# Patient Record
Sex: Female | Born: 1935 | Race: White | Hispanic: No | Smoking: Never smoker
Health system: Southern US, Community
[De-identification: ages and names within clinical notes are randomized; demographics above are authoritative.]

## PROBLEM LIST (undated history)

## (undated) DIAGNOSIS — J3800 Paralysis of vocal cords and larynx, unspecified: Secondary | ICD-10-CM

## (undated) DIAGNOSIS — K219 Gastro-esophageal reflux disease without esophagitis: Secondary | ICD-10-CM

## (undated) DIAGNOSIS — E039 Hypothyroidism, unspecified: Secondary | ICD-10-CM

## (undated) DIAGNOSIS — E063 Autoimmune thyroiditis: Secondary | ICD-10-CM

## (undated) DIAGNOSIS — J449 Chronic obstructive pulmonary disease, unspecified: Secondary | ICD-10-CM

## (undated) DIAGNOSIS — I10 Essential (primary) hypertension: Secondary | ICD-10-CM

## (undated) DIAGNOSIS — F418 Other specified anxiety disorders: Secondary | ICD-10-CM

## (undated) HISTORY — PX: OTHER SURGICAL HISTORY: SHX169

## (undated) HISTORY — PX: APPENDECTOMY: SHX54

## (undated) HISTORY — DX: Autoimmune thyroiditis: E06.3

## (undated) HISTORY — PX: TUBAL LIGATION: SHX77

## (undated) HISTORY — PX: THYROIDECTOMY: SHX17

## (undated) HISTORY — PX: HEMORRHOID SURGERY: SHX153

## (undated) HISTORY — DX: Paralysis of vocal cords and larynx, unspecified: J38.00

---

## 1998-02-26 ENCOUNTER — Other Ambulatory Visit: Admission: RE | Admit: 1998-02-26 | Discharge: 1998-02-26 | Payer: Self-pay | Admitting: Obstetrics and Gynecology

## 1999-03-02 ENCOUNTER — Other Ambulatory Visit: Admission: RE | Admit: 1999-03-02 | Discharge: 1999-03-02 | Payer: Self-pay | Admitting: Obstetrics and Gynecology

## 1999-06-29 ENCOUNTER — Ambulatory Visit (HOSPITAL_COMMUNITY): Admission: RE | Admit: 1999-06-29 | Discharge: 1999-06-29 | Payer: Self-pay | Admitting: Internal Medicine

## 1999-06-29 ENCOUNTER — Encounter: Payer: Self-pay | Admitting: Internal Medicine

## 2000-02-04 ENCOUNTER — Encounter: Payer: Self-pay | Admitting: Internal Medicine

## 2000-02-04 ENCOUNTER — Ambulatory Visit (HOSPITAL_COMMUNITY): Admission: RE | Admit: 2000-02-04 | Discharge: 2000-02-04 | Payer: Self-pay | Admitting: Internal Medicine

## 2000-03-09 ENCOUNTER — Other Ambulatory Visit: Admission: RE | Admit: 2000-03-09 | Discharge: 2000-03-09 | Payer: Self-pay | Admitting: Obstetrics and Gynecology

## 2001-03-08 ENCOUNTER — Other Ambulatory Visit: Admission: RE | Admit: 2001-03-08 | Discharge: 2001-03-08 | Payer: Self-pay | Admitting: Obstetrics and Gynecology

## 2001-03-27 ENCOUNTER — Ambulatory Visit (HOSPITAL_COMMUNITY): Admission: RE | Admit: 2001-03-27 | Discharge: 2001-03-27 | Payer: Self-pay | Admitting: *Deleted

## 2001-03-27 ENCOUNTER — Encounter (INDEPENDENT_AMBULATORY_CARE_PROVIDER_SITE_OTHER): Payer: Self-pay | Admitting: Specialist

## 2001-04-16 ENCOUNTER — Encounter (INDEPENDENT_AMBULATORY_CARE_PROVIDER_SITE_OTHER): Payer: Self-pay | Admitting: Specialist

## 2001-04-16 ENCOUNTER — Ambulatory Visit (HOSPITAL_COMMUNITY): Admission: RE | Admit: 2001-04-16 | Discharge: 2001-04-16 | Payer: Self-pay | Admitting: Obstetrics and Gynecology

## 2002-02-26 ENCOUNTER — Other Ambulatory Visit: Admission: RE | Admit: 2002-02-26 | Discharge: 2002-02-26 | Payer: Self-pay | Admitting: Gynecology

## 2004-03-04 ENCOUNTER — Other Ambulatory Visit: Admission: RE | Admit: 2004-03-04 | Discharge: 2004-03-04 | Payer: Self-pay | Admitting: Gynecology

## 2004-03-08 ENCOUNTER — Encounter: Admission: RE | Admit: 2004-03-08 | Discharge: 2004-03-08 | Payer: Self-pay | Admitting: Gynecology

## 2004-03-30 ENCOUNTER — Encounter: Admission: RE | Admit: 2004-03-30 | Discharge: 2004-03-30 | Payer: Self-pay | Admitting: Gynecology

## 2004-04-28 ENCOUNTER — Ambulatory Visit (HOSPITAL_COMMUNITY): Admission: RE | Admit: 2004-04-28 | Discharge: 2004-04-28 | Payer: Self-pay | Admitting: *Deleted

## 2004-08-31 ENCOUNTER — Ambulatory Visit: Payer: Self-pay | Admitting: Internal Medicine

## 2005-02-14 ENCOUNTER — Ambulatory Visit: Payer: Self-pay | Admitting: Internal Medicine

## 2005-03-16 ENCOUNTER — Ambulatory Visit: Payer: Self-pay | Admitting: Internal Medicine

## 2005-04-05 ENCOUNTER — Encounter: Admission: RE | Admit: 2005-04-05 | Discharge: 2005-04-05 | Payer: Self-pay | Admitting: Gynecology

## 2005-07-14 ENCOUNTER — Ambulatory Visit: Payer: Self-pay | Admitting: Internal Medicine

## 2005-09-08 ENCOUNTER — Ambulatory Visit: Payer: Self-pay | Admitting: Internal Medicine

## 2005-09-27 ENCOUNTER — Ambulatory Visit: Payer: Self-pay | Admitting: Internal Medicine

## 2005-10-09 ENCOUNTER — Ambulatory Visit: Payer: Self-pay | Admitting: Internal Medicine

## 2006-03-19 ENCOUNTER — Ambulatory Visit: Payer: Self-pay | Admitting: Internal Medicine

## 2006-04-03 ENCOUNTER — Other Ambulatory Visit: Admission: RE | Admit: 2006-04-03 | Discharge: 2006-04-03 | Payer: Self-pay | Admitting: Gynecology

## 2006-04-26 ENCOUNTER — Encounter: Admission: RE | Admit: 2006-04-26 | Discharge: 2006-04-26 | Payer: Self-pay | Admitting: Gynecology

## 2006-05-04 ENCOUNTER — Encounter: Admission: RE | Admit: 2006-05-04 | Discharge: 2006-05-04 | Payer: Self-pay | Admitting: Gynecology

## 2006-06-12 ENCOUNTER — Ambulatory Visit: Payer: Self-pay | Admitting: Internal Medicine

## 2006-10-11 ENCOUNTER — Ambulatory Visit: Payer: Self-pay | Admitting: Internal Medicine

## 2006-11-30 ENCOUNTER — Encounter: Admission: RE | Admit: 2006-11-30 | Discharge: 2006-11-30 | Payer: Self-pay | Admitting: Family Medicine

## 2007-01-01 ENCOUNTER — Ambulatory Visit: Payer: Self-pay | Admitting: Internal Medicine

## 2007-01-01 ENCOUNTER — Ambulatory Visit: Payer: Self-pay | Admitting: Cardiology

## 2007-01-01 LAB — CONVERTED CEMR LAB
GFR calc Af Amer: 105 mL/min
GFR calc non Af Amer: 87 mL/min
Potassium: 4 meq/L (ref 3.5–5.1)
Sodium: 139 meq/L (ref 135–145)

## 2007-05-08 ENCOUNTER — Encounter: Admission: RE | Admit: 2007-05-08 | Discharge: 2007-05-08 | Payer: Self-pay | Admitting: Gynecology

## 2007-08-28 ENCOUNTER — Telehealth: Payer: Self-pay | Admitting: Internal Medicine

## 2007-09-10 DIAGNOSIS — J3800 Paralysis of vocal cords and larynx, unspecified: Secondary | ICD-10-CM | POA: Insufficient documentation

## 2007-09-10 DIAGNOSIS — E063 Autoimmune thyroiditis: Secondary | ICD-10-CM | POA: Insufficient documentation

## 2007-09-10 DIAGNOSIS — J158 Pneumonia due to other specified bacteria: Secondary | ICD-10-CM | POA: Insufficient documentation

## 2007-09-10 DIAGNOSIS — J479 Bronchiectasis, uncomplicated: Secondary | ICD-10-CM

## 2007-10-08 ENCOUNTER — Encounter: Payer: Self-pay | Admitting: Internal Medicine

## 2008-05-08 ENCOUNTER — Encounter: Admission: RE | Admit: 2008-05-08 | Discharge: 2008-05-08 | Payer: Self-pay | Admitting: Gynecology

## 2008-07-08 ENCOUNTER — Telehealth (INDEPENDENT_AMBULATORY_CARE_PROVIDER_SITE_OTHER): Payer: Self-pay | Admitting: *Deleted

## 2008-07-11 ENCOUNTER — Encounter: Payer: Self-pay | Admitting: Internal Medicine

## 2008-07-11 ENCOUNTER — Emergency Department (HOSPITAL_COMMUNITY): Admission: EM | Admit: 2008-07-11 | Discharge: 2008-07-11 | Payer: Self-pay | Admitting: Emergency Medicine

## 2008-07-13 ENCOUNTER — Telehealth (INDEPENDENT_AMBULATORY_CARE_PROVIDER_SITE_OTHER): Payer: Self-pay | Admitting: *Deleted

## 2008-08-26 ENCOUNTER — Encounter: Payer: Self-pay | Admitting: Internal Medicine

## 2008-10-01 ENCOUNTER — Ambulatory Visit: Payer: Self-pay | Admitting: Internal Medicine

## 2008-10-23 ENCOUNTER — Telehealth (INDEPENDENT_AMBULATORY_CARE_PROVIDER_SITE_OTHER): Payer: Self-pay | Admitting: *Deleted

## 2008-11-12 ENCOUNTER — Encounter: Admission: RE | Admit: 2008-11-12 | Discharge: 2008-11-12 | Payer: Self-pay | Admitting: Family Medicine

## 2009-02-08 ENCOUNTER — Ambulatory Visit: Payer: Self-pay | Admitting: Internal Medicine

## 2009-02-08 ENCOUNTER — Telehealth (INDEPENDENT_AMBULATORY_CARE_PROVIDER_SITE_OTHER): Payer: Self-pay | Admitting: *Deleted

## 2009-02-15 ENCOUNTER — Telehealth (INDEPENDENT_AMBULATORY_CARE_PROVIDER_SITE_OTHER): Payer: Self-pay | Admitting: *Deleted

## 2009-05-28 ENCOUNTER — Encounter: Admission: RE | Admit: 2009-05-28 | Discharge: 2009-05-28 | Payer: Self-pay | Admitting: Gynecology

## 2009-07-15 ENCOUNTER — Telehealth: Payer: Self-pay | Admitting: Internal Medicine

## 2009-10-04 ENCOUNTER — Ambulatory Visit: Payer: Self-pay | Admitting: Internal Medicine

## 2009-10-11 ENCOUNTER — Telehealth (INDEPENDENT_AMBULATORY_CARE_PROVIDER_SITE_OTHER): Payer: Self-pay | Admitting: *Deleted

## 2010-04-04 ENCOUNTER — Ambulatory Visit: Payer: Self-pay | Admitting: Internal Medicine

## 2010-06-01 ENCOUNTER — Encounter: Payer: Self-pay | Admitting: Internal Medicine

## 2010-06-07 ENCOUNTER — Encounter: Admission: RE | Admit: 2010-06-07 | Discharge: 2010-06-07 | Payer: Self-pay | Admitting: Gynecology

## 2010-07-30 ENCOUNTER — Encounter: Payer: Self-pay | Admitting: Gynecology

## 2010-08-11 NOTE — Assessment & Plan Note (Signed)
Summary: 6 months/apc   Primary Provider/Referring Provider:  [facility]or[facility]  CC:  6 month follow up visit-Increased SOB and Increased weakness-trying to walk more.[name]  History of Present Illness: 02/08/09- Chronic bronchitis, Brochiectasis-MAIC, chronic right vocal cord paralysis 4 days- sore throat, laryngitis, no measured fever, cough productive dark phlegm. Caught from sick daughter. Taking tylenol and Tussionex.  October 04, 2009- Chronic bronchitis, bronchiectasis-MAIC, chronic right vocal cord paralysis Had laryngitis and a couple of colds. She doesn't know when she hd a pneumonia vaccine. She contnues to cough a lot, productive of white to greenish sputum with occasional streak of heme. this is unchanged and not progressive. No large amount of blood in last 6 months. Keeps pains across midthoracic back- not new. Has known osteoporosis. She denies easy choking while eating. denies any awareness of reflux or heartburn. She notes spring pollen rhinitis- postnasal drip with little sneeze. She is walking regularly. Denies dyspnea except on hills and stairs, sometimes still a hotflash. We discussed combination of Atrovent( rarely used rescue) and Spiriva (used daily).  April 04, 2010- Chronic bronchitis, bronchiectasis/ hx MAIC, chronic right vocal cord paralysis CXR- March, 2011- Chronic scarring changes. Started walking again, but heat and humidity have bothered her breathing. She went to an UC over a weekend in August with sore throat and rash, dx'd Strp throat.  The rash may have been from flea bites, while visiting her daughter.Took amoxacillin and prednisone. She asks about vitamins for her immune resistance. She feels her strength hasn't come back and with resumption of walking she notices the dyspnea with exertion. Her primary MD put her on Xanax, used only occasionally for nerves. Only a couple of small hemoptysis episodes since last here.      Preventive Screening-Counseling &  Management  Alcohol-Tobacco     Smoking Status: never     Passive Smoke Exposure: yes     Passive Smoke Counseling: to avoid passive smoke exposure  Current Medications (verified): 1)  Qvar 40 Mcg/act  Aers (Beclomethasone Dipropionate) .... 2 Puffs Two Times A Day 2)  Atrovent Hfa 17 Mcg/act  Aers (Ipratropium Bromide Hfa) .... Inhale 2 Puffs Four Times A Day As Needed 3)  Levothroid 88 Mcg  Tabs (Levothyroxine Sodium) .... Take 1 Tablet By Mouth Once A Day 4)  Spiriva Handihaler 18 Mcg  Caps (Tiotropium Bromide Monohydrate) .... Inhale Contents of 1 Capsule Once A Day 5)  Xanax 0.5 Mg Tabs (Alprazolam) .... Take 1/2 - 1 By Mouth Three Times A Day As Needed 6)  Vitamin D 2000 Unit Tabs (Cholecalciferol) .... Once Daily 7)  Glucosamine-Chondroitin 500-400 Mg  Tabs (Glucosamine-Chondroitin) .... Take 1 Tablet By Mouth Once A Day 8)  Citrical 1200mg  .... Take 1 Tablet By Mouth Once A Day 9)  Centrum Silver   Tabs (Multiple Vitamins-Minerals) .... Take By Mouth 4 Times Weekly 10)  Metamucil .[name].. 1 Tsp Once Daily 11)  Premarin 0.625 Mg/gm  Crea (Estrogens, Conjugated) .... Use Twice A Week 12)  Gabapentin 100 Mg  Caps (Gabapentin) .... Take 2 Tabs By Mouth Three Times A Day 13)  Tylenol .... Take 1 Tab By Mouth Every 8 Hours As Needed 14)  Diovan Hct 320-25 Mg Tabs (Valsartan-Hydrochlorothiazide) .... Take 1 By Mouth Once Daily 15)  Amlodipine Besylate 5 Mg Tabs (Amlodipine Besylate) .... Take 1 By Mouth Once Daily 16)  Cyclobenzaprine Hcl 5 Mg Tabs (Cyclobenzaprine Hcl) .... Take 1-3 Tabs Daily 17)  Boniva 150 Mg Tabs (Ibandronate Sodium) .... Take 1 By Mouth  Weekly 18)  Hydrocodone-Acetaminophen 5-325 Mg Tabs (Hydrocodone-Acetaminophen) .... As Needed 19)  Tylenol Arthritis Pain 650 Mg Cr-Tabs (Acetaminophen) .... Per Bottle  Allergies (verified): 1)  ! 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 hemorrhoidectomy  Family History: Last updated: 2008-10-10 Mother - died rheumatic fever complc Father- died HTN, COPD  Social History: Last updated: 10/10/2008 Patient never smoked. Positive history of passive tobacco smoke exposure. husband and father widowed  Risk Factors: Smoking Status: never (04/04/2010) Passive Smoke Exposure: yes (04/04/2010)  Review of Systems      See HPI       The patient complains of productive cough.  The patient denies shortness of breath with activity, shortness of breath at rest, non-productive cough, coughing up blood, chest pain, irregular heartbeats, acid heartburn, indigestion, loss of appetite, weight change, abdominal pain, difficulty swallowing, sore throat, tooth/dental problems, headaches, nasal congestion/difficulty breathing through nose, sneezing, itching, change in color of mucus, and fever.    Vital Signs:  Patient profile:   77 year old female Height:      63 inches Weight:      115.38 pounds BMI:     20.51 O2 Sat:      94 % on Room air Pulse rate:   79 / minute BP sitting:   110 / 78  (left arm) Cuff size:   regular  Vitals Entered By: Reynaldo Minium CMA (April 04, 2010 10:49 AM)  O2 Flow:  Room air CC: 6 month follow up visit-Increased SOB and Increased weakness-trying to walk more.   Physical Exam  Additional Exam:  General: A/Ox3; pleasant and cooperative, NAD, slim SKIN: no rash, lesions NODES: no lymphadenopathy HEENT: [place]/AT, EOM- WNL, Conjuctivae- clear, PERRLA, TM scarring without obstruction. Right side looks cloudy and retractrd, erythema or fluid., Nose- clear, Throat- clear and wnl, Mallampati II, chronically hoarse/ strained vocal quality without  stridor NECK: Supple w/ fair ROM, JVD- none, normal carotid impulses w/o bruits Thyroid CHEST: few faint scattered crackles, barely audible with no cough while here HEART: RRR, no m/g/r heard ABDOMEN- soft EAV:WUJW, nl pulses, no edema  NEURO: Grossly intact to observation      CXR  Procedure date:  10/04/2009  Findings:      DG CHEST 2 VIEW - [id number]   Clinical Data: Cough, shortness of breath.   CHEST - 2 VIEW   Comparison: 11/12/2008   Findings: Nodular densities are seen throughout the lungs, particularly left lung, likely calcified granulomas.  Scarring noted in the apices.  Heart is upper limits normal in size.  No effusions or acute bony abnormality.   IMPRESSION: Stable chronic changes.   No acute cardiopulmonary disease.   Read By:  Charlett Nose,  M.D.     Released By:  Charlett Nose,  M.D.  _____________________________________________________________________  External Attachment:    Type:     Image     Comment:  DG CHEST 2 VIEW - [id number]   Impression & Recommendations:  Problem # 1:  BRONCHIECTASIS (ICD-494.0) Encouraged to keep walking for endurance. We discussed winter weather protections. She had pneumovax last visit and wants Flu vax today. Has not had obvious recurrence of purulent bronchitis. Qvar helps and she needs refill.  Problem # 2:  VOCAL CORD PARALYSIS (ICD-478.30) She is managing this as well  as possible and avoiding frank aspriation. 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  Medications Added to Medication List This Visit: 1)  Xanax 0.5 Mg Tabs (Alprazolam) .... Take 1/2 - 1 by mouth three times a day as needed 2)  Boniva 150 Mg Tabs (Ibandronate sodium) .... Take 1 by mouth weekly 3)  Tussionex Pennkinetic Er 10-8 Mg/72ml Lqcr (Hydrocod polst-chlorphen  polst) .[name].. 1 teaspoon twice daily as needed cough  Other Orders: Future Orders: Flu Vaccine 52yrs + MEDICARE PATIENTS (M5784) ... 04/05/2010 Administration Flu vaccine - MCR (G0008) ... 04/05/2010  Patient Instructions: 1)  Please schedule a follow-up appointment in 6 months. 2)  Refill script for Qvar 40 3)  Flu vax 4)  Keep walking for endurance and strrength Prescriptions: TUSSIONEX PENNKINETIC ER 10-8 MG/5ML LQCR (HYDROCOD POLST-CHLORPHEN POLST) 1 teaspoon twice daily as needed cough  #200 ml x 0   Entered and Authorized by:   Waymon Budge MD   Signed by:   Waymon Budge MD on 04/04/2010   Method used:   Print then Give to Patient   RxID:   [id number] QVAR 40 MCG/ACT  AERS (BECLOMETHASONE DIPROPIONATE) 2 puffs two times a day  #3 x 3   Entered and Authorized by:   Waymon Budge MD   Signed by:   Waymon Budge MD on 04/04/2010   Method used:   Print then Give to Patient   RxID:   [id number]      CXR  Procedure date:  10/04/2009  Findings:      DG CHEST 2 VIEW - [id number]   Clinical Data: Cough, shortness of breath.   CHEST - 2 VIEW   Comparison: 11/12/2008   Findings: Nodular densities are seen throughout the lungs, particularly left lung, likely calcified granulomas.  Scarring noted in the apices.  Heart is upper limits normal in size.  No effusions or acute bony abnormality.   IMPRESSION: Stable chronic changes.   No acute cardiopulmonary disease.   Read By:  Charlett Nose,  M.D.     Released By:  Charlett Nose,  M.D.  _____________________________________________________________________  External Attachment:    Type:     Image     Comment:  DG CHEST 2 VIEW - [id number]   Flu Vaccine Consent Questions     Do you have a history of severe allergic reactions to this vaccine? no    Any prior history of allergic reactions to egg and/or gelatin? no    Do you have a sensitivity to the preservative Thimersol? no    Do you have a past  history of Guillan-Barre Syndrome? no    Do you currently have an acute febrile illness? no    Have you ever had a severe reaction to latex? no    Vaccine information given and explained to patient? yes    Are you currently pregnant? no    Lot Number:[id number]   Exp Date:01/07/2011   Site Given  Left Deltoid IMmedflu Vivianne Spence

## 2010-08-11 NOTE — Progress Notes (Signed)
Summary: rx  Phone Note Call from Patient Call back at Home Phone [phone]   Caller: Patient Call For: Hadiya Spoerl Reason for Call: Talk to Nurse Summary of Call: cough,congestion, cold, no fever, runny nose.  Wants to know if you will call in a zpac & Tussionex cough syrup(she requested refill on this from pharm). [facility] Initial call taken by: Eugene Gavia,  July 15, 2009 8:14 AM  Follow-up for Phone Call        please advise. Allergies: doxycycline. Carron Curie CMA  July 15, 2009 8:38 AM   Additional Follow-up for Phone Call Additional follow up Details #1::        Per CY- zpak, Tussionex  1 tsp two times a day as needed no ref. Zackery Barefoot CMA  July 15, 2009 9:11 AM     New/Updated Medications: Sandria Senter ER 8-10 MG/5ML  LQCR (CHLORPHENIRAMINE-HYDROCODONE) 1 teaspoon twice a day as needed Prescriptions: ZITHROMAX Z-PAK 250 MG TABS (AZITHROMYCIN) 2 today then one daily  #1 pak x 0   Entered by:   Carron Curie CMA   Authorized by:   Waymon Budge MD   Signed by:   Carron Curie CMA on 07/15/2009   Method used:   Telephoned to ...       [id number]       [address]       [place], [place]  [address]       Ph: [phone]       Fax: [phone]   RxID:   [id number] Sandria Senter ER 8-10 MG/5ML  LQCR (CHLORPHENIRAMINE-HYDROCODONE) 1 teaspoon twice a day as needed  #279ml x 0   Entered by:   Carron Curie CMA   Authorized by:   Waymon Budge MD   Signed by:   Carron Curie CMA on 07/15/2009   Method used:   Telephoned to ...       [id number]       [address]       [place], [place]  [address]       Ph: [phone]       Fax: [phone]   RxID:   [id number]

## 2010-08-11 NOTE — Progress Notes (Signed)
Summary: results of cxr  Phone Note Call from Patient Call back at Home Phone [phone]   Caller: Patient Call For: young Reason for Call: Talk to Nurse Summary of Call: pt returning a call to [facility] for results of cxr. Initial call taken by: Eugene Gavia,  October 11, 2009 1:25 PM  Follow-up for Phone Call        called and spoke with pt.  pt aware of cxr results.  Aundra Millet Reynolds LPN  October 11, 5636 1:32 PM

## 2010-08-11 NOTE — Assessment & Plan Note (Signed)
Summary: follow up/apc   Primary Provider/Referring Provider:  Marny Sweeney  CC:  Follow up visit.  History of Present Illness:  01/01/07- HISTORY:  She has been coughing more for the last couple of weeks and Dr. Dorothe Pea had given her Z-Pak at the beginning.  It did not help a lot.  She has felt a little more shortness of breath.  Starting yesterday around 9 p.m. during routine activity she began coughing harder and bringing up frank blood.  She had some left anterior chest pain and mid chest gurgling but no fever.  Bleeding has slowed but not stopped.   10/01/08- Chronic bronchitis, Bronchiectasis-MAIC, chronicRight vocal cord paralysis Did well over past year. Caught chest cold Jan/10, went to ER, told CXR ok XRay Disc reviewed- CXR 07/11/08- Hyperinflation with chronic interstitial changes,NAD c/w 2008. Had both flu shots. Asks ears checked- hearing poor since winter. Hx burst eardrum as baby. Occasionally coughs a little bood- has been associated with her known bronchiectasis. Has chronic.Mild seasonal allergy with rhinorrhea and sneeze.Marland Kitchen Denies chest pain, fever, sweat, adenopathy.  02/08/09- Chronic bronchitis, Brochiectasis-MAIC, chronic right vocal cord paralysis 4 days- sore throat, laryngitis, no measured fever, cough productive dark phlegm. Caught from sick daughter. Taking tylenol and Tussionex.  October 04, 2009- Chronic bronchitis, bronchiectasis-MAIC, chronic right vocal cord paralysis Had laryngitis and a couple of colds. She doesn't know when she hd a pneumonia vaccine. She contnues to cough a lot, productive of white to greenish sputum with occasional streak of heme. this is unchanged and not progressive. No large amount of blood in last 6 months. Keeps pains across midthoracic back- not new. Has known osteoporosis. She denies easy choking while eating. denies any awareness of reflux or heartburn. She notes spring pollen rhinitis- postnasal drip with little sneeze. She is  walking regularly. Denies dyspnea except on hills and stairs, sometimes still a hotflash. We discussed combination of Atrovent( rarely used rescue) and Spiriva (used daily).      Current Medications (verified): 1)  Qvar 40 Mcg/act  Aers (Beclomethasone Dipropionate) .... 2 Puffs Two Times A Day 2)  Atrovent Hfa 17 Mcg/act  Aers (Ipratropium Bromide Hfa) .... Inhale 2 Puffs Four Times A Day As Needed 3)  Levothroid 88 Mcg  Tabs (Levothyroxine Sodium) .... Take 1 Tablet By Mouth Once A Day 4)  Spiriva Handihaler 18 Mcg  Caps (Tiotropium Bromide Monohydrate) .... Inhale Contents of 1 Capsule Once A Day 5)  Diazepam 2 Mg  Tabs (Diazepam) .... Take 1 Tablet By Mouth Three Times A Day As Needed 6)  Vitamin D 2000 Unit Tabs (Cholecalciferol) .... Once Daily 7)  Glucosamine-Chondroitin 500-400 Mg  Tabs (Glucosamine-Chondroitin) .... Take 1 Tablet By Mouth Once A Day 8)  Citrical 1200mg  .... Take 1 Tablet By Mouth Once A Day 9)  Centrum Silver   Tabs (Multiple Vitamins-Minerals) .... Take By Mouth 4 Times Weekly 10)  Metamucil .Marland Kitchen.. 1 Tsp Once Daily 11)  Premarin 0.625 Mg/gm  Crea (Estrogens, Conjugated) .... Use Twice A Week 12)  Gabapentin 100 Mg  Caps (Gabapentin) .... Take 2 Tabs By Mouth Three Times A Day 13)  Tylenol .... Take 1 Tab By Mouth Every 8 Hours As Needed 14)  Diovan Hct 320-25 Mg Tabs (Valsartan-Hydrochlorothiazide) .... Take 1 By Mouth Once Daily 15)  Amlodipine Besylate 5 Mg Tabs (Amlodipine Besylate) .... Take 1 By Mouth Once Daily 16)  Cyclobenzaprine Hcl 5 Mg Tabs (Cyclobenzaprine Hcl) .... Take 1-3 Tabs Daily 17)  Actonel 150 Mg Tabs (Risedronate  Sodium) .... Take 1 Once Month 18)  Hydrocodone-Acetaminophen 5-325 Mg Tabs (Hydrocodone-Acetaminophen) .... As Needed 19)  Tylenol Arthritis Pain 650 Mg Cr-Tabs (Acetaminophen) .... Per Bottle  Allergies (verified): 1)  ! Doxycycline  Past History:  Past Surgical History: Last updated: 10/29/08 Thyroidectomy Tubal  ligation Appendectomy Hysteroscopy twice Recocoel anc cystocoel repair. hemorrhoidectomy  Family History: Last updated: 10/29/2008 Mother - died rheumatic fever complc Father- died HTN, COPD  Social History: Last updated: October 29, 2008 Patient never smoked. Positive history of passive tobacco smoke exposure. 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  The patient denies anorexia, fever, weight loss, weight gain, vision loss, decreased hearing, hoarseness, chest pain, syncope, peripheral edema, headaches, abdominal pain, melena, and severe indigestion/heartburn.    Vital Signs:  Patient profile:   77 year old female Height:      63 inches Weight:      118 pounds BMI:     20.98 O2 Sat:      97 % on Room air Pulse rate:   79 / minute BP sitting:   122 / 70  (left arm) Cuff size:   regular  Vitals Entered By: Reynaldo Minium CMA (October 04, 2009 9:15 AM)  O2 Flow:  Room air  Physical Exam  Additional Exam:  General: A/Ox3; pleasant and cooperative, NAD, slim SKIN: no rash, lesions NODES: no lymphadenopathy HEENT: [place]/AT, EOM- WNL, Conjuctivae- clear, PERRLA, TM scarring without obstruction, erythema or fluid., Nose- clear, Throat- clear and wnl, Mallampati II, chronically hoarse/ strained vocal quality without stridor NECK: Supple w/ fair ROM, JVD- none, normal carotid impulses w/o bruits Thyroid CHEST: few faint scattered crackles, barely audible with no cough while here HEART: RRR, no m/g/r heard ABDOMEN- soft ZOX:WRUE, nl pulses, no edema  NEURO: Grossly intact to observation      Impression &  Recommendations:  Problem # 1:  BRONCHIECTASIS (ICD-494.0) Chronic bronchits. We will update CXR for status of her old MAIC, but clinically she hasn't changed much. We expect a little low grade hemorrhagic bronchits and blood streaking.  Problem # 2:  VOCAL CORD PARALYSIS (ICD-478.30)  She doesn't seem to be aspirating on a scale where she recognizes it.   Other Orders: Est. Patient Level III (45409) T-2 View CXR (71020TC) Pneumococcal Vaccine (81191) Admin 1st Vaccine (47829)  Patient Instructions: 1)  Please schedule a follow-up appointment in 6 months. 2)  Pneumonia vaccine booster 3)  A chest x-ray has been recommended.  Your imaging study may require preauthorization.  4)  Script refills printed. Prescriptions: SPIRIVA HANDIHALER 18 MCG  CAPS (TIOTROPIUM BROMIDE MONOHYDRATE) Inhale contents of 1 capsule once a day  #90 x 3   Entered and Authorized by:   Waymon Budge MD   Signed by:   Waymon Budge MD on 10/04/2009   Method used:   Print then Give to Patient   RxID:   [id number] ATROVENT HFA 17 MCG/ACT  AERS (IPRATROPIUM BROMIDE HFA) inhale 2 puffs four times a day as needed  #3 x 3   Entered and Authorized by:   Waymon Budge MD   Signed by:   Waymon Budge MD on 10/04/2009   Method used:  Print then Give to Patient   RxID:   [id number] QVAR 40 MCG/ACT  AERS (BECLOMETHASONE DIPROPIONATE) 2 puffs two times a day  #3 x 3   Entered and Authorized by:   Waymon Budge MD   Signed by:   Waymon Budge MD on 10/04/2009   Method used:   Print then Give to Patient   RxID:   [id number]    Immunizations Administered:  Pneumonia Vaccine:    Vaccine Type: Pneumovax    Site: left deltoid    Mfr: Merck    Dose: 0.5 ml    Route: IM    Given by: Arloa Koh    Exp. Date: 02/21/2011    Lot #: [id number]    VIS given: 02/05/96 version given October 04, 2009.

## 2010-10-03 ENCOUNTER — Ambulatory Visit (INDEPENDENT_AMBULATORY_CARE_PROVIDER_SITE_OTHER): Payer: Medicare Other | Admitting: Internal Medicine

## 2010-10-03 ENCOUNTER — Encounter: Payer: Self-pay | Admitting: Internal Medicine

## 2010-10-03 ENCOUNTER — Ambulatory Visit (INDEPENDENT_AMBULATORY_CARE_PROVIDER_SITE_OTHER)
Admission: RE | Admit: 2010-10-03 | Discharge: 2010-10-03 | Disposition: A | Discharge status: Home / Self Care | Payer: Self-pay | Source: Ambulatory Visit | Attending: Internal Medicine | Admitting: Internal Medicine

## 2010-10-03 VITALS — BP 122/80 | HR 75 | Ht 63.0 in | Wt 113.0 lb

## 2010-10-03 DIAGNOSIS — J479 Bronchiectasis, uncomplicated: Secondary | ICD-10-CM

## 2010-10-03 DIAGNOSIS — J3800 Paralysis of vocal cords and larynx, unspecified: Secondary | ICD-10-CM

## 2010-10-03 DIAGNOSIS — J158 Pneumonia due to other specified bacteria: Secondary | ICD-10-CM

## 2010-10-03 MED ORDER — BECLOMETHASONE DIPROPIONATE 40 MCG/ACT IN AERS: 2.0000 | INHALATION_SPRAY | Freq: Two times a day (BID) | RESPIRATORY_TRACT | Status: DC

## 2010-10-03 MED ORDER — IPRATROPIUM BROMIDE HFA 17 MCG/ACT IN AERS: 2.0000 | INHALATION_SPRAY | Freq: Four times a day (QID) | RESPIRATORY_TRACT | Status: DC | PRN

## 2010-10-03 MED ORDER — TIOTROPIUM BROMIDE MONOHYDRATE 18 MCG IN CAPS: 18.0000 ug | ORAL_CAPSULE | Freq: Every day | RESPIRATORY_TRACT | Status: DC

## 2010-10-03 NOTE — Patient Instructions (Signed)
Refill scripts for Qvar, Spiriva and Atrovent HFA rescue inhaler  Order- lab- Sputum for AFB smear and culture  Order- CXR PA& LAT for dx bronchiectasis  Order- Refer to [facility] ENT for vocal cord paresis

## 2010-10-03 NOTE — Progress Notes (Signed)
  Subjective:    Patient ID: [name], female    DOB: 06-30-1931, 77 y.o.   MRN: [id number]  HPI 72 yoF never smoker followed here for bronchiectasis, complicated by chronic vocal cord paresis, with hx MAIC- treated. Last here April 04, 2010. Had 2 episodes of laryngitis. Has had at least 2 x pneumovax. She now reports feeling well and doing well. Voice is always weak and hoarse. Denies choke or strangle with meals. Mild pill dysphagia. Denies waking from sleep choking. Does c/o "drainage in throat" when she lies down. Seasonal sneezing. Denies shortness of breath, chest pain or palpitation. Daily productive cough- usually dark, occasionally with streak heme. Has tusionex, used sparingly. Denies fever or night sweats.    Review of Systems     Objective:   Physical Exam General- Alert, Oriented, Affect-appropriate, Distress- none acute  Skin- rash-none, lesions- none, excoriation- none  Lymphadenopathy- none  Head- atraumatic  Eyes- Gross vision intact, PERRLA, conjunctivae clear, secretions  Ears- Normal- Hearing, canals, Tm L ,   R ,  Nose- Clear, Septal dev, mucus, polyps, erosion, perforation   Throat- Mallampati II , mucosa clear , drainage- none, tonsils- atrophic.................always hoarse, no stridor  Neck- flexible , trachea midline, no stridor , thyroid nl, carotid no bruit  Chest - symmetrical excursion , unlabored     Heart/CV- RRR , no murmur , no gallop  , no rub, nl s1 s2                     - JVD- none , edema- none, stasis changes- none, varices- none     Lung- Few bibasilar squeaks,  cough- none , dullness-none, rub- none     Chest wall- atraumatic, no scar  Abd- tender-no, distended-no, bowel sounds-present, HSM- no  Br/ Gen/ Rectal- Not done, not indicated  Extrem- cyanosis- none, clubbing, none, atrophy- none, strength- nl  Neuro- grossly intact to observation        Assessment & Plan:

## 2010-10-03 NOTE — Assessment & Plan Note (Addendum)
Known bronchiectasis with chronic mild intermittent hemoptysis and chronic productive cough.  We will update CXR.  Needs meds refilled- reviewed.

## 2010-10-03 NOTE — Assessment & Plan Note (Signed)
She has chronic bronchitis as expected, but not clearly recurrence of MAIC.

## 2010-10-03 NOTE — Assessment & Plan Note (Signed)
She is shy about the effort of getting to voice clinic at [facility], but hasn't seen an ENT in years and is receptive to suggestion that she establish with a good ENT locally .

## 2010-10-04 ENCOUNTER — Encounter: Payer: Self-pay | Admitting: Internal Medicine

## 2010-10-04 ENCOUNTER — Telehealth: Payer: Self-pay | Admitting: Internal Medicine

## 2010-10-04 NOTE — Telephone Encounter (Signed)
Spoke with patient and she is aware of results-no questions or concerns at this time.

## 2010-10-06 ENCOUNTER — Telehealth: Payer: Self-pay | Admitting: Internal Medicine

## 2010-10-06 ENCOUNTER — Other Ambulatory Visit: Payer: Medicare Other

## 2010-10-06 NOTE — Telephone Encounter (Signed)
Noted by CDY.

## 2010-10-06 NOTE — Telephone Encounter (Signed)
Will forward to Dr. Young as FYI.  

## 2010-10-06 NOTE — Telephone Encounter (Signed)
Pt walked in to leave msg: pt saw dr Jearld Fenton on tues and discussed surgery for voice "repair". Pt is thinking about this and when she makes a decision she will call dr byers. Pt also says that she has dropped off sputum sample in lab. No call back needed per pt unless nurse needs her.

## 2010-11-22 NOTE — Assessment & Plan Note (Signed)
[facility]                                 ON-CALL NOTE   NAME:ENGLISHKiyoko, Mcguirt                      MRN:          [id number]  DATE:12/31/2006                            DOB:          1930-08-29    Ms. Terwilliger calls tonight on December 31, 2006 complaining of hemoptysis.  She is coughing up streaky mucus that is bright red blood. The patient  was instructed to go to the emergency room if it worsens, but otherwise  she is to see Dr. Maple Hudson in the morning in the office.     Charlcie Cradle Delford Field, MD, [facility]  Electronically Signed    PEW/MedQ  DD: 12/31/2006  DT: 01/01/2007  Job #: [id number]   cc:   Joni Fears D. Maple Hudson, MD, FCCP, FACP

## 2010-11-22 NOTE — Assessment & Plan Note (Signed)
[facility]                             PULMONARY OFFICE NOTE   NAME:[name], [name]                      MRN:          [id number]  DATE:01/01/2007                            DOB:          September 25, 1930    PROBLEMS:  1. Chronic vocal cord paresis, thin liquid aspiration.  2. Bronchiectasis.  3. Chronic Mycobacterium avium bronchitis.  4. Intermittent hemoptysis.  5. Hashimoto's thyroiditis.   HISTORY:  She has been coughing more for the last couple of weeks and  Dr. Dorothe Pea had given her Z-Pak at the beginning.  It did not help a  lot.  She has felt a little more shortness of breath.  Starting  yesterday around 9 p.m. during routine activity she began coughing  harder and bringing up frank blood.  She had some left anterior chest  pain and mid chest gurgling but no fever.  Bleeding has slowed but not  stopped.   MEDICATIONS:  1. Levothyroxine 0.88 mg  2. Avalide.  3. Atrovent inhaler.  4. Spiriva.  5. Qvar 40 as two puffs b.i.d.  6. Diazepam 2 mg.  7. Paroxetine 20 mg.  8. Vitamins.  9. Gabapentin 200 mg x2 t.i.d.  10.Tylenol #3 occasionally.   DRUG INTOLERANT TO DOXYCYCLINE BECAUSE OF GI UPSET.   OBJECTIVE:  Chronic hoarseness, faint rhonchi bilaterally but unlabored.  She could take a deep breath without coughing, but shows me some bloody  mucus she had coughed up in a cup before she came.  I found no  adenopathy.  HEART:  Sounds were regular without murmur.   IMPRESSION:  1. Bronchiectasis.  2. Hemorrhagic bronchitis.   PLAN:  1. Avelox 400 mg daily x10 days.  2. CBC with diff.  3. Chemistry panel which returned significant for a carbon dioxide of      33, BUN of 13, creatinine of 0.7.  She was sent for a CT of the      chest and preliminary report describes extensive bronchiectasis      with endobronchial debris in the left lower lung zone and some      adjacent air space disease suspicious for pneumonia or hemorrhage.  4. She  is to take the Avelox, go to bed rest, and call me in the      morning.  She has a scheduled appointment in October, but I suspect      that I will need to see her much sooner unless this clears      completely.     Clinton D. Maple Hudson, MD, [facility] Bollman, FACP  Electronically Signed    CDY/MedQ  DD: 01/01/2007  DT: 01/02/2007  Job #: [id number]   cc:   Jethro Bastos, M.D.

## 2010-11-25 NOTE — Op Note (Signed)
[facility]  Patient:    [name], [name] Visit Number: [id number] MRN: [id number]          Service Type: DSU Location: [facility] Attending Physician:  Amanda Cockayne Dictated by:   Esmeralda Arthur, M.D. Proc. Date: 04/16/01 Admit Date:  04/16/2001                             Operative Report  SURGEON:                      Esmeralda Arthur, M.D.  ANESTHESIA:                   Spinal.  PACKS:                        None.  CATHETERS:                    None.  MEDIUM:                       Sorbitol.  DEFICIT:                      Zero.  FINDINGS:                     The uterus sounded 7 cm.  She had marked cervical stenosis.  Seen during hysteroscopy seemed to have several polyps; one was at the top of the cervix.  She seemed to have a thickened lining.  She is on Premarin and Prometrium.  I think I perforated the uterus on her left lateral side, and no problems. We did not use hysteroscope after we perforated.  Hysteroscopy was done before excoriation and perforation.  DESCRIPTION OF OPERATION:     The patient was carried to the operating room. She was placed in the sitting position and the spinal was administered.  She was then placed in the lithotomy position and her feet were placed in the stirrups; she was prepped and draped in sterile field.  The bladder was emptied by catheterization.  Examination revealed the uterus to be anterior and no masses felt in the adnexa.  While speculum was placed in the posterior vagina, cervix was grasped with a tenaculum and there was no opening I really identified.  I then used the hemostat to open the cervical stenosis.  Once this was opened we used the small dilator to sound the uterus, and which seemed to perforate.  We then sounded the uterus 7 cm.  We then dilated the ureters to #23 and inserted the observation scope.  Sorbitol was the medium; inserted and took a wire up to focus,  and then we could see that it seemed to have polyp at the top of the cervix.  We then withdrew the scope, took the ______ grasping forceps and removed this and reinserted the scope.  She was seen to have a thickened lining and seen to have a polyp over on the right.  We took the sharp curet and curetted the uterus.  I think we perforated on the left lateral side.  We then continued with curetting, with going on a small amount of depth.  I think we removed the polyp in the right lower uterine segment.  We took a moderate amount of tissue and it was sent to  pathology.  We did not look in the uterus again.  The procedure was terminated.  The patient was carried to the recovery room in good condition.    ESTIMATED BLOOD LOSS:  INDICATIONS:  DESCRIPTION OF PROCEDURE: Dictated by:   Esmeralda Arthur, M.D. Attending Physician:  Amanda Cockayne DD:  04/16/01 TD:  04/17/01 Job: [id number]

## 2010-11-25 NOTE — Assessment & Plan Note (Signed)
[facility]                             PULMONARY OFFICE NOTE   NAME:ENGLISHKeiara, [name]                      MRN:          [id number]  DATE:10/11/2006                            DOB:          Mar 27, 1931    PROBLEMS:  1. Chronic vocal cord paresis, thin liquid aspiration.  2. Bronchiectasis.  3. Chronic mycobacterium avium bronchitis.  4. Intermittent hemoptysis.  5. Hashimoto's thyroiditis.   HISTORY:  She has gotten through the winter doing okay. A little nasal  congestion from the pollen now. She is using Tylenol 3 for cough and for  pain of back spasm and asked for refill. Still productive cough most  days, sputum is usually white to a little greenish, no blood, no fever  or chest pain.   MEDICATIONS:  We have reviewed her list which is charted with no  changes.   OBJECTIVE:  Weight 114 pounds, blood pressure 158/82, pulse regular 86,  room air saturation 94%. There is very little chest congestion. Her  chronic hoarseness is unchanged. There is no strider. No adenopathy. No  edema.   IMPRESSION:  Chronic bronchiectasis. She remains at risk of liquid  aspiration and reflux precautions were re-emphasized.   PLAN:  1. For her complaint of muscle spasm, I gave trial of Flexeril 5 mg 1      or 2 t.i.d. p.r.n.  2. We refilled Tussionex.  3. We are scheduling return in about 6 months, earlier p.r.n. We      discussed when to get either a repeat chest x-ray or CAT scan to      track the status of her nodular scarring and bronchiectasis.     Clinton D. Maple Hudson, MD, [facility] Bollman, FACP  Electronically Signed    CDY/MedQ  DD: 10/14/2006  DT: 10/15/2006  Job #: [id number]   cc:   Jethro Bastos, M.D.

## 2010-11-25 NOTE — Assessment & Plan Note (Signed)
[facility]                             PULMONARY OFFICE NOTE   NAME:ENGLISHCorie, Alexis                      MRN:          [id number]  DATE:06/12/2006                            DOB:          1931/01/23    PROBLEM LIST:  1. Chronic vocal cord paralysis, thin liquid aspiration.  2. Bronchiectasis.  3. Chronic Mycobacterium avium bronchitis.  4. Intermittent hemoptysis.  5. Hashimoto thyroiditis.   HISTORY:  She has had flu vaccine and is up to date on her pneumococcal  vaccine.  One week ago she began a stretch of several days with  sustained hemoptysis including some small clots.  This has happened off  and on.  It spontaneously stopped after about 5 days.  She has not had  anything purulent.  No chest pain or palpitations.  She always feels  weak with no energy and has been a bit depressed.  She now feels under  extra stress because she cannot speak very loudly and her husband's  hearing is failing so he cannot hear her.  She had taken two courses of  amoxicillin this summer.  She brings me a copy of a comprehensive  metabolic panel which was normal and says she has had mammograms and  bone density checked.   MEDICATIONS:  We reviewed her list which is charted and is essentially  unchanged except that she has stopped Zoloft and her hormones.   OBJECTIVE:  VITAL SIGNS:  Weight stable 107 pounds, BP 152/102, pulse  regular 83, room air saturation 96%.  GENERAL:  Chronic hoarseness.  No stridor.  No adenopathy.  RESPIRATORY:  There are diffuse faint crackles mainly in the mid zones.  Work of breathing is not increased.  She is not coughing.  HEART:  Sounds are regular without murmur.  There is no cyanosis or  edema.   IMPRESSION:  1. Chronic bronchiectasis with intermittent hemoptysis.  2. Previous Mycobacterium avium bronchitis.  She was unable to      tolerate medications with sustained gastritis after a course of      Biaxin.  Her stomach  finally settled down.   PLAN:  1. Chest x-ray.  2. CBC with diff.  3. Zithromax Z-Pak to hold.  4. Schedule return in 4 months, earlier p.r.n.     Clinton D. Maple Hudson, MD, Ton[facility] Bollman, FACP  Electronically Signed    CDY/MedQ  DD: 06/12/2006  DT: 06/13/2006  Job #: [id number]   cc:   Jethro Bastos, M.D.

## 2010-11-25 NOTE — H&P (Signed)
[facility]  Patient:    [name], [name] Visit Number: [id number] MRN: [id number]          Service Type: Attending:  Esmeralda Arthur, M.D. Dictated by:   Esmeralda Arthur, M.D.                           History and Physical  HISTORY:                      This is a 77 year old female who was seen for a regular check on March 08, 2001.  She said she had been spotting for a few months and she has been taking Premarin 0.625 q.d. and Prometrium q.d.  Her physical was within normal limits.  She had an ultrasound which showed that uterus was 5.8 x 3.8 x 4.4 with a 6 mm myometrium with an anterior fibroid of 1.9 x 2.  Her right ovary was 1.6 x 1.4 x 1.8.  Left ovary was 1.8 x 1 x 1.6. We attempted to do a sonohysterogram but we were unable to put a Pipelle in the cervix to inject the fluid.  After approximately five or six minutes of trying to achieve this, we stopped and we did a D&C.  The patient has been explained the complications of hysteroscopy of endometrial perforation and she will have spotting and maybe some cramps.  The patient presently is taking Synthroid, Maxzide 25 mg.  She uses ______ inhaler and Flovent for emphysema.  She has COPD.  She takes Metamucil every night.  She uses Premarin 0.625 q.d. and is now going to take Prometrium 200 1-12.  She uses Premarin vaginal cream.  REVIEW OF SYSTEMS:            She has spots before her eyes sometimes ______. She has ringing in the ears.  She has dyspnea on exertion which is due to chronic lung.  She has sometimes shortness of breath with chronic cough.  She has bloody stool because of constipation at times.  She has some arthritis which she takes medication.  She currently is on the thyroid.  FAMILY HISTORY:               No family history of colon, ovarian.  She has aunts and cousins that had colon cancer.  PAST SURGICAL HISTORY:        Appendectomy, right ovarian cyst, tubal ligation,  thyroidectomy, hemorrhoids, cystocele, rectocele, bronchoscopy, D&C, and hysteroscopy.  IMPRESSION:                   Irregular spotting postmenopausal, thickened endometrium, hormonal replacement therapy on 0.625 Premarin and Prometrium 1-12, chronic lung disease on thyroid.  DISPOSITION:                  Admit for hysteroscopy, D&C. Dictated by:   Esmeralda Arthur, M.D. Attending:  Esmeralda Arthur, M.D. DD:  04/15/01 TD:  04/15/01 Job: [id number]

## 2010-11-25 NOTE — Assessment & Plan Note (Signed)
[facility]                               PULMONARY OFFICE NOTE   NAME:ENGLISHOletta, [name]                      MRN:          [id number]  DATE:03/19/2006                            DOB:          05/07/1931    PULMONARY FOLLOW-UP:   PROBLEM LIST:  1. Chronic vocal cord paresis/thin liquid aspiration.  2. Bronchiectasis with history of Mycobacterium avium.  3. Intermittent hemoptysis.  4. Thyroiditis.   HISTORY:  She had to stay off of low-dose Biaxin awhile to get over her  gastroenteritis, and she had wanted to defer infectious disease evaluation.  She says the depression and anxiety related to stress of caring for her sick  husband were treated by Dr. Dorothe Pea with Zoloft, which also caused stomach  upset and some headache.  She asks if I would refill diazepam 2 mg, which we  had used for this purpose in the past and also refilled some Tylenol No. 3,  used for cough.  She has had increased shortness of breath, cough and  congestion this summer.  Two weeks ago she coughed up a bloody mucoid sample  that was self-limited.  She has had no fever, chest pain or sweat.  She  feels vague pain she recognized as arthritic at her shoulder blades,  aggravated by twisting and now by exertion.   MEDICATIONS:  We reviewed her list again today.  She continues Atrovent HFA,  Spiriva, Qvar 40 mcg, and other medications as listed.   OBJECTIVE:  VITAL SIGNS:  Weight 108 pounds, BP 132/78, pulse regular at 72,  room air saturation 96%.  HEENT:  Her hoarseness is the same.  There is no stridor, no adenopathy.  CHEST:  I hear a few fine crackles, no rhonchi and no rub.  CARDIAC:  Heart sounds are regular without murmur.  EXTREMITIES:  There is no clubbing or edema.   IMPRESSION:  Chronic bronchiectasis, which partly reflects recurrent low-  grade aspiration and partly reflects chronic Mycobacterium avium infection  for which she has been unable to tolerate therapy  because of  gastrointestinal distress.   PLAN:  1. Refill Tylenol No. 3 q.6h. p.r.n. cough.  2. Refill diazepam 2 mg one q.8h. p.r.n. anxiety, for occasional use.  3. Chest x-ray.  4. Schedule return 4 months, earlier p.r.n.  5. Consider infectious disease and GI referrals either by [place] or by Dr.      Dorothe Pea.                                   Clinton D. Maple Hudson, MD, [facility], FACP   CDY/MedQ  DD:  03/19/2006  DT:  03/20/2006  Job #:  [id number]   cc:   Jethro Bastos, M.D.

## 2010-12-10 ENCOUNTER — Other Ambulatory Visit: Payer: Self-pay | Admitting: Internal Medicine

## 2010-12-19 ENCOUNTER — Telehealth: Payer: Self-pay | Admitting: Internal Medicine

## 2010-12-19 NOTE — Telephone Encounter (Signed)
Called and spoke with pt.  Pt is requesting sputum culture results that were done 3/29.  Please advise.  Thanks.

## 2010-12-19 NOTE — Telephone Encounter (Signed)
I see where AFB smear and culture were ordered, but I don't find result.  Can we check with [place]?

## 2010-12-20 NOTE — Telephone Encounter (Signed)
Patient called back would like a call with the results I advised nurse was calling the lab to have results sent. Patient can be reached at [phone].[name]

## 2010-12-20 NOTE — Telephone Encounter (Signed)
Sputum culture final report is negative for AFB, meaning the atypical AFB/ MAIC germ we treated in the past is not seen now.

## 2010-12-21 NOTE — Telephone Encounter (Signed)
Spoke with patient aware of results.

## 2010-12-26 ENCOUNTER — Encounter: Payer: Self-pay | Admitting: Internal Medicine

## 2011-01-03 ENCOUNTER — Ambulatory Visit: Payer: Self-pay | Admitting: Internal Medicine

## 2011-02-01 ENCOUNTER — Ambulatory Visit (INDEPENDENT_AMBULATORY_CARE_PROVIDER_SITE_OTHER): Payer: Medicare Other | Admitting: Internal Medicine

## 2011-02-01 ENCOUNTER — Encounter: Payer: Self-pay | Admitting: Internal Medicine

## 2011-02-01 VITALS — BP 158/78 | HR 86 | Ht 63.0 in | Wt 111.8 lb

## 2011-02-01 DIAGNOSIS — J479 Bronchiectasis, uncomplicated: Secondary | ICD-10-CM

## 2011-02-01 DIAGNOSIS — J158 Pneumonia due to other specified bacteria: Secondary | ICD-10-CM

## 2011-02-01 DIAGNOSIS — J3800 Paralysis of vocal cords and larynx, unspecified: Secondary | ICD-10-CM

## 2011-02-01 NOTE — Progress Notes (Signed)
Subjective:    Patient ID: [name], female    DOB: 1930/08/27, 77 y.o.   MRN: [id number]  HPI    Review of Systems     Objective:   Physical Exam        Assessment & Plan:   Subjective:    Patient ID: [name], female    DOB: 02-17-1931, 77 y.o.   MRN: [id number]  HPI 10/03/10- 9 yoF never smoker followed here for bronchiectasis, complicated by chronic vocal cord paresis, with hx MAIC- treated. Last here April 04, 2010. Had 2 episodes of laryngitis. Has had at least 2 x pneumovax. She now reports feeling well and doing well. Voice is always weak and hoarse. Denies choke or strangle with meals. Mild pill dysphagia. Denies waking from sleep choking. Does c/o "drainage in throat" when she lies down. Seasonal sneezing. Denies shortness of breath, chest pain or palpitation. Daily productive cough- usually dark, occasionally with streak heme. Has tusionex, used sparingly. Denies fever or night sweats.   02/01/11- 62 yoF never smoker followed here for bronchiectasis, complicated by chronic vocal cord paresis, with hx MAIC- treated.  She indicates she is doing ok. Shows me her current meds - Qvar 40, Atrovent HFA, Spiriva.  Saw Dr Jearld Fenton ENT- he discussed putting stent in vocal cord, but she says she has had this voice 30 years (1978- had vocal cord nodules- surgery damaged nerve) and is wary of possibly losing voice if surgery went wrong. Discussed long term considerations of cough and airway protection, availability of second opinion. She is going back for laryngoscopy. Breathing ok except hot weather drives her indoors. Coughing less.  CXR 10/03/10- Stable bronchiectasis back to 2008, NAD.  Sputum cx for AFB negative.    Review of Systems Constitutional:   No-   weight loss, night sweats, fevers, chills, fatigue, lassitude. HEENT:   No-   headaches, difficulty swallowing, tooth/dental problems, sore throat,                  No-   sneezing, itching, ear ache, nasal  congestion, post nasal drip,   CV:  No-   chest pain, orthopnea, PND, swelling in lower extremities, anasarca, dizziness, palpitations  GI:  No-   heartburn, indigestion, abdominal pain, nausea, vomiting, diarrhea,                 change in bowel habits, loss of appetite  Resp: No- acute shortness of breath with exertion or at rest.  No-  excess mucus,               No-  coughing up of blood.              No-   change in color of mucus.  No- wheezing.    Skin: No-   rash or lesions.  GU: No-   dysuria, change in color of urine, no urgency or frequency.  No- flank pain.  MS:  No-   joint pain or swelling.  No- decreased range of motion.  No- back pain.  Psych:  No- change in mood or affect. No depression or anxiety.  No memory loss.      Objective:   Physical Exam General- Alert, Oriented, Affect-appropriate, Distress- none acute  slender Skin- rash-none, lesions- none, excoriation- none Lymphadenopathy- none Head- atraumatic            Eyes- Gross vision intact, PERRLA, conjunctivae clear secretions  Ears- Hearing, canals normal            Nose- Clear, No-Septal dev, mucus, polyps, erosion, perforation             Throat- Mallampati II , mucosa clear , drainage- none, tonsils- atrophic            +chronic hoarseness Neck- flexible , trachea midline, no stridor , thyroid nl, carotid no bruit Chest - symmetrical excursion , unlabored           Heart/CV- RRR , no murmur , no gallop  , no rub, nl s1 s2                           - JVD- none , edema- none, stasis changes- none, varices- none           Lung- Few basilar crackles, otherwise clear to P&A, wheeze- none, cough- none , dullness-none, rub- none           Chest wall-  Abd- tender-no, distended-no, bowel sounds-present, HSM- no Br/ Gen/ Rectal- Not done, not indicated Extrem- cyanosis- none, clubbing, none, atrophy- none, strength- nl Neuro- grossly intact to observation         Assessment & Plan:

## 2011-02-01 NOTE — Assessment & Plan Note (Signed)
I think she is medically strong enough  To have vocal cord stent surgery if she decides to do it. I did mention availability of second surgical opinion at [facility] if she wishes.

## 2011-02-01 NOTE — Patient Instructions (Signed)
Please call as needed 

## 2011-02-01 NOTE — Assessment & Plan Note (Signed)
Cultures remain negative for recurrence.

## 2011-02-01 NOTE — Assessment & Plan Note (Signed)
Stable bronchiectasis. Our long term concern is that she will aspirate and make this worse.  She had long exposure to second hand smoke from her husband before he died of COPD.

## 2011-02-04 ENCOUNTER — Encounter: Payer: Self-pay | Admitting: Internal Medicine

## 2011-04-24 ENCOUNTER — Other Ambulatory Visit: Payer: Self-pay | Admitting: Gynecology

## 2011-04-24 DIAGNOSIS — Z1231 Encounter for screening mammogram for malignant neoplasm of breast: Secondary | ICD-10-CM

## 2011-05-30 ENCOUNTER — Other Ambulatory Visit: Payer: Self-pay | Admitting: Family Medicine

## 2011-05-30 DIAGNOSIS — M545 Low back pain: Secondary | ICD-10-CM

## 2011-06-02 ENCOUNTER — Other Ambulatory Visit: Payer: Medicare Other

## 2011-06-06 ENCOUNTER — Ambulatory Visit
Admission: RE | Admit: 2011-06-06 | Discharge: 2011-06-06 | Disposition: A | Discharge status: Home / Self Care | Payer: Medicare Other | Source: Ambulatory Visit | Attending: Family Medicine | Admitting: Family Medicine

## 2011-06-06 DIAGNOSIS — M545 Low back pain: Secondary | ICD-10-CM

## 2011-06-13 ENCOUNTER — Ambulatory Visit: Payer: Medicare Other

## 2011-06-15 ENCOUNTER — Ambulatory Visit: Payer: Medicare Other

## 2011-07-07 ENCOUNTER — Ambulatory Visit
Admission: RE | Admit: 2011-07-07 | Discharge: 2011-07-07 | Disposition: A | Discharge status: Home / Self Care | Payer: Medicare Other | Source: Ambulatory Visit | Attending: Gynecology | Admitting: Gynecology

## 2011-07-07 DIAGNOSIS — Z1231 Encounter for screening mammogram for malignant neoplasm of breast: Secondary | ICD-10-CM

## 2011-07-13 DIAGNOSIS — E039 Hypothyroidism, unspecified: Secondary | ICD-10-CM | POA: Diagnosis not present

## 2011-07-13 DIAGNOSIS — I10 Essential (primary) hypertension: Secondary | ICD-10-CM | POA: Diagnosis not present

## 2011-07-13 DIAGNOSIS — F329 Major depressive disorder, single episode, unspecified: Secondary | ICD-10-CM | POA: Diagnosis not present

## 2011-07-13 DIAGNOSIS — F411 Generalized anxiety disorder: Secondary | ICD-10-CM | POA: Diagnosis not present

## 2011-07-20 DIAGNOSIS — IMO0002 Reserved for concepts with insufficient information to code with codable children: Secondary | ICD-10-CM | POA: Diagnosis not present

## 2011-07-20 DIAGNOSIS — M48061 Spinal stenosis, lumbar region without neurogenic claudication: Secondary | ICD-10-CM | POA: Diagnosis not present

## 2011-07-20 DIAGNOSIS — M47817 Spondylosis without myelopathy or radiculopathy, lumbosacral region: Secondary | ICD-10-CM | POA: Diagnosis not present

## 2011-07-25 DIAGNOSIS — E871 Hypo-osmolality and hyponatremia: Secondary | ICD-10-CM | POA: Diagnosis not present

## 2011-08-01 DIAGNOSIS — J069 Acute upper respiratory infection, unspecified: Secondary | ICD-10-CM | POA: Diagnosis not present

## 2011-08-01 DIAGNOSIS — J440 Chronic obstructive pulmonary disease with acute lower respiratory infection: Secondary | ICD-10-CM | POA: Diagnosis not present

## 2011-08-01 DIAGNOSIS — F329 Major depressive disorder, single episode, unspecified: Secondary | ICD-10-CM | POA: Diagnosis not present

## 2011-08-09 ENCOUNTER — Ambulatory Visit (INDEPENDENT_AMBULATORY_CARE_PROVIDER_SITE_OTHER): Payer: Medicare Other | Admitting: Internal Medicine

## 2011-08-09 ENCOUNTER — Encounter: Payer: Self-pay | Admitting: Internal Medicine

## 2011-08-09 ENCOUNTER — Ambulatory Visit (INDEPENDENT_AMBULATORY_CARE_PROVIDER_SITE_OTHER)
Admission: RE | Admit: 2011-08-09 | Discharge: 2011-08-09 | Disposition: A | Discharge status: Home / Self Care | Payer: Medicare Other | Source: Ambulatory Visit | Attending: Internal Medicine | Admitting: Internal Medicine

## 2011-08-09 DIAGNOSIS — J449 Chronic obstructive pulmonary disease, unspecified: Secondary | ICD-10-CM | POA: Diagnosis not present

## 2011-08-09 DIAGNOSIS — J3800 Paralysis of vocal cords and larynx, unspecified: Secondary | ICD-10-CM

## 2011-08-09 DIAGNOSIS — J479 Bronchiectasis, uncomplicated: Secondary | ICD-10-CM | POA: Diagnosis not present

## 2011-08-09 DIAGNOSIS — I10 Essential (primary) hypertension: Secondary | ICD-10-CM | POA: Diagnosis not present

## 2011-08-09 NOTE — Assessment & Plan Note (Signed)
It sounds as if she had a viral bronchitis syndrome earlier this month and is recovering. The underlying scarring and bronchiectasis that was associated with her Mycobacterium avium infection has been clinically stable. I'm concerned about her description of labile blood pressure although some of it may have been anxiety she was taken prednisone. She needs to follow up with her primary physician for this issue. Plan-chest x-ray. Consider update PFT.

## 2011-08-09 NOTE — Patient Instructions (Signed)
Order- CXR  Dx  Bronchiectasis

## 2011-08-09 NOTE — Progress Notes (Signed)
Patient ID: [name], female    DOB: February 19, 1931, 78 y.o.   MRN: [id number]  HPI 10/03/10- 78 yoF never smoker followed here for bronchiectasis, complicated by chronic vocal cord paresis, with hx MAIC- treated. Last here April 04, 2010. Had 2 episodes of laryngitis. Has had at least 2 x pneumovax. She now reports feeling well and doing well. Voice is always weak and hoarse. Denies choke or strangle with meals. Mild pill dysphagia. Denies waking from sleep choking. Does c/o "drainage in throat" when she lies down. Seasonal sneezing. Denies shortness of breath, chest pain or palpitation. Daily productive cough- usually dark, occasionally with streak heme. Has tusionex, used sparingly. Denies fever or night sweats.   02/01/11- 78 yoF never smoker followed here for bronchiectasis, complicated by chronic vocal cord paresis, with hx MAIC- treated.  She indicates she is doing ok. Shows me her current meds - Qvar 40, Atrovent HFA, Spiriva.  Saw Dr Jearld Fenton ENT- he discussed putting stent in vocal cord, but she says she has had this voice 30 years (1978- had vocal cord nodules- surgery damaged nerve) and is wary of possibly losing voice if surgery went wrong. Discussed long term considerations of cough and airway protection, availability of second opinion. She is going back for laryngoscopy. Breathing ok except hot weather drives her indoors. Coughing less.  CXR 10/03/10- Stable bronchiectasis back to 2008, NAD.  Sputum cx for AFB negative.   08/09/11- 78 yoF never smoker followed here for bronchiectasis, complicated by chronic vocal cord paresis, with hx MAIC- treated. Daughter here today [name] gives me a somewhat rambling account of problems she's had to this fall and winter. Back pain was treated with steroid injection and some benefit but while she was hurting it took her breath. Narcotic pain medicines made her nauseated in December and January and she lost weight then. She saw Dr. Isaias Cowman for  followup to discuss what he might offer for surgical treatment of her chronic cord paresis. She decided to defer any intervention. In mid January she had an acute respiratory illness with sore throat for 3 days but no fever. Her primary physician treated with prednisone which made her very agitated, and a Z-Pak. In the last day or 2 her blood pressure has been labile if recorded correctly, down to 50 systolic and up to 160. She doesn't feel irregular palpitation and has not had syncope but she dropped off her blood pressure medicine for the last 2 days.   ROS-see HPI Constitutional:   No-   weight loss, night sweats, fevers, chills, fatigue, lassitude. HEENT:   No-  headaches, difficulty swallowing, tooth/dental problems, sore throat,       No-  sneezing, itching, ear ache, nasal congestion, post nasal drip,  CV:  No-   chest pain, orthopnea, PND, swelling in lower extremities, anasarca,  dizziness, palpitations Resp: +  shortness of breath with exertion or at rest.              Little   productive cough,  No non-productive cough,  No- coughing up of blood.              No-   change in color of mucus.  No- wheezing.   Skin: No-   rash or lesions. GI:  No-   heartburn, indigestion, abdominal pain, nausea, vomiting, diarrhea,                 change in bowel habits, loss of appetite GU: MS:  No-   joint pain or swelling.  No- decreased range of motion.  No- back pain. Neuro-     nothing unusual Psych:  No- change in mood or affect. No depression or anxiety.  No memory loss.      Objective:   Physical Exam General- Alert, Oriented, Affect-appropriate, Distress- none acute  Slender, talkative, BP 102/62,  Skin- rash-none, lesions- none, excoriation- none Lymphadenopathy- none Head- atraumatic            Eyes- Gross vision intact, PERRLA, conjunctivae clear secretions            Ears- Hearing, canals normal            Nose- Clear, No-Septal dev, mucus, polyps, erosion, perforation              Throat- Mallampati II , mucosa clear , drainage- none, tonsils- atrophy +chronic hoarseness Neck- flexible , trachea midline, no stridor , thyroid nl, carotid no bruit Chest - symmetrical excursion , unlabored           Heart/CV- RRR now , no murmur , no gallop  , no rub, nl s1 s2                           - JVD- none , edema- none, stasis changes- none, varices- none           Lung- Few basilar crackles  R>L, otherwise clear to P&A, wheeze- none, cough- none , dullness-none, rub- none           Chest wall-  Abd-  Br/ Gen/ Rectal- Not done, not indicated Extrem- cyanosis- none, clubbing, none, atrophy- none, strength- nl Neuro- grossly intact to observation

## 2011-08-09 NOTE — Assessment & Plan Note (Signed)
She has seen Dr. Isaias Cowman but does not want surgical intervention. She does not seem to be aspirating and her voice quality has not changed.

## 2011-08-18 NOTE — Progress Notes (Signed)
Quick Note:  LMTCB ______ 

## 2011-08-22 ENCOUNTER — Telehealth: Payer: Self-pay | Admitting: Internal Medicine

## 2011-08-22 NOTE — Telephone Encounter (Signed)
Pt informed of cxr results per Dr Young 

## 2011-09-25 DIAGNOSIS — M25519 Pain in unspecified shoulder: Secondary | ICD-10-CM | POA: Diagnosis not present

## 2011-09-25 DIAGNOSIS — M545 Low back pain, unspecified: Secondary | ICD-10-CM | POA: Diagnosis not present

## 2011-09-25 DIAGNOSIS — M47817 Spondylosis without myelopathy or radiculopathy, lumbosacral region: Secondary | ICD-10-CM | POA: Diagnosis not present

## 2011-09-26 DIAGNOSIS — M47817 Spondylosis without myelopathy or radiculopathy, lumbosacral region: Secondary | ICD-10-CM | POA: Diagnosis not present

## 2011-09-26 DIAGNOSIS — M545 Low back pain: Secondary | ICD-10-CM | POA: Diagnosis not present

## 2011-09-28 DIAGNOSIS — H2510 Age-related nuclear cataract, unspecified eye: Secondary | ICD-10-CM | POA: Diagnosis not present

## 2011-09-28 DIAGNOSIS — H21269 Iris atrophy (essential) (progressive), unspecified eye: Secondary | ICD-10-CM | POA: Diagnosis not present

## 2011-10-05 ENCOUNTER — Other Ambulatory Visit: Payer: Self-pay | Admitting: Internal Medicine

## 2011-10-11 ENCOUNTER — Telehealth: Payer: Self-pay | Admitting: Internal Medicine

## 2011-10-11 MED ORDER — BECLOMETHASONE DIPROPIONATE 40 MCG/ACT IN AERS: 2.0000 | INHALATION_SPRAY | Freq: Two times a day (BID) | RESPIRATORY_TRACT | Status: DC

## 2011-10-11 NOTE — Telephone Encounter (Signed)
I spoke with pt and she stated she needed [facility]. I have sent rx and nothing further was needed

## 2011-11-07 ENCOUNTER — Ambulatory Visit: Payer: Medicare Other | Admitting: Internal Medicine

## 2011-11-08 DIAGNOSIS — Z23 Encounter for immunization: Secondary | ICD-10-CM | POA: Diagnosis not present

## 2011-11-08 DIAGNOSIS — R300 Dysuria: Secondary | ICD-10-CM | POA: Diagnosis not present

## 2011-11-08 DIAGNOSIS — I10 Essential (primary) hypertension: Secondary | ICD-10-CM | POA: Diagnosis not present

## 2011-11-08 DIAGNOSIS — M549 Dorsalgia, unspecified: Secondary | ICD-10-CM | POA: Diagnosis not present

## 2011-11-14 DIAGNOSIS — H18419 Arcus senilis, unspecified eye: Secondary | ICD-10-CM | POA: Diagnosis not present

## 2011-11-14 DIAGNOSIS — H02839 Dermatochalasis of unspecified eye, unspecified eyelid: Secondary | ICD-10-CM | POA: Diagnosis not present

## 2011-11-14 DIAGNOSIS — H2510 Age-related nuclear cataract, unspecified eye: Secondary | ICD-10-CM | POA: Diagnosis not present

## 2011-11-23 ENCOUNTER — Encounter: Payer: Self-pay | Admitting: Internal Medicine

## 2011-11-23 ENCOUNTER — Ambulatory Visit (INDEPENDENT_AMBULATORY_CARE_PROVIDER_SITE_OTHER): Payer: Medicare Other | Admitting: Internal Medicine

## 2011-11-23 VITALS — BP 116/62 | HR 77 | Ht 63.0 in | Wt 101.0 lb

## 2011-11-23 DIAGNOSIS — J158 Pneumonia due to other specified bacteria: Secondary | ICD-10-CM | POA: Diagnosis not present

## 2011-11-23 DIAGNOSIS — J479 Bronchiectasis, uncomplicated: Secondary | ICD-10-CM | POA: Diagnosis not present

## 2011-11-23 DIAGNOSIS — E46 Unspecified protein-calorie malnutrition: Secondary | ICD-10-CM | POA: Diagnosis not present

## 2011-11-23 DIAGNOSIS — J3800 Paralysis of vocal cords and larynx, unspecified: Secondary | ICD-10-CM

## 2011-11-23 MED ORDER — TIOTROPIUM BROMIDE MONOHYDRATE 18 MCG IN CAPS: 18.0000 ug | ORAL_CAPSULE | Freq: Every day | RESPIRATORY_TRACT | Status: DC

## 2011-11-23 MED ORDER — HYDROCOD POLST-CHLORPHEN POLST 10-8 MG/5ML PO LQCR: 5.0000 mL | Freq: Two times a day (BID) | ORAL | Status: AC | PRN

## 2011-11-23 NOTE — Patient Instructions (Signed)
Refill script for Spiriva to be called to [facility] to try a small glass of wine at dinner as an appetite stimulant  Cough syrup refilled  Dr Docia Chuck might consider megace as an appetite stimulant

## 2011-11-23 NOTE — Progress Notes (Signed)
Patient ID: TAVI GAUGHRAN, female    DOB: 02/01/1931, 78 y.o.   MRN: [id number]  HPI 10/03/10- 60 yoF never smoker followed here for bronchiectasis, complicated by chronic vocal cord paresis, with hx MAIC- treated. Last here April 04, 2010. Had 2 episodes of laryngitis. Has had at least 2 x pneumovax. She now reports feeling well and doing well. Voice is always weak and hoarse. Denies choke or strangle with meals. Mild pill dysphagia. Denies waking from sleep choking. Does c/o "drainage in throat" when she lies down. Seasonal sneezing. Denies shortness of breath, chest pain or palpitation. Daily productive cough- usually dark, occasionally with streak heme. Has tusionex, used sparingly. Denies fever or night sweats.   02/01/11- 59 yoF never smoker followed here for bronchiectasis, complicated by chronic vocal cord paresis, with hx MAIC- treated.  She indicates she is doing ok. Shows me her current meds - Qvar 40, Atrovent HFA, Spiriva.  Saw Dr Jearld Fenton ENT- he discussed putting stent in vocal cord, but she says she has had this voice 30 years (1978- had vocal cord nodules- surgery damaged nerve) and is wary of possibly losing voice if surgery went wrong. Discussed long term considerations of cough and airway protection, availability of second opinion. She is going back for laryngoscopy. Breathing ok except hot weather drives her indoors. Coughing less.  CXR 10/03/10- Stable bronchiectasis back to 2008, NAD.  Sputum cx for AFB negative.   08/09/11- 52 yoF never smoker followed here for bronchiectasis, complicated by chronic vocal cord paresis, with hx MAIC- treated. Daughter here today Mrs. Whelchel gives me a somewhat rambling account of problems she's had to this fall and winter. Back pain was treated with steroid injection and some benefit but while she was hurting it took her breath. Narcotic pain medicines made her nauseated in December and January and she lost weight then. She saw Dr. Isaias Cowman for  followup to discuss what he might offer for surgical treatment of her chronic cord paresis. She decided to defer any intervention. In mid January she had an acute respiratory illness with sore throat for 3 days but no fever. Her primary physician treated with prednisone which made her very agitated, and a Z-Pak. In the last day or 2 her blood pressure has been labile if recorded correctly, down to 50 systolic and up to 160. She doesn't feel irregular palpitation and has not had syncope but she dropped off her blood pressure medicine for the last 2 days.  11/23/11- 47 yoF never smoker followed here for bronchiectasis, complicated by chronic vocal cord paresis, with hx MAIC- treated. Can tell more SOB and wheezing than usual; hoarseness as well Dr Docia Chuck gave Z-Pak and steroids in March for exacerbation of bronchitis.  Occasional night sweats and blood-streaked sputum, not progressive. She asks suggestions for management of muscle spasm. Taking Xanax. CXR 08/22/11 IMPRESSION:  No acute finding. Stable compared to prior examinations.  Original Report Authenticated By: Bernadene Bell. D'ALESSIO, M.D.   ROS-see HPI Constitutional:   No-   weight loss, +night sweats, fevers, chills, fatigue, lassitude. HEENT:   No-  headaches, difficulty swallowing, tooth/dental problems, sore throat, + chronic hoarseness      No-  sneezing, itching, ear ache, nasal congestion, post nasal drip,  CV:  No-   chest pain, orthopnea, PND, swelling in lower extremities, anasarca,  dizziness, palpitations Resp: +  shortness of breath with exertion or at rest.  Little   productive cough,  No non-productive cough,  No- coughing up of blood.              No-   change in color of mucus.  No- wheezing.   Skin: No-   rash or lesions. GI:  No-   heartburn, indigestion, abdominal pain, nausea, vomiting,  GU: MS:  No-   joint pain or swelling. + Back pain   Neuro-     nothing unusual Psych:  No- change in mood or affect. No  depression or anxiety.  No memory loss.      Objective:   Physical Exam General- Alert, Oriented, Affect-appropriate, Distress- none acute  Slender, talkative, BP 102/62,  Skin- rash-none, lesions- none, excoriation- none Lymphadenopathy- none Head- atraumatic            Eyes- Gross vision intact, PERRLA, conjunctivae clear secretions            Ears- Hearing, canals normal            Nose- Clear, No-Septal dev, mucus, polyps, erosion, perforation             Throat- Mallampati II , mucosa clear , drainage- none, tonsils- atrophy +chronic hoarseness Neck- flexible , trachea midline, no stridor , thyroid nl, carotid no bruit Chest - symmetrical excursion , unlabored           Heart/CV- RRR now , no murmur , no gallop  , no rub, nl s1 s2                           - JVD- none , edema- none, stasis changes- none, varices- none           Lung- Squeaks/ crackles  L apex, R base, otherwise clear to P&A, wheeze- none, cough- none , dullness-none, rub- none           Chest wall-  Abd-  Br/ Gen/ Rectal- Not done, not indicated Extrem- cyanosis- none, clubbing, none, atrophy- none, strength- nl Neuro- grossly intact to observation

## 2011-11-28 ENCOUNTER — Encounter: Payer: Self-pay | Admitting: Internal Medicine

## 2011-11-28 DIAGNOSIS — E46 Unspecified protein-calorie malnutrition: Secondary | ICD-10-CM | POA: Insufficient documentation

## 2011-11-28 NOTE — Assessment & Plan Note (Signed)
Exacerbation in March was treated by her primary physician as noted. We discussed medications. Plan-refill cough syrup. Refill Spiriva.

## 2011-11-28 NOTE — Assessment & Plan Note (Signed)
If she produces sputum we will reculture her to see if this has reactivated. Plan occasional chest x-ray.

## 2011-11-28 NOTE — Assessment & Plan Note (Signed)
She is aware her weight is getting too low but she has poor appetite.  plan-try Megace, small glass of wine with supper. Hope to avoid prednisone. She will discuss this with her primary physician

## 2011-11-28 NOTE — Assessment & Plan Note (Signed)
Hoarseness gets a little worse when she is tired but otherwise seems unchanged. She elected not to have surgery done on her vocal cords.

## 2011-12-09 ENCOUNTER — Other Ambulatory Visit: Payer: Self-pay | Admitting: Internal Medicine

## 2011-12-11 ENCOUNTER — Telehealth: Payer: Self-pay | Admitting: Internal Medicine

## 2011-12-11 DIAGNOSIS — J479 Bronchiectasis, uncomplicated: Secondary | ICD-10-CM

## 2011-12-11 MED ORDER — IPRATROPIUM BROMIDE HFA 17 MCG/ACT IN AERS: 2.0000 | INHALATION_SPRAY | Freq: Four times a day (QID) | RESPIRATORY_TRACT | Status: DC | PRN

## 2011-12-11 NOTE — Telephone Encounter (Signed)
Spoke with pt to verify the msg. She states needs refill on atrovent inhaler sent to [facility]. Rx was sent and pt states nothing further needed.

## 2012-01-01 DIAGNOSIS — D235 Other benign neoplasm of skin of trunk: Secondary | ICD-10-CM | POA: Diagnosis not present

## 2012-01-30 ENCOUNTER — Other Ambulatory Visit: Payer: Self-pay | Admitting: *Deleted

## 2012-01-30 ENCOUNTER — Other Ambulatory Visit: Payer: Self-pay | Admitting: Pulmonary Disease

## 2012-01-30 DIAGNOSIS — J479 Bronchiectasis, uncomplicated: Secondary | ICD-10-CM

## 2012-01-30 MED ORDER — TIOTROPIUM BROMIDE MONOHYDRATE 18 MCG IN CAPS: 18.0000 ug | ORAL_CAPSULE | Freq: Every day | RESPIRATORY_TRACT | Status: DC

## 2012-01-30 NOTE — Telephone Encounter (Signed)
Faxed refill request received for Spiriva. Patient last seen 11/23/11. Refill sent to pharmacy.

## 2012-02-08 DIAGNOSIS — R1013 Epigastric pain: Secondary | ICD-10-CM | POA: Diagnosis not present

## 2012-02-08 DIAGNOSIS — I10 Essential (primary) hypertension: Secondary | ICD-10-CM | POA: Diagnosis not present

## 2012-02-08 DIAGNOSIS — F411 Generalized anxiety disorder: Secondary | ICD-10-CM | POA: Diagnosis not present

## 2012-02-08 DIAGNOSIS — E039 Hypothyroidism, unspecified: Secondary | ICD-10-CM | POA: Diagnosis not present

## 2012-02-08 DIAGNOSIS — K3189 Other diseases of stomach and duodenum: Secondary | ICD-10-CM | POA: Diagnosis not present

## 2012-02-08 DIAGNOSIS — R634 Abnormal weight loss: Secondary | ICD-10-CM | POA: Diagnosis not present

## 2012-02-23 DIAGNOSIS — J441 Chronic obstructive pulmonary disease with (acute) exacerbation: Secondary | ICD-10-CM | POA: Diagnosis not present

## 2012-02-29 ENCOUNTER — Encounter: Payer: Self-pay | Admitting: Internal Medicine

## 2012-02-29 ENCOUNTER — Ambulatory Visit (INDEPENDENT_AMBULATORY_CARE_PROVIDER_SITE_OTHER): Payer: Medicare Other | Admitting: Internal Medicine

## 2012-02-29 ENCOUNTER — Ambulatory Visit: Payer: Medicare Other | Admitting: Internal Medicine

## 2012-02-29 VITALS — BP 128/68 | HR 72 | Ht 63.0 in | Wt 98.2 lb

## 2012-02-29 DIAGNOSIS — J158 Pneumonia due to other specified bacteria: Secondary | ICD-10-CM

## 2012-02-29 DIAGNOSIS — J3800 Paralysis of vocal cords and larynx, unspecified: Secondary | ICD-10-CM

## 2012-02-29 DIAGNOSIS — J479 Bronchiectasis, uncomplicated: Secondary | ICD-10-CM | POA: Diagnosis not present

## 2012-02-29 NOTE — Progress Notes (Signed)
Patient ID: [name], female    DOB: [birth year], 78 y.o.   MRN: [id number]  HPI 10/03/10- 75 yoF never smoker followed here for bronchiectasis, complicated by chronic vocal cord paresis, with hx MAIC- treated. Last here April 04, 2010. Had 2 episodes of laryngitis. Has had at least 2 x pneumovax. She now reports feeling well and doing well. Voice is always weak and hoarse. Denies choke or strangle with meals. Mild pill dysphagia. Denies waking from sleep choking. Does c/o "drainage in throat" when she lies down. Seasonal sneezing. Denies shortness of breath, chest pain or palpitation. Daily productive cough- usually dark, occasionally with streak heme. Has tusionex, used sparingly. Denies fever or night sweats.   02/01/11- 13 yoF never smoker followed here for bronchiectasis, complicated by chronic vocal cord paresis, with hx MAIC- treated.  She indicates she is doing ok. Shows me her current meds - Qvar 40, Atrovent HFA, Spiriva.  Saw Dr Jearld Fenton ENT- he discussed putting stent in vocal cord, but she says she has had this voice 30 years (1978- had vocal cord nodules- surgery damaged nerve) and is wary of possibly losing voice if surgery went wrong. Discussed long term considerations of cough and airway protection, availability of second opinion. She is going back for laryngoscopy. Breathing ok except hot weather drives her indoors. Coughing less.  CXR 10/03/10- Stable bronchiectasis back to 2008, NAD.  Sputum cx for AFB negative.   08/09/11- 18 yoF never smoker followed here for bronchiectasis, complicated by chronic vocal cord paresis, with hx MAIC- treated. Daughter here today [name] gives me a somewhat rambling account of problems she's had to this fall and winter. Back pain was treated with steroid injection and some benefit but while she was hurting it took her breath. Narcotic pain medicines made her nauseated in December and January and she lost weight then. She saw Dr. Isaias Cowman for  followup to discuss what he might offer for surgical treatment of her chronic cord paresis. She decided to defer any intervention. In mid January she had an acute respiratory illness with sore throat for 3 days but no fever. Her primary physician treated with prednisone which made her very agitated, and a Z-Pak. In the last day or 2 her blood pressure has been labile if recorded correctly, down to 50 systolic and up to 160. She doesn't feel irregular palpitation and has not had syncope but she dropped off her blood pressure medicine for the last 2 days.  11/23/11- 44 yoF never smoker followed here for bronchiectasis, complicated by chronic vocal cord paresis, with hx MAIC- treated. Can tell more SOB and wheezing than usual; hoarseness as well Dr Docia Chuck gave Z-Pak and steroids in March for exacerbation of bronchitis.  Occasional night sweats and blood-streaked sputum, not progressive. She asks suggestions for management of muscle spasm. Taking Xanax. CXR 08/22/11 IMPRESSION:  No acute finding. Stable compared to prior examinations.  Original Report Authenticated By: Bernadene Bell. D'ALESSIO, M.D.    02/29/12- 54 yoF never smoker followed here for bronchiectasis, complicated by chronic vocal cord paresis, with hx MAIC- treated. SOB getting worse since last Thursday-started getting sick-contacted PCP office and gave Doxycycline and prednsione(completed) 1 week ago had sore throat with no fever. Primary physician started her on prednisone 40 mg daily x7 days, with doxycycline. She has a chronic history of persistent nausea and gas, starting before the doxycycline. She is about back to her respiratory baseline exertional dyspnea, little dry cough.  ROS-see HPI Constitutional:  No-   weight loss, +night sweats, fevers, chills, fatigue, lassitude. HEENT:   No-  headaches, difficulty swallowing, tooth/dental problems, sore throat, + chronic hoarseness      No-  sneezing, itching, ear ache, nasal congestion,  post nasal drip,  CV:  No-   chest pain, orthopnea, PND, swelling in lower extremities, anasarca,  dizziness, palpitations Resp: +  shortness of breath with exertion or at rest.              Little   productive cough,  No non-productive cough,  No- coughing up of blood.              No-   change in color of mucus.  No- wheezing.   Skin: No-   rash or lesions. GI:  No-   heartburn, indigestion, abdominal pain, +nausea, No-vomiting,  GU: MS:  No-   joint pain or swelling. + Back pain   Neuro-     nothing unusual Psych:  No- change in mood or affect. No depression or anxiety.  No memory loss.  Objective:   Physical Exam BP 128/68  Pulse 72  Ht 5\' 3"  (1.6 m)  Wt 98 lb 3.2 oz (44.543 kg)  BMI 17.40 kg/m2  SpO2 98%  General- Alert, Oriented, Affect-appropriate, Distress- none acute  Slender, talkative, Skin- rash-none, lesions- none, excoriation- none Lymphadenopathy- none Head- atraumatic            Eyes- Gross vision intact, PERRLA, conjunctivae clear secretions            Ears- Hearing, canals normal            Nose- Clear, No-Septal dev, mucus, polyps, erosion, perforation             Throat- Mallampati II , mucosa clear , drainage- none, tonsils- atrophy +chronic hoarseness Neck- flexible , trachea midline, no stridor , thyroid nl, carotid no bruit Chest - symmetrical excursion , unlabored           Heart/CV- RRR now , no murmur , no gallop  , no rub, nl s1 s2                           - JVD- none , edema- none, stasis changes- none, varices- none           Lung- + few Squeaks/ crackles- not changed, wheeze- none, cough- none , dullness-none, rub- none           Chest wall-  Abd-  Br/ Gen/ Rectal- Not done, not indicated Extrem- cyanosis- none, clubbing, none, atrophy- none, strength- nl Neuro- grossly intact to observation

## 2012-02-29 NOTE — Patient Instructions (Addendum)
Finish the prednisone and doxycycline that you started. If your chest/ breathing bother you, then please let me know. If your stomach remains uncomfortable, please let your primary doctors know, in case they want you to see a gastroenterologist.

## 2012-03-06 NOTE — Assessment & Plan Note (Signed)
Chronic wets weeks on chest exam reflect her chronic bronchitis/bronchiectasis but without clinical progression. As she resolves recent exacerbation, she will finish her prednisone and doxycycline. If GI discomfort persists then she should ask her primary physician about GI referral since this is a long-standing complaint.Marland Kitchen

## 2012-03-06 NOTE — Assessment & Plan Note (Signed)
Think the current episode has been a nonspecific acute bronchitis . We can continue to watch for relapse

## 2012-03-12 DIAGNOSIS — H2589 Other age-related cataract: Secondary | ICD-10-CM | POA: Diagnosis not present

## 2012-03-18 DIAGNOSIS — F411 Generalized anxiety disorder: Secondary | ICD-10-CM | POA: Diagnosis not present

## 2012-03-18 DIAGNOSIS — F329 Major depressive disorder, single episode, unspecified: Secondary | ICD-10-CM | POA: Diagnosis not present

## 2012-03-18 DIAGNOSIS — R1013 Epigastric pain: Secondary | ICD-10-CM | POA: Diagnosis not present

## 2012-03-18 DIAGNOSIS — R634 Abnormal weight loss: Secondary | ICD-10-CM | POA: Diagnosis not present

## 2012-03-21 ENCOUNTER — Other Ambulatory Visit: Payer: Self-pay | Admitting: Gastroenterology

## 2012-03-21 DIAGNOSIS — R109 Unspecified abdominal pain: Secondary | ICD-10-CM

## 2012-03-21 DIAGNOSIS — R630 Anorexia: Secondary | ICD-10-CM | POA: Diagnosis not present

## 2012-03-21 DIAGNOSIS — R110 Nausea: Secondary | ICD-10-CM | POA: Diagnosis not present

## 2012-03-21 DIAGNOSIS — R1013 Epigastric pain: Secondary | ICD-10-CM | POA: Diagnosis not present

## 2012-03-21 DIAGNOSIS — R634 Abnormal weight loss: Secondary | ICD-10-CM | POA: Diagnosis not present

## 2012-03-27 ENCOUNTER — Other Ambulatory Visit: Payer: Self-pay | Admitting: Gastroenterology

## 2012-03-27 ENCOUNTER — Ambulatory Visit
Admission: RE | Admit: 2012-03-27 | Discharge: 2012-03-27 | Disposition: A | Discharge status: Home / Self Care | Payer: Medicare Other | Source: Ambulatory Visit | Attending: Gastroenterology | Admitting: Gastroenterology

## 2012-03-27 ENCOUNTER — Inpatient Hospital Stay: Admission: RE | Admit: 2012-03-27 | Payer: Medicare Other | Source: Ambulatory Visit

## 2012-03-27 DIAGNOSIS — R109 Unspecified abdominal pain: Secondary | ICD-10-CM | POA: Diagnosis not present

## 2012-03-27 DIAGNOSIS — K5900 Constipation, unspecified: Secondary | ICD-10-CM | POA: Diagnosis not present

## 2012-03-27 DIAGNOSIS — N8111 Cystocele, midline: Secondary | ICD-10-CM | POA: Diagnosis not present

## 2012-03-27 MED ORDER — IOHEXOL 300 MG/ML  SOLN
100.0000 mL | Freq: Once | INTRAMUSCULAR | Status: AC | PRN
  Administered 2012-03-27: 100 mL via INTRAVENOUS

## 2012-03-28 DIAGNOSIS — E875 Hyperkalemia: Secondary | ICD-10-CM | POA: Diagnosis not present

## 2012-04-05 DIAGNOSIS — K449 Diaphragmatic hernia without obstruction or gangrene: Secondary | ICD-10-CM | POA: Diagnosis not present

## 2012-04-05 DIAGNOSIS — R1013 Epigastric pain: Secondary | ICD-10-CM | POA: Diagnosis not present

## 2012-04-05 DIAGNOSIS — R110 Nausea: Secondary | ICD-10-CM | POA: Diagnosis not present

## 2012-04-08 DIAGNOSIS — R1013 Epigastric pain: Secondary | ICD-10-CM | POA: Diagnosis not present

## 2012-04-12 ENCOUNTER — Telehealth: Payer: Self-pay | Admitting: Internal Medicine

## 2012-04-12 NOTE — Telephone Encounter (Signed)
Called, spoke with pt who states she recently had a CT Abd/pelvis ordered by Dr. Bosie Clos.  States on Sept 23 Dr. Bosie Clos was supposed to fax Dr. Maple Hudson about this and possible wanted to speak with him as pt was told there were nodules found in her lungs on this scan.  She would like to know if Dr. Maple Hudson has received fax from Dr. Bosie Clos and if he has spoken with him.  Also, would like to know if Dr. Maple Hudson needs to speak with her based on these results.  Dr. Maple Hudson, pls advise.  Thank you.

## 2012-04-12 NOTE — Telephone Encounter (Signed)
Called, spoke with pt.  Informed her of below per Dr. Maple Hudson.  She is aware of pending OV with Dr. Maple Hudson on Sep 05, 2012 and will call back for an earlier appt if she has an increase in chest symptoms.  She verbalized understanding of results and recs.  Nothing further needed at this time.

## 2012-04-12 NOTE — Telephone Encounter (Signed)
CT abdomen imaged lower lung zones. This shows the same bronchiectasis/ scarring and nodularity that we have been following. i can't tell there is any change from her last CXR. Keep appointment, but come back early if increased chest symptoms.

## 2012-04-23 DIAGNOSIS — Z23 Encounter for immunization: Secondary | ICD-10-CM | POA: Diagnosis not present

## 2012-04-30 DIAGNOSIS — R1013 Epigastric pain: Secondary | ICD-10-CM | POA: Diagnosis not present

## 2012-04-30 DIAGNOSIS — R634 Abnormal weight loss: Secondary | ICD-10-CM | POA: Diagnosis not present

## 2012-05-07 DIAGNOSIS — F411 Generalized anxiety disorder: Secondary | ICD-10-CM | POA: Diagnosis not present

## 2012-05-07 DIAGNOSIS — F329 Major depressive disorder, single episode, unspecified: Secondary | ICD-10-CM | POA: Diagnosis not present

## 2012-05-07 DIAGNOSIS — I10 Essential (primary) hypertension: Secondary | ICD-10-CM | POA: Diagnosis not present

## 2012-05-07 DIAGNOSIS — E039 Hypothyroidism, unspecified: Secondary | ICD-10-CM | POA: Diagnosis not present

## 2012-05-20 ENCOUNTER — Telehealth: Payer: Self-pay | Admitting: Internal Medicine

## 2012-05-20 DIAGNOSIS — J479 Bronchiectasis, uncomplicated: Secondary | ICD-10-CM

## 2012-05-20 MED ORDER — IPRATROPIUM BROMIDE HFA 17 MCG/ACT IN AERS: 2.0000 | INHALATION_SPRAY | Freq: Four times a day (QID) | RESPIRATORY_TRACT | Status: DC | PRN

## 2012-05-20 NOTE — Telephone Encounter (Signed)
Last rx was sent on 12-11-11 for year supply, but pt states when she called in for a refill she was told that this rx was cancelled. I do not see anything in pt chart to states why rx was cancelled and last note does not mention anything either. I advised the pt I will send in Rx. Carron Curie, CMA

## 2012-06-04 ENCOUNTER — Other Ambulatory Visit: Payer: Self-pay | Admitting: Gynecology

## 2012-06-04 DIAGNOSIS — Z1231 Encounter for screening mammogram for malignant neoplasm of breast: Secondary | ICD-10-CM

## 2012-06-10 DIAGNOSIS — J449 Chronic obstructive pulmonary disease, unspecified: Secondary | ICD-10-CM | POA: Diagnosis not present

## 2012-06-10 DIAGNOSIS — Z01419 Encounter for gynecological examination (general) (routine) without abnormal findings: Secondary | ICD-10-CM | POA: Diagnosis not present

## 2012-06-10 DIAGNOSIS — B0089 Other herpesviral infection: Secondary | ICD-10-CM | POA: Diagnosis not present

## 2012-07-08 ENCOUNTER — Ambulatory Visit: Payer: Medicare Other

## 2012-08-12 ENCOUNTER — Ambulatory Visit
Admission: RE | Admit: 2012-08-12 | Discharge: 2012-08-12 | Disposition: A | Discharge status: Home / Self Care | Payer: Medicare Other | Source: Ambulatory Visit | Attending: Gynecology | Admitting: Gynecology

## 2012-08-12 DIAGNOSIS — Z1231 Encounter for screening mammogram for malignant neoplasm of breast: Secondary | ICD-10-CM

## 2012-09-05 ENCOUNTER — Ambulatory Visit: Payer: Medicare Other | Admitting: Internal Medicine

## 2012-09-18 ENCOUNTER — Ambulatory Visit (INDEPENDENT_AMBULATORY_CARE_PROVIDER_SITE_OTHER)
Admission: RE | Admit: 2012-09-18 | Discharge: 2012-09-18 | Disposition: A | Discharge status: Home / Self Care | Payer: Medicare Other | Source: Ambulatory Visit | Attending: Internal Medicine | Admitting: Internal Medicine

## 2012-09-18 ENCOUNTER — Telehealth: Payer: Self-pay | Admitting: Internal Medicine

## 2012-09-18 ENCOUNTER — Encounter: Payer: Self-pay | Admitting: Internal Medicine

## 2012-09-18 ENCOUNTER — Ambulatory Visit (INDEPENDENT_AMBULATORY_CARE_PROVIDER_SITE_OTHER): Payer: Medicare Other | Admitting: Internal Medicine

## 2012-09-18 VITALS — BP 110/80 | HR 80 | Ht 63.0 in | Wt 102.0 lb

## 2012-09-18 DIAGNOSIS — J449 Chronic obstructive pulmonary disease, unspecified: Secondary | ICD-10-CM | POA: Diagnosis not present

## 2012-09-18 DIAGNOSIS — J479 Bronchiectasis, uncomplicated: Secondary | ICD-10-CM

## 2012-09-18 DIAGNOSIS — J158 Pneumonia due to other specified bacteria: Secondary | ICD-10-CM

## 2012-09-18 MED ORDER — TIOTROPIUM BROMIDE MONOHYDRATE 18 MCG IN CAPS: 18.0000 ug | ORAL_CAPSULE | Freq: Every day | RESPIRATORY_TRACT | Status: DC

## 2012-09-18 MED ORDER — BECLOMETHASONE DIPROPIONATE 40 MCG/ACT IN AERS: 2.0000 | INHALATION_SPRAY | Freq: Two times a day (BID) | RESPIRATORY_TRACT | Status: DC

## 2012-09-18 NOTE — Patient Instructions (Addendum)
Try Biotene mouthwash to help with dry mouth  Your inhaled medicines can contribute to dry mouth.    Try using as little of the Atrovent/ ipratropium inhaler as you can manage.   Refill scripts for Qvar 40 and for Spririva  Order - CXR dx chronic bronchiectasis  Walk as much as you can when the weather allows.

## 2012-09-18 NOTE — Telephone Encounter (Signed)
I have faxed the rx's. Nothing more needed at this time.

## 2012-09-18 NOTE — Progress Notes (Signed)
Patient ID: [name], female    DOB: [birth year], 79 y.o.   MRN: [id number]  HPI 10/03/10- 62 yoF never smoker followed here for bronchiectasis, complicated by chronic vocal cord paresis, with hx MAIC- treated. Last here April 04, 2010. Had 2 episodes of laryngitis. Has had at least 2 x pneumovax. She now reports feeling well and doing well. Voice is always weak and hoarse. Denies choke or strangle with meals. Mild pill dysphagia. Denies waking from sleep choking. Does c/o "drainage in throat" when she lies down. Seasonal sneezing. Denies shortness of breath, chest pain or palpitation. Daily productive cough- usually dark, occasionally with streak heme. Has tusionex, used sparingly. Denies fever or night sweats.   02/01/11- 89 yoF never smoker followed here for bronchiectasis, complicated by chronic vocal cord paresis, with hx MAIC- treated.  She indicates she is doing ok. Shows me her current meds - Qvar 40, Atrovent HFA, Spiriva.  Saw Dr Janace Hoard ENT- he discussed putting stent in vocal cord, but she says she has had this voice 30 years (1978- had vocal cord nodules- surgery damaged nerve) and is wary of possibly losing voice if surgery went wrong. Discussed long term considerations of cough and airway protection, availability of second opinion. She is going back for laryngoscopy. Breathing ok except hot weather drives her indoors. Coughing less.  CXR 10/03/10- Stable bronchiectasis back to 2008, NAD.  Sputum cx for AFB negative.   08/09/11- 62 yoF never smoker followed here for bronchiectasis, complicated by chronic vocal cord paresis, with hx MAIC- treated. Daughter here today [name] gives me a somewhat rambling account of problems she's had to this fall and winter. Back pain was treated with steroid injection and some benefit but while she was hurting it took her breath. Narcotic pain medicines made her nauseated in December and January and she lost weight then. She saw Dr. Verita Schneiders for  followup to discuss what he might offer for surgical treatment of her chronic cord paresis. She decided to defer any intervention. In mid January she had an acute respiratory illness with sore throat for 3 days but no fever. Her primary physician treated with prednisone which made her very agitated, and a Z-Pak. In the last day or 2 her blood pressure has been labile if recorded correctly, down to 50 systolic and up to 332. She doesn't feel irregular palpitation and has not had syncope but she dropped off her blood pressure medicine for the last 2 days.  11/23/11- 26 yoF never smoker followed here for bronchiectasis, complicated by chronic vocal cord paresis, with hx MAIC- treated. Can tell more SOB and wheezing than usual; hoarseness as well Dr Dorthy Cooler gave Z-Pak and steroids in March for exacerbation of bronchitis.  Occasional night sweats and blood-streaked sputum, not progressive. She asks suggestions for management of muscle spasm. Taking Xanax. CXR 08/22/11 IMPRESSION:  No acute finding. Stable compared to prior examinations.  Original Report Authenticated By: Arvid Right. D'ALESSIO, M.D.    02/29/12- 62 yoF never smoker followed here for bronchiectasis, complicated by chronic vocal cord paresis, with hx MAIC- treated. SOB getting worse since last Thursday-started getting sick-contacted PCP office and gave Doxycycline and prednsione(completed) 1 week ago had sore throat with no fever. Primary physician started her on prednisone 40 mg daily x7 days, with doxycycline. She has a chronic history of persistent nausea and gas, starting before the doxycycline. She is about back to her respiratory baseline exertional dyspnea, little dry cough.  09/18/12- 82 yoF never  smoker followed here for bronchiectasis, complicated by chronic vocal cord paresis, with hx MAIC- treated. FOLLOWS FOR: gets more SOB than usual. Breathing has not gotten any better since last visit. She tells me reading is not really  different. Occasional coughing spells with no long-term change. Productive of white mucus with no blood. Several episodes of laryngitis this winter. Uses Spiriva and occasionally Atrovent. We discussed that overlap but she does not tolerate stimulants. We discussed her steroid Qvar. She is not walking much outside because of the weather and realizes she loses strength when she can't exercise. She has been told she has a hiatal hernia. We have talked about reflux and aspiration many times. She practices precautions as instructed.  ROS-see HPI Constitutional:   No-   weight loss, +night sweats, fevers, chills, fatigue, lassitude. HEENT:   No-  headaches, difficulty swallowing, tooth/dental problems, sore throat, + chronic hoarseness      No-  sneezing, itching, ear ache, nasal congestion, post nasal drip,  CV:  No-   chest pain, orthopnea, PND, swelling in lower extremities, anasarca,  dizziness, palpitations Resp: +  shortness of breath with exertion or at rest.              + productive cough,  No non-productive cough,  No- coughing up of blood.              No-   change in color of mucus.  No- wheezing.   Skin: No-   rash or lesions. GI:  No-   heartburn, indigestion, abdominal pain, +nausea, No-vomiting,  GU: MS:  No-   joint pain or swelling. + Back pain   Neuro-     nothing unusual Psych:  No- change in mood or affect. No depression or anxiety.  No memory loss.  Objective:   Physical Exam General- Alert, Oriented, Affect-appropriate, Distress- none acute  Slender, talkative, Skin- rash-none, lesions- none, excoriation- none Lymphadenopathy- none Head- atraumatic            Eyes- Gross vision intact, PERRLA, conjunctivae clear secretions            Ears- Hearing, canals normal            Nose- Clear, No-Septal dev, mucus, polyps, erosion, perforation             Throat- Mallampati II , mucosa clear/ dry , drainage- none, tonsils- atrophy +chronic hoarseness Neck- flexible , trachea  midline, no stridor , thyroid nl, carotid no bruit Chest - symmetrical excursion , unlabored           Heart/CV- RRR now , no murmur , no gallop  , no rub, nl s1 s2                           - JVD- none , edema- none, stasis changes- none, varices- none           Lung- + few basilar crackles- not changed, wheeze- none, cough+light , dullness-none, rub- none           Chest wall-  Abd-  Br/ Gen/ Rectal- Not done, not indicated Extrem- cyanosis- none, clubbing, none, atrophy- none, strength- nl Neuro- grossly intact to observation

## 2012-09-20 DIAGNOSIS — F411 Generalized anxiety disorder: Secondary | ICD-10-CM | POA: Diagnosis not present

## 2012-09-20 DIAGNOSIS — M810 Age-related osteoporosis without current pathological fracture: Secondary | ICD-10-CM | POA: Diagnosis not present

## 2012-09-20 DIAGNOSIS — J479 Bronchiectasis, uncomplicated: Secondary | ICD-10-CM | POA: Diagnosis not present

## 2012-09-20 DIAGNOSIS — E039 Hypothyroidism, unspecified: Secondary | ICD-10-CM | POA: Diagnosis not present

## 2012-09-20 DIAGNOSIS — Z1331 Encounter for screening for depression: Secondary | ICD-10-CM | POA: Diagnosis not present

## 2012-09-20 DIAGNOSIS — I10 Essential (primary) hypertension: Secondary | ICD-10-CM | POA: Diagnosis not present

## 2012-09-21 NOTE — Assessment & Plan Note (Signed)
No recurrence of MAIC. Incidental respiratory infections during the winter we continue to watch for possible reflux and aspiration. Her vocal cord paralysis puts her at special risk.

## 2012-09-21 NOTE — Assessment & Plan Note (Signed)
She maintains a chronic bronchitis/bronchiectasis pattern without exacerbation and without known recurrence of her MAIC.

## 2012-09-25 NOTE — Progress Notes (Signed)
Quick Note:  Pt aware of results. ______ 

## 2012-10-03 DIAGNOSIS — H2510 Age-related nuclear cataract, unspecified eye: Secondary | ICD-10-CM | POA: Diagnosis not present

## 2012-12-18 ENCOUNTER — Telehealth: Payer: Self-pay | Admitting: Internal Medicine

## 2012-12-18 NOTE — Telephone Encounter (Signed)
Left message on voicemail that CY is out of the office this afternoon but I'll send this message to him to advise; that it may not be until morning that I have approval/denial for Rx. CY Please advise. Thanks.

## 2012-12-19 MED ORDER — HYDROCOD POLST-CHLORPHEN POLST 10-8 MG/5ML PO LQCR: 5.0000 mL | Freq: Two times a day (BID) | ORAL | Status: DC | PRN

## 2012-12-19 NOTE — Telephone Encounter (Signed)
Per CY-okay to give refill for Hydrocodone cough syrup.

## 2012-12-19 NOTE — Telephone Encounter (Signed)
I spoke with [name]. She is requesting refill on tussionex. Last time 115 ML was sent in. [name] stated this was a small amount and requesting larger amount  [name] spoke with CDY and he stated we can call in #[phone] ML x 0 refills. [name] isa ware. RX called in.

## 2013-01-01 DIAGNOSIS — Z85828 Personal history of other malignant neoplasm of skin: Secondary | ICD-10-CM | POA: Diagnosis not present

## 2013-01-01 DIAGNOSIS — L608 Other nail disorders: Secondary | ICD-10-CM | POA: Diagnosis not present

## 2013-01-01 DIAGNOSIS — D235 Other benign neoplasm of skin of trunk: Secondary | ICD-10-CM | POA: Diagnosis not present

## 2013-02-03 IMAGING — CR DG CHEST 2V
2 series · 2 of 2 positions shown · non-contrast
Comparison: Plain films of the chest 10/04/2009 and 07/11/2008.  CT
chest 01/01/2007.

CLINICAL DATA: Bronchiectasis.  COPD.

CHEST - 2 VIEW

[view not recorded (1 of 2)]
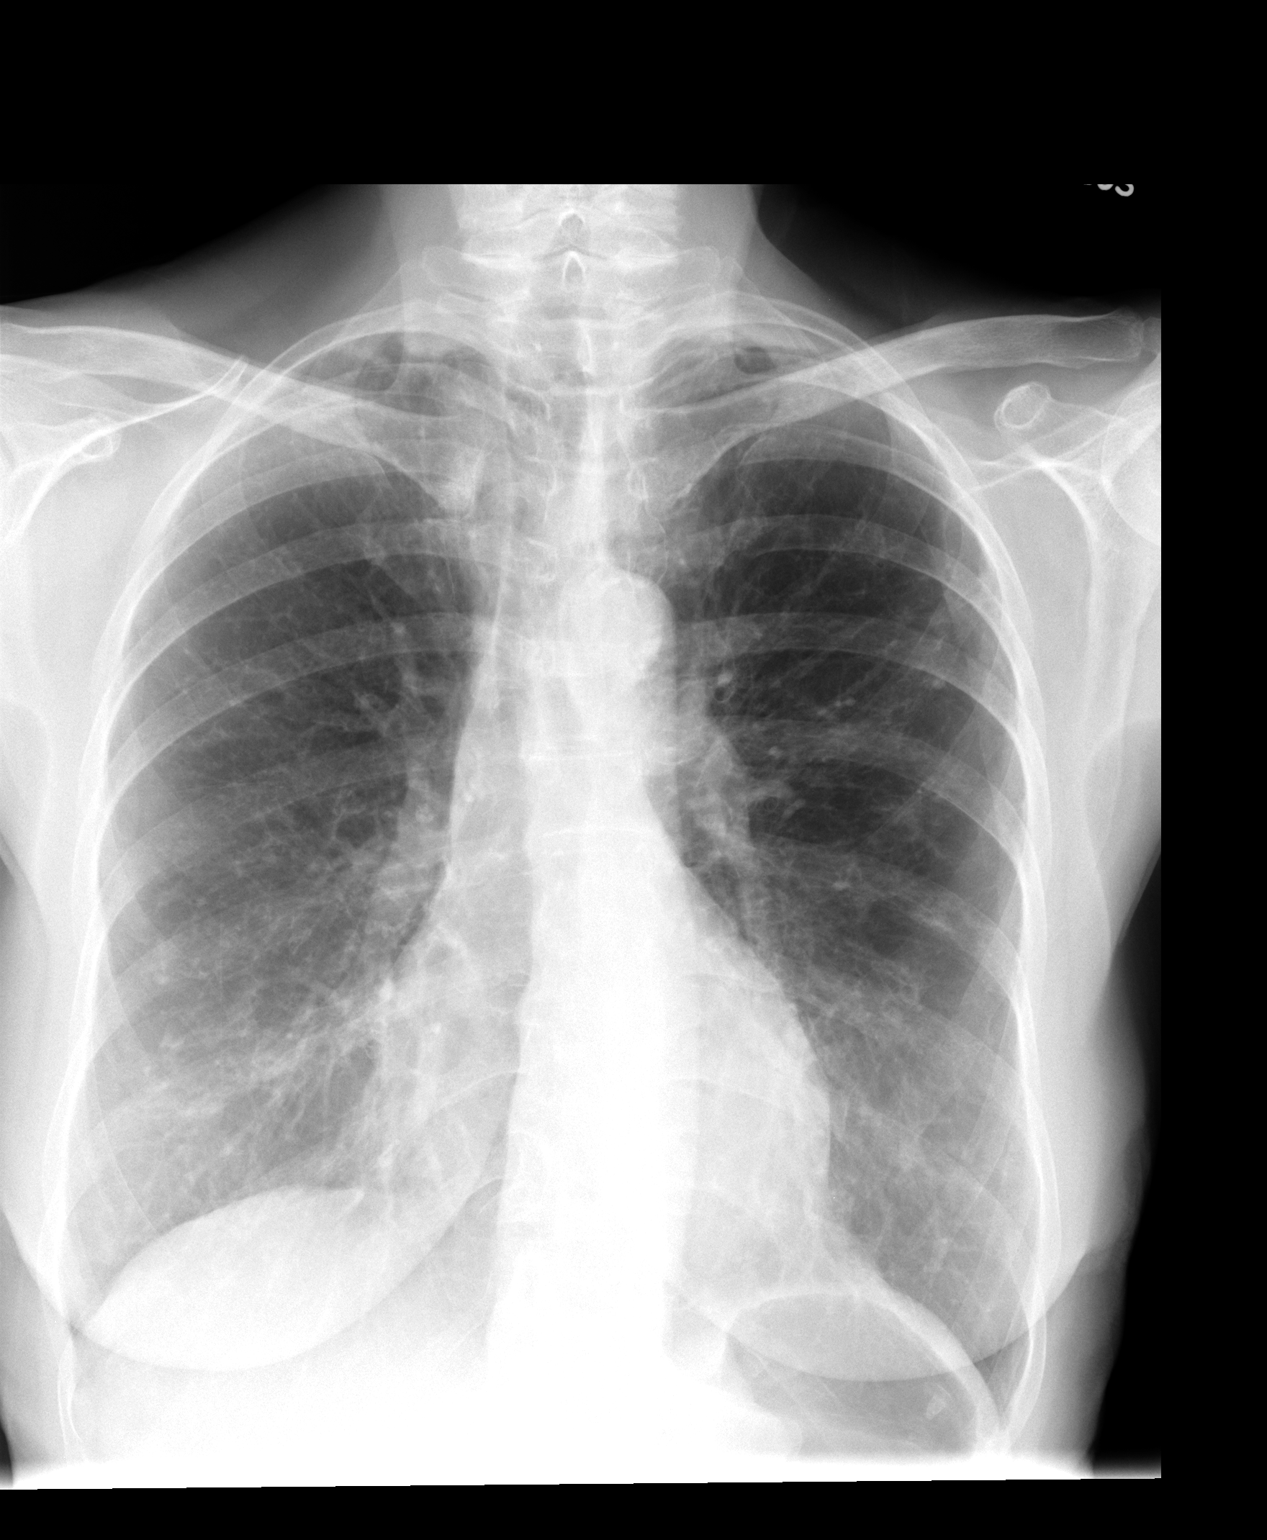

[view not recorded (2 of 2)]
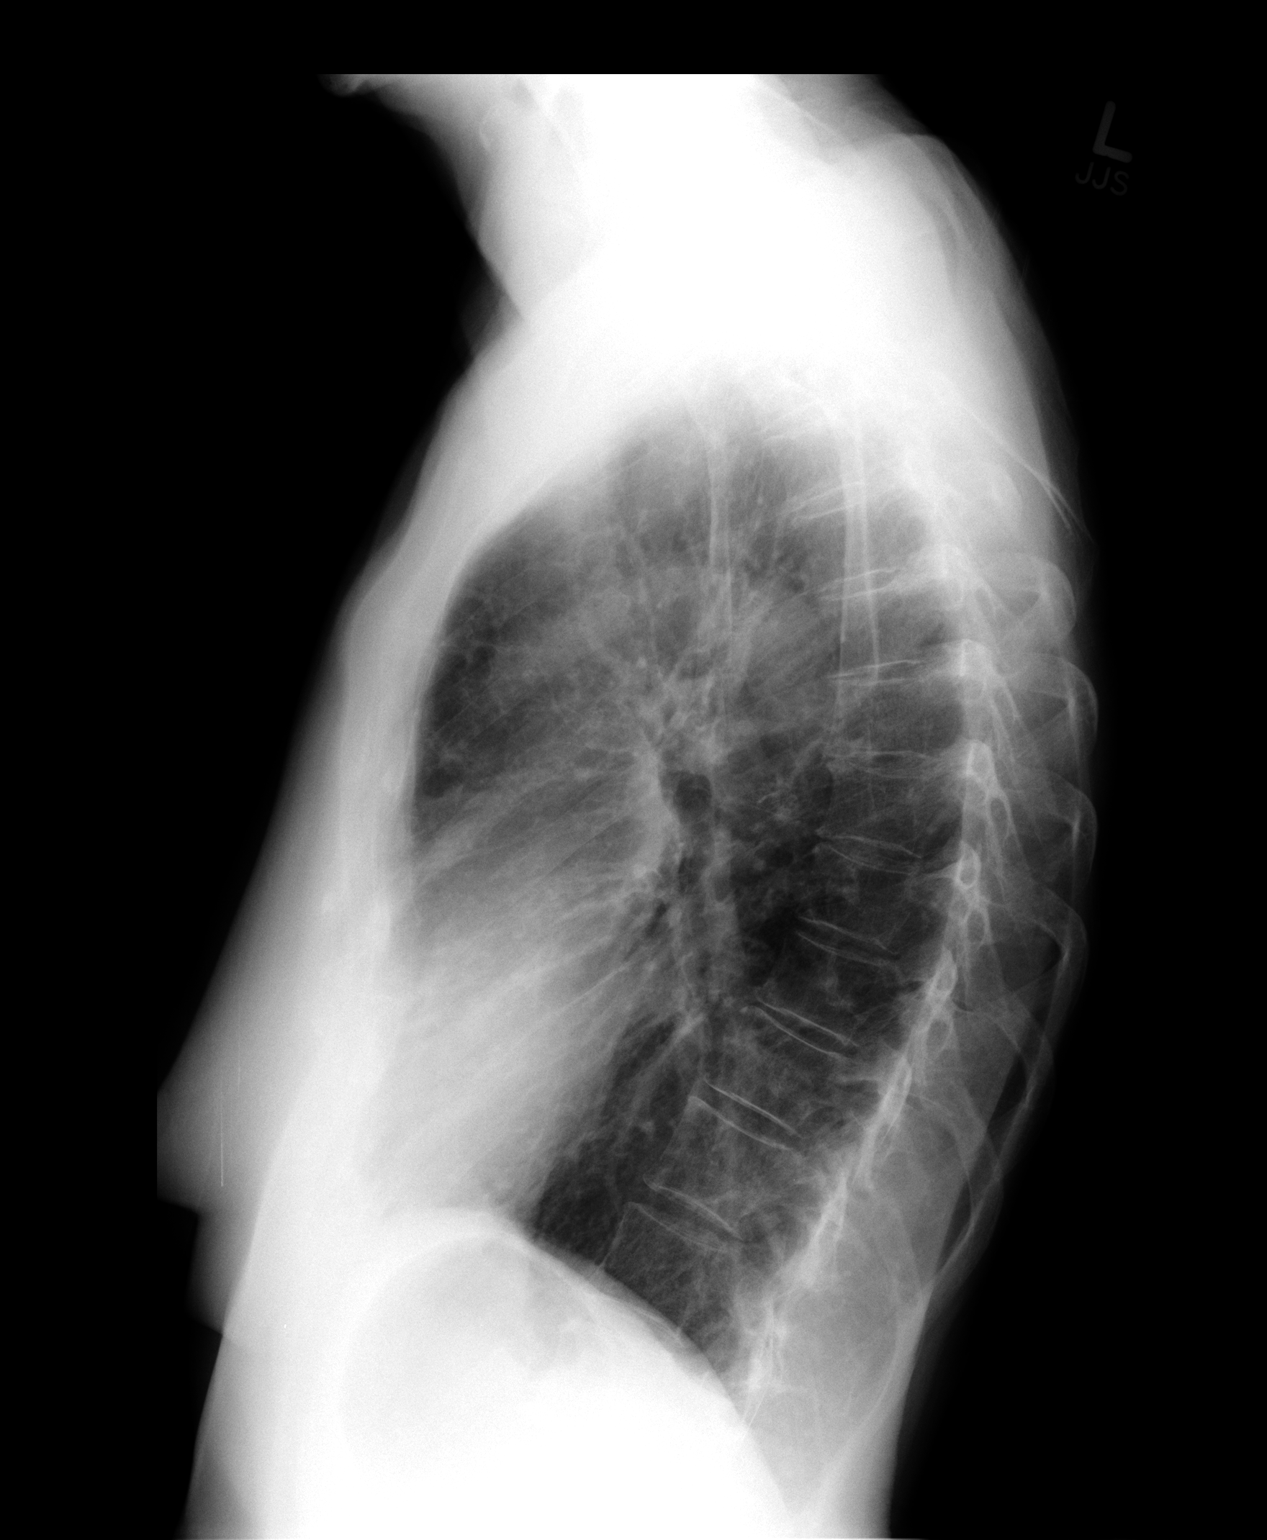

[2 of 2 positions shown; findings below may reference images not displayed]

FINDINGS: Left worse than right bronchiectatic change is again seen
and stable in appearance.  No new airspace disease or effusion.
Heart size is normal.
IMPRESSION: No acute finding.  Bronchiectasis appears similar compared to prior
studies.

## 2013-03-21 ENCOUNTER — Encounter: Payer: Self-pay | Admitting: Internal Medicine

## 2013-03-21 ENCOUNTER — Ambulatory Visit (INDEPENDENT_AMBULATORY_CARE_PROVIDER_SITE_OTHER): Payer: Medicare Other | Admitting: Internal Medicine

## 2013-03-21 VITALS — BP 114/60 | HR 84 | Ht 63.0 in | Wt 103.0 lb

## 2013-03-21 DIAGNOSIS — K219 Gastro-esophageal reflux disease without esophagitis: Secondary | ICD-10-CM | POA: Diagnosis not present

## 2013-03-21 DIAGNOSIS — J479 Bronchiectasis, uncomplicated: Secondary | ICD-10-CM | POA: Diagnosis not present

## 2013-03-21 DIAGNOSIS — J158 Pneumonia due to other specified bacteria: Secondary | ICD-10-CM

## 2013-03-21 DIAGNOSIS — Z23 Encounter for immunization: Secondary | ICD-10-CM

## 2013-03-21 MED ORDER — IPRATROPIUM BROMIDE HFA 17 MCG/ACT IN AERS: 2.0000 | INHALATION_SPRAY | Freq: Four times a day (QID) | RESPIRATORY_TRACT | Status: DC | PRN

## 2013-03-21 MED ORDER — OMEPRAZOLE 20 MG PO CPDR: 20.0000 mg | DELAYED_RELEASE_CAPSULE | Freq: Every day | ORAL | Status: DC

## 2013-03-21 MED ORDER — AZELASTINE-FLUTICASONE 137-50 MCG/ACT NA SUSP: 1.0000 | Freq: Every day | NASAL | Status: DC

## 2013-03-21 NOTE — Progress Notes (Signed)
Patient ID: [name], female    DOB: [birth year], 79 y.o.   MRN: [id number]  HPI 10/03/10- 62 yoF never smoker followed here for bronchiectasis, complicated by chronic vocal cord paresis, with hx MAIC- treated. Last here April 04, 2010. Had 2 episodes of laryngitis. Has had at least 2 x pneumovax. She now reports feeling well and doing well. Voice is always weak and hoarse. Denies choke or strangle with meals. Mild pill dysphagia. Denies waking from sleep choking. Does c/o "drainage in throat" when she lies down. Seasonal sneezing. Denies shortness of breath, chest pain or palpitation. Daily productive cough- usually dark, occasionally with streak heme. Has tusionex, used sparingly. Denies fever or night sweats.   02/01/11- 89 yoF never smoker followed here for bronchiectasis, complicated by chronic vocal cord paresis, with hx MAIC- treated.  She indicates she is doing ok. Shows me her current meds - Qvar 40, Atrovent HFA, Spiriva.  Saw Dr Janace Hoard ENT- he discussed putting stent in vocal cord, but she says she has had this voice 30 years (1978- had vocal cord nodules- surgery damaged nerve) and is wary of possibly losing voice if surgery went wrong. Discussed long term considerations of cough and airway protection, availability of second opinion. She is going back for laryngoscopy. Breathing ok except hot weather drives her indoors. Coughing less.  CXR 10/03/10- Stable bronchiectasis back to 2008, NAD.  Sputum cx for AFB negative.   08/09/11- 62 yoF never smoker followed here for bronchiectasis, complicated by chronic vocal cord paresis, with hx MAIC- treated. Daughter here today [name] gives me a somewhat rambling account of problems she's had to this fall and winter. Back pain was treated with steroid injection and some benefit but while she was hurting it took her breath. Narcotic pain medicines made her nauseated in December and January and she lost weight then. She saw Dr. Verita Schneiders for  followup to discuss what he might offer for surgical treatment of her chronic cord paresis. She decided to defer any intervention. In mid January she had an acute respiratory illness with sore throat for 3 days but no fever. Her primary physician treated with prednisone which made her very agitated, and a Z-Pak. In the last day or 2 her blood pressure has been labile if recorded correctly, down to 50 systolic and up to 332. She doesn't feel irregular palpitation and has not had syncope but she dropped off her blood pressure medicine for the last 2 days.  11/23/11- 26 yoF never smoker followed here for bronchiectasis, complicated by chronic vocal cord paresis, with hx MAIC- treated. Can tell more SOB and wheezing than usual; hoarseness as well Dr Dorthy Cooler gave Z-Pak and steroids in March for exacerbation of bronchitis.  Occasional night sweats and blood-streaked sputum, not progressive. She asks suggestions for management of muscle spasm. Taking Xanax. CXR 08/22/11 IMPRESSION:  No acute finding. Stable compared to prior examinations.  Original Report Authenticated By: Arvid Right. D'ALESSIO, M.D.    02/29/12- 62 yoF never smoker followed here for bronchiectasis, complicated by chronic vocal cord paresis, with hx MAIC- treated. SOB getting worse since last Thursday-started getting sick-contacted PCP office and gave Doxycycline and prednsione(completed) 1 week ago had sore throat with no fever. Primary physician started her on prednisone 40 mg daily x7 days, with doxycycline. She has a chronic history of persistent nausea and gas, starting before the doxycycline. She is about back to her respiratory baseline exertional dyspnea, little dry cough.  09/18/12- 82 yoF never  smoker followed here for bronchiectasis, complicated by chronic vocal cord paresis, with hx MAIC- treated. FOLLOWS FOR: gets more SOB than usual. Breathing has not gotten any better since last visit. She tells me reading is not really  different. Occasional coughing spells with no long-term change. Productive of white mucus with no blood. Several episodes of laryngitis this winter. Uses Spiriva and occasionally Atrovent. We discussed that overlap but she does not tolerate stimulants. We discussed her steroid Qvar. She is not walking much outside because of the weather and realizes she loses strength when she can't exercise. She has been told she has a hiatal hernia. We have talked about reflux and aspiration many times. She practices precautions as instructed.  03/21/13-82 yoF never smoker followed here for bronchiectasis, complicated by chronic vocal cord paresis, with hx MAIC- treated. FOLLOWS FOR:  Breathing is unchanged since last OV.  Would like to discuss medications Aware of reflux and we discussed the significance given her vocal cord paralysis. She continues to use Spiriva and Qvar 40. Treats chronic insomnia with Xanax 0.5 mg and amitriptyline 50 mg CXR 09/18/12 IMPRESSION:  No acute abnormality. Stable compared to prior exam.  Original Report Authenticated By: Holley Dexter, M.D.  ROS-see HPI Constitutional:   No-   weight loss, +night sweats, fevers, chills, fatigue, lassitude. HEENT:   No-  headaches, difficulty swallowing, tooth/dental problems, sore throat, + chronic hoarseness      No-  sneezing, itching, ear ache, nasal congestion, post nasal drip,  CV:  No-   chest pain, orthopnea, PND, swelling in lower extremities, anasarca,  dizziness, palpitations Resp: +  shortness of breath with exertion or at rest.              + productive cough,  No non-productive cough,  No- coughing up of blood.              No-   change in color of mucus.  No- wheezing.   Skin: No-   rash or lesions. GI:  + heartburn, indigestion, No-abdominal pain, nausea, No-vomiting,  GU: MS:  No-   joint pain or swelling. + Back pain   Neuro-     nothing unusual Psych:  No- change in mood or affect. No depression or anxiety.  No memory  loss.  Objective:   Physical Exam General- Alert, Oriented, Affect-appropriate, Distress- none acute  Slender, talkative, Skin- rash-none, lesions- none, excoriation- none Lymphadenopathy- none Head- atraumatic            Eyes- Gross vision intact, PERRLA, conjunctivae clear secretions            Ears- Hearing, canals normal            Nose- Clear, No-Septal dev, mucus, polyps, erosion, perforation             Throat- Mallampati II , mucosa clear/ dry , drainage- none, tonsils- atrophy, +chronic hoarseness Neck- flexible , trachea midline, no stridor , thyroid nl, carotid no bruit Chest - symmetrical excursion , unlabored           Heart/CV- RRR now , no murmur , no gallop  , no rub, nl s1 s2                           - JVD- none , edema- none, stasis changes- none, varices- none           Lung- clear today, wheeze- none, cough+light , dullness-none, rub- none  Chest wall-  Abd-  Br/ Gen/ Rectal- Not done, not indicated Extrem- cyanosis- none, clubbing, none, atrophy- none, strength- nl Neuro- grossly intact to observation

## 2013-03-21 NOTE — Patient Instructions (Addendum)
Sample Dymista nasal spray  1-2 puffs each nostril once daily at bedtime, for drainage  Flu vax  Script sent refilling Atrovent  Script for omeprazole acid blocker for acid indigestion  Ok to use Mucinex as needed

## 2013-03-30 DIAGNOSIS — K219 Gastro-esophageal reflux disease without esophagitis: Secondary | ICD-10-CM | POA: Insufficient documentation

## 2013-03-30 NOTE — Assessment & Plan Note (Signed)
We discussed risk of aspiration given her vocal cord paresis Plan-omeprazole and reflux precautions

## 2013-03-30 NOTE — Assessment & Plan Note (Signed)
Still no evidence of recurrence

## 2013-03-30 NOTE — Assessment & Plan Note (Signed)
Without acute exacerbation. Reflux precautions emphasized Plan-refill Atrovent HFA, flu vaccine

## 2013-04-02 ENCOUNTER — Telehealth: Payer: Self-pay | Admitting: Internal Medicine

## 2013-04-02 MED ORDER — AEROCHAMBER MV MISC: Status: AC

## 2013-04-02 NOTE — Telephone Encounter (Signed)
Called, spoke with pt.  Pt reports she has an aerochamber to use with her qvar.  States this broke yesterday.  She is requesting to get a new one.  This isn't on pt's med list.  So, I spoke with Katie as Dr. Maple Hudson is off.  Per [name], it is ok to give pt an aerochamber.  I have placed this at the front with the rx and [facility] form.  I have also included a note to get triage when pt picks up so we can ensure form gets completed and then is given to Katie to have CDY sign rx when he returns to office.

## 2013-04-03 DIAGNOSIS — H2589 Other age-related cataract: Secondary | ICD-10-CM | POA: Diagnosis not present

## 2013-04-07 DIAGNOSIS — I10 Essential (primary) hypertension: Secondary | ICD-10-CM | POA: Diagnosis not present

## 2013-04-07 DIAGNOSIS — J479 Bronchiectasis, uncomplicated: Secondary | ICD-10-CM | POA: Diagnosis not present

## 2013-04-07 DIAGNOSIS — F411 Generalized anxiety disorder: Secondary | ICD-10-CM | POA: Diagnosis not present

## 2013-04-07 DIAGNOSIS — E039 Hypothyroidism, unspecified: Secondary | ICD-10-CM | POA: Diagnosis not present

## 2013-04-07 DIAGNOSIS — M810 Age-related osteoporosis without current pathological fracture: Secondary | ICD-10-CM | POA: Diagnosis not present

## 2013-06-12 DIAGNOSIS — M810 Age-related osteoporosis without current pathological fracture: Secondary | ICD-10-CM | POA: Diagnosis not present

## 2013-06-24 DIAGNOSIS — F411 Generalized anxiety disorder: Secondary | ICD-10-CM | POA: Diagnosis not present

## 2013-06-24 DIAGNOSIS — M810 Age-related osteoporosis without current pathological fracture: Secondary | ICD-10-CM | POA: Diagnosis not present

## 2013-07-15 DIAGNOSIS — M545 Low back pain, unspecified: Secondary | ICD-10-CM | POA: Diagnosis not present

## 2013-07-15 DIAGNOSIS — M47817 Spondylosis without myelopathy or radiculopathy, lumbosacral region: Secondary | ICD-10-CM | POA: Diagnosis not present

## 2013-07-16 DIAGNOSIS — R300 Dysuria: Secondary | ICD-10-CM | POA: Diagnosis not present

## 2013-07-16 DIAGNOSIS — N390 Urinary tract infection, site not specified: Secondary | ICD-10-CM | POA: Diagnosis not present

## 2013-07-28 ENCOUNTER — Other Ambulatory Visit: Payer: Self-pay | Admitting: Internal Medicine

## 2013-09-18 ENCOUNTER — Ambulatory Visit: Payer: Medicare Other | Admitting: Internal Medicine

## 2013-10-02 DIAGNOSIS — H52229 Regular astigmatism, unspecified eye: Secondary | ICD-10-CM | POA: Diagnosis not present

## 2013-10-02 DIAGNOSIS — H2589 Other age-related cataract: Secondary | ICD-10-CM | POA: Diagnosis not present

## 2013-10-02 DIAGNOSIS — H2510 Age-related nuclear cataract, unspecified eye: Secondary | ICD-10-CM | POA: Diagnosis not present

## 2013-10-02 DIAGNOSIS — H5231 Anisometropia: Secondary | ICD-10-CM | POA: Diagnosis not present

## 2013-10-17 ENCOUNTER — Ambulatory Visit (INDEPENDENT_AMBULATORY_CARE_PROVIDER_SITE_OTHER)
Admission: RE | Admit: 2013-10-17 | Discharge: 2013-10-17 | Disposition: A | Discharge status: Home / Self Care | Payer: Medicare Other | Source: Ambulatory Visit | Attending: Internal Medicine | Admitting: Internal Medicine

## 2013-10-17 ENCOUNTER — Encounter: Payer: Self-pay | Admitting: Internal Medicine

## 2013-10-17 ENCOUNTER — Ambulatory Visit (INDEPENDENT_AMBULATORY_CARE_PROVIDER_SITE_OTHER): Payer: Medicare Other | Admitting: Internal Medicine

## 2013-10-17 VITALS — BP 124/70 | HR 75 | Ht 63.0 in | Wt 107.2 lb

## 2013-10-17 DIAGNOSIS — J3800 Paralysis of vocal cords and larynx, unspecified: Secondary | ICD-10-CM | POA: Diagnosis not present

## 2013-10-17 DIAGNOSIS — J479 Bronchiectasis, uncomplicated: Secondary | ICD-10-CM | POA: Diagnosis not present

## 2013-10-17 DIAGNOSIS — J158 Pneumonia due to other specified bacteria: Secondary | ICD-10-CM

## 2013-10-17 MED ORDER — BECLOMETHASONE DIPROPIONATE 40 MCG/ACT IN AERS: 2.0000 | INHALATION_SPRAY | Freq: Two times a day (BID) | RESPIRATORY_TRACT | Status: DC

## 2013-10-17 NOTE — Patient Instructions (Signed)
Refill Script for Qvar sent to [facility]  Order CXR  Dx bronchiectasis

## 2013-10-17 NOTE — Progress Notes (Signed)
Patient ID: [name], female    DOB: [birth year], 80 y.o.   MRN: [id number]  HPI 10/03/10- 62 yoF never smoker followed here for bronchiectasis, complicated by chronic vocal cord paresis, with hx MAIC- treated. Last here April 04, 2010. Had 2 episodes of laryngitis. Has had at least 2 x pneumovax. She now reports feeling well and doing well. Voice is always weak and hoarse. Denies choke or strangle with meals. Mild pill dysphagia. Denies waking from sleep choking. Does c/o "drainage in throat" when she lies down. Seasonal sneezing. Denies shortness of breath, chest pain or palpitation. Daily productive cough- usually dark, occasionally with streak heme. Has tusionex, used sparingly. Denies fever or night sweats.   02/01/11- 89 yoF never smoker followed here for bronchiectasis, complicated by chronic vocal cord paresis, with hx MAIC- treated.  She indicates she is doing ok. Shows me her current meds - Qvar 40, Atrovent HFA, Spiriva.  Saw Dr Janace Hoard ENT- he discussed putting stent in vocal cord, but she says she has had this voice 30 years (1978- had vocal cord nodules- surgery damaged nerve) and is wary of possibly losing voice if surgery went wrong. Discussed long term considerations of cough and airway protection, availability of second opinion. She is going back for laryngoscopy. Breathing ok except hot weather drives her indoors. Coughing less.  CXR 10/03/10- Stable bronchiectasis back to 2008, NAD.  Sputum cx for AFB negative.   08/09/11- 62 yoF never smoker followed here for bronchiectasis, complicated by chronic vocal cord paresis, with hx MAIC- treated. Daughter here today [name] gives me a somewhat rambling account of problems she's had to this fall and winter. Back pain was treated with steroid injection and some benefit but while she was hurting it took her breath. Narcotic pain medicines made her nauseated in December and January and she lost weight then. She saw Dr. Verita Schneiders for  followup to discuss what he might offer for surgical treatment of her chronic cord paresis. She decided to defer any intervention. In mid January she had an acute respiratory illness with sore throat for 3 days but no fever. Her primary physician treated with prednisone which made her very agitated, and a Z-Pak. In the last day or 2 her blood pressure has been labile if recorded correctly, down to 50 systolic and up to 332. She doesn't feel irregular palpitation and has not had syncope but she dropped off her blood pressure medicine for the last 2 days.  11/23/11- 26 yoF never smoker followed here for bronchiectasis, complicated by chronic vocal cord paresis, with hx MAIC- treated. Can tell more SOB and wheezing than usual; hoarseness as well Dr Dorthy Cooler gave Z-Pak and steroids in March for exacerbation of bronchitis.  Occasional night sweats and blood-streaked sputum, not progressive. She asks suggestions for management of muscle spasm. Taking Xanax. CXR 08/22/11 IMPRESSION:  No acute finding. Stable compared to prior examinations.  Original Report Authenticated By: Arvid Right. D'ALESSIO, M.D.    02/29/12- 62 yoF never smoker followed here for bronchiectasis, complicated by chronic vocal cord paresis, with hx MAIC- treated. SOB getting worse since last Thursday-started getting sick-contacted PCP office and gave Doxycycline and prednsione(completed) 1 week ago had sore throat with no fever. Primary physician started her on prednisone 40 mg daily x7 days, with doxycycline. She has a chronic history of persistent nausea and gas, starting before the doxycycline. She is about back to her respiratory baseline exertional dyspnea, little dry cough.  09/18/12- 82 yoF never  smoker followed here for bronchiectasis, complicated by chronic vocal cord paresis, with hx MAIC- treated. FOLLOWS FOR: gets more SOB than usual. Breathing has not gotten any better since last visit. She tells me reading is not really  different. Occasional coughing spells with no long-term change. Productive of white mucus with no blood. Several episodes of laryngitis this winter. Uses Spiriva and occasionally Atrovent. We discussed that overlap but she does not tolerate stimulants. We discussed her steroid Qvar. She is not walking much outside because of the weather and realizes she loses strength when she can't exercise. She has been told she has a hiatal hernia. We have talked about reflux and aspiration many times. She practices precautions as instructed.  03/21/13-82 yoF never smoker followed here for bronchiectasis, complicated by chronic vocal cord paresis, with hx MAIC- treated. FOLLOWS FOR:  Breathing is unchanged since last OV.  Would like to discuss medications Aware of reflux and we discussed the significance given her vocal cord paralysis. She continues to use Spiriva and Qvar 40. Treats chronic insomnia with Xanax 0.5 mg and amitriptyline 50 mg CXR 09/18/12 IMPRESSION:  No acute abnormality. Stable compared to prior exam.  Original Report Authenticated By: Orlean Patten, M.D.  10/17/13- 83 yoF never smoker followed here for bronchiectasis, complicated by chronic vocal cord paresis, with hx MAIC- treated. follows for:  breathing has been about the same since her last ov.  pt has been walking and wanted Dr. Annamaria Boots to be aware.  pt has cut way back on her pain meds.  Insomnia-sleeps well with amitriptyline +1/2 Xanax Daily productive cough with clear to dark sputum and rare blood streak. Continues Spiriva, Qvar and occasional Atrovent rescue inhaler  ROS-see HPI Constitutional:   No-   weight loss, +night sweats, fevers, chills, fatigue, lassitude. HEENT:   No-  headaches, difficulty swallowing, tooth/dental problems, sore throat, + chronic hoarseness      No-  sneezing, itching, ear ache, nasal congestion, post nasal drip,  CV:  No-   chest pain, orthopnea, PND, swelling in lower extremities, anasarca,  dizziness,  palpitations Resp: +  shortness of breath with exertion or at rest.              + productive cough,  No non-productive cough,  + coughing up of blood.              No-   change in color of mucus.  No- wheezing.   Skin: No-   rash or lesions. GI:  + heartburn, indigestion, No-abdominal pain, nausea, No-vomiting,  GU: MS:  No-   joint pain or swelling. + Back pain   Neuro-     nothing unusual Psych:  No- change in mood or affect. No depression or anxiety.  No memory loss.  Objective:   Physical Exam General- Alert, Oriented, Affect-appropriate, Distress- none acute  Slender, talkative, Skin- rash-none, lesions- none, excoriation- none Lymphadenopathy- none Head- atraumatic            Eyes- Gross vision intact, PERRLA, conjunctivae clear secretions            Ears- Hearing, canals normal            Nose- Clear, No-Septal dev, mucus, polyps, erosion, perforation             Throat- Mallampati II , mucosa clear/ dry , drainage- none, tonsils- atrophy, +chronic  hoarseness Neck- flexible , trachea midline, no stridor , thyroid nl, carotid no bruit Chest - symmetrical excursion , unlabored           Heart/CV- RRR now , no murmur , no gallop  , no rub, nl s1 s2                           - JVD- none , edema- none, stasis changes- none, varices- none           Lung- +wet squeaks right base, wheeze- none, cough+light , dullness-none, rub- none           Chest wall-  Abd-  Br/ Gen/ Rectal- Not done, not indicated Extrem- cyanosis- none, clubbing, none, atrophy- none, strength- nl Neuro- grossly intact to observation

## 2013-10-20 ENCOUNTER — Telehealth: Payer: Self-pay | Admitting: Internal Medicine

## 2013-10-20 NOTE — Telephone Encounter (Signed)
Notes Recorded by Deneise Lever, MD on 10/17/2013 at 4:30 PM CXR- stable chronic scarring. Nothing new or progressive.   I spoke with patient about results and she verbalized understanding and had no questions

## 2013-10-24 DIAGNOSIS — M810 Age-related osteoporosis without current pathological fracture: Secondary | ICD-10-CM | POA: Diagnosis not present

## 2013-10-24 DIAGNOSIS — F411 Generalized anxiety disorder: Secondary | ICD-10-CM | POA: Diagnosis not present

## 2013-10-24 DIAGNOSIS — R109 Unspecified abdominal pain: Secondary | ICD-10-CM | POA: Diagnosis not present

## 2013-10-24 DIAGNOSIS — M26629 Arthralgia of temporomandibular joint, unspecified side: Secondary | ICD-10-CM | POA: Diagnosis not present

## 2013-11-16 NOTE — Assessment & Plan Note (Signed)
Chronic productive cough is stable to improved. Not surprised at the very occasional slight blood streak. Plan-chest x-ray, refill Qvar, watch need to reculture

## 2013-11-16 NOTE — Assessment & Plan Note (Signed)
Not changed 

## 2013-11-16 NOTE — Assessment & Plan Note (Signed)
Watch need to reculture

## 2014-01-07 ENCOUNTER — Telehealth: Payer: Self-pay | Admitting: Internal Medicine

## 2014-01-07 MED ORDER — DOXYCYCLINE HYCLATE 100 MG PO TABS: ORAL_TABLET | ORAL | Status: DC

## 2014-01-07 MED ORDER — HYDROCOD POLST-CHLORPHEN POLST 10-8 MG/5ML PO LQCR: 5.0000 mL | Freq: Two times a day (BID) | ORAL | Status: DC | PRN

## 2014-01-07 NOTE — Telephone Encounter (Signed)
PT c/o worsening SOB, pnd, prod cough (Dark green),  Cough waking pt up at night, sob about the same.  Pt has been taking an old rx for Tussionex .  She is requesting a refill on this and anything else Dr Annamaria Boots would recommend.

## 2014-01-07 NOTE — Telephone Encounter (Signed)
Spoke with pt and advised of Dr Janee Morn recommendations.  Rx sent for Doxycycline.  Rx for Tussionex printed and left for DR Young to sign and will be left at front desk for pick up.

## 2014-01-07 NOTE — Telephone Encounter (Signed)
Ok to refill tusionex as before  Offer doxycycline 100 mg, # 8, 2 today then one daily

## 2014-01-07 NOTE — Telephone Encounter (Signed)
Rx signed and placed at front for pick up.

## 2014-01-08 ENCOUNTER — Telehealth: Payer: Self-pay | Admitting: Internal Medicine

## 2014-01-08 MED ORDER — AMOXICILLIN-POT CLAVULANATE 500-125 MG PO TABS: 1.0000 | ORAL_TABLET | Freq: Two times a day (BID) | ORAL | Status: DC

## 2014-01-08 NOTE — Telephone Encounter (Signed)
Pt states that the Doxycycline causes her BP to be elevated--cannot take this. Pt requesting something else be called into [facility] This has been added to the patients allergy list as a reaction of this medication  Allergies  Allergen Reactions  . Asa [Aspirin]     Spits up blood  . Boniva [Ibandronic Acid]     GI upset  . Doxycycline Nausea Only and Hypertension   Please advise Dr Annamaria Boots. Thanks.

## 2014-01-08 NOTE — Telephone Encounter (Signed)
Pt aware that Augmentin 500mg  Take 1 BID #14 -- called into [facility] Nothing further needed.

## 2014-01-08 NOTE — Telephone Encounter (Signed)
Offer augmentin 500 mg, # 14, 1 twice daily, refill x 1

## 2014-01-16 DIAGNOSIS — IMO0002 Reserved for concepts with insufficient information to code with codable children: Secondary | ICD-10-CM | POA: Diagnosis not present

## 2014-01-16 DIAGNOSIS — F411 Generalized anxiety disorder: Secondary | ICD-10-CM | POA: Diagnosis not present

## 2014-01-16 DIAGNOSIS — Z23 Encounter for immunization: Secondary | ICD-10-CM | POA: Diagnosis not present

## 2014-01-16 DIAGNOSIS — E039 Hypothyroidism, unspecified: Secondary | ICD-10-CM | POA: Diagnosis not present

## 2014-01-16 DIAGNOSIS — J069 Acute upper respiratory infection, unspecified: Secondary | ICD-10-CM | POA: Diagnosis not present

## 2014-01-16 DIAGNOSIS — I10 Essential (primary) hypertension: Secondary | ICD-10-CM | POA: Diagnosis not present

## 2014-01-16 DIAGNOSIS — Z79899 Other long term (current) drug therapy: Secondary | ICD-10-CM | POA: Diagnosis not present

## 2014-03-09 DIAGNOSIS — R490 Dysphonia: Secondary | ICD-10-CM | POA: Diagnosis not present

## 2014-03-09 DIAGNOSIS — R1310 Dysphagia, unspecified: Secondary | ICD-10-CM | POA: Diagnosis not present

## 2014-03-09 DIAGNOSIS — J3801 Paralysis of vocal cords and larynx, unilateral: Secondary | ICD-10-CM | POA: Diagnosis not present

## 2014-03-10 DIAGNOSIS — H21309 Idiopathic cysts of iris, ciliary body or anterior chamber, unspecified eye: Secondary | ICD-10-CM | POA: Diagnosis not present

## 2014-03-10 DIAGNOSIS — H2589 Other age-related cataract: Secondary | ICD-10-CM | POA: Diagnosis not present

## 2014-03-11 DIAGNOSIS — L820 Inflamed seborrheic keratosis: Secondary | ICD-10-CM | POA: Diagnosis not present

## 2014-03-11 DIAGNOSIS — D235 Other benign neoplasm of skin of trunk: Secondary | ICD-10-CM | POA: Diagnosis not present

## 2014-03-23 ENCOUNTER — Other Ambulatory Visit: Payer: Self-pay | Admitting: Internal Medicine

## 2014-04-14 DIAGNOSIS — M545 Low back pain: Secondary | ICD-10-CM | POA: Diagnosis not present

## 2014-04-14 DIAGNOSIS — I10 Essential (primary) hypertension: Secondary | ICD-10-CM | POA: Diagnosis not present

## 2014-04-14 DIAGNOSIS — M1990 Unspecified osteoarthritis, unspecified site: Secondary | ICD-10-CM | POA: Diagnosis not present

## 2014-04-14 DIAGNOSIS — E039 Hypothyroidism, unspecified: Secondary | ICD-10-CM | POA: Diagnosis not present

## 2014-04-14 DIAGNOSIS — F419 Anxiety disorder, unspecified: Secondary | ICD-10-CM | POA: Diagnosis not present

## 2014-04-14 DIAGNOSIS — Z23 Encounter for immunization: Secondary | ICD-10-CM | POA: Diagnosis not present

## 2014-04-20 ENCOUNTER — Ambulatory Visit (INDEPENDENT_AMBULATORY_CARE_PROVIDER_SITE_OTHER): Payer: Medicare Other | Admitting: Internal Medicine

## 2014-04-20 ENCOUNTER — Encounter: Payer: Self-pay | Admitting: Internal Medicine

## 2014-04-20 VITALS — BP 90/58 | HR 85 | Ht 63.0 in | Wt 99.8 lb

## 2014-04-20 DIAGNOSIS — J479 Bronchiectasis, uncomplicated: Secondary | ICD-10-CM | POA: Diagnosis not present

## 2014-04-20 DIAGNOSIS — J310 Chronic rhinitis: Secondary | ICD-10-CM | POA: Diagnosis not present

## 2014-04-20 DIAGNOSIS — J3800 Paralysis of vocal cords and larynx, unspecified: Secondary | ICD-10-CM

## 2014-04-20 DIAGNOSIS — E46 Unspecified protein-calorie malnutrition: Secondary | ICD-10-CM | POA: Diagnosis not present

## 2014-04-20 MED ORDER — AZELASTINE-FLUTICASONE 137-50 MCG/ACT NA SUSP: 1.0000 | Freq: Every day | NASAL | Status: DC

## 2014-04-20 MED ORDER — HYDROCOD POLST-CHLORPHEN POLST 10-8 MG/5ML PO LQCR: 5.0000 mL | Freq: Two times a day (BID) | ORAL | Status: DC | PRN

## 2014-04-20 NOTE — Assessment & Plan Note (Signed)
Rhinitis with postnasal drainage. Not clear if this is allergic or nonallergic Plan-sample and prescription Dymista nasal spray,  Lab for allergy profile

## 2014-04-20 NOTE — Patient Instructions (Addendum)
Scripts printed for [facility] and Tussionex  Please call as needed

## 2014-04-20 NOTE — Assessment & Plan Note (Addendum)
Chest x-ray has been stable. Clinical pattern has been stable. We expect occasional minor hemoptysis. She denies there has been any progression. We considered updating a CT scan of her chest, but with stable symptoms and recently stable chest x-ray, we felt this would not be helpful now.

## 2014-04-20 NOTE — Assessment & Plan Note (Signed)
A study is being organized for nutritional replacement and chronic lung disease and she may be a candidate. She feels unable to gain weight.

## 2014-04-20 NOTE — Assessment & Plan Note (Signed)
She has been evaluated again by Dr. Janace Hoard. She is not interested in surgery or stenting at this time. I mentioned second opinion opportunity with the vocal cord clinic at [facility], but I don't know that they can now offer anything more.

## 2014-04-20 NOTE — Progress Notes (Signed)
Patient ID: AVIELLE IMBERT, female    DOB: [birth year], 80 y.o.   MRN: [id number]  HPI 10/03/10- 62 yoF never smoker followed here for bronchiectasis, complicated by chronic vocal cord paresis, with hx MAIC- treated. Last here April 04, 2010. Had 2 episodes of laryngitis. Has had at least 2 x pneumovax. She now reports feeling well and doing well. Voice is always weak and hoarse. Denies choke or strangle with meals. Mild pill dysphagia. Denies waking from sleep choking. Does c/o "drainage in throat" when she lies down. Seasonal sneezing. Denies shortness of breath, chest pain or palpitation. Daily productive cough- usually dark, occasionally with streak heme. Has tusionex, used sparingly. Denies fever or night sweats.   02/01/11- 89 yoF never smoker followed here for bronchiectasis, complicated by chronic vocal cord paresis, with hx MAIC- treated.  She indicates she is doing ok. Shows me her current meds - Qvar 40, Atrovent HFA, Spiriva.  Saw Dr Janace Hoard ENT- he discussed putting stent in vocal cord, but she says she has had this voice 30 years (1978- had vocal cord nodules- surgery damaged nerve) and is wary of possibly losing voice if surgery went wrong. Discussed long term considerations of cough and airway protection, availability of second opinion. She is going back for laryngoscopy. Breathing ok except hot weather drives her indoors. Coughing less.  CXR 10/03/10- Stable bronchiectasis back to 2008, NAD.  Sputum cx for AFB negative.   08/09/11- 62 yoF never smoker followed here for bronchiectasis, complicated by chronic vocal cord paresis, with hx MAIC- treated. Daughter here today [name] gives me a somewhat rambling account of problems she's had to this fall and winter. Back pain was treated with steroid injection and some benefit but while she was hurting it took her breath. Narcotic pain medicines made her nauseated in December and January and she lost weight then. She saw Dr. Verita Schneiders for  followup to discuss what he might offer for surgical treatment of her chronic cord paresis. She decided to defer any intervention. In mid January she had an acute respiratory illness with sore throat for 3 days but no fever. Her primary physician treated with prednisone which made her very agitated, and a Z-Pak. In the last day or 2 her blood pressure has been labile if recorded correctly, down to 50 systolic and up to 332. She doesn't feel irregular palpitation and has not had syncope but she dropped off her blood pressure medicine for the last 2 days.  11/23/11- 26 yoF never smoker followed here for bronchiectasis, complicated by chronic vocal cord paresis, with hx MAIC- treated. Can tell more SOB and wheezing than usual; hoarseness as well Dr Dorthy Cooler gave Z-Pak and steroids in March for exacerbation of bronchitis.  Occasional night sweats and blood-streaked sputum, not progressive. She asks suggestions for management of muscle spasm. Taking Xanax. CXR 08/22/11 IMPRESSION:  No acute finding. Stable compared to prior examinations.  Original Report Authenticated By: Arvid Right. D'ALESSIO, M.D.    02/29/12- 62 yoF never smoker followed here for bronchiectasis, complicated by chronic vocal cord paresis, with hx MAIC- treated. SOB getting worse since last Thursday-started getting sick-contacted PCP office and gave Doxycycline and prednsione(completed) 1 week ago had sore throat with no fever. Primary physician started her on prednisone 40 mg daily x7 days, with doxycycline. She has a chronic history of persistent nausea and gas, starting before the doxycycline. She is about back to her respiratory baseline exertional dyspnea, little dry cough.  09/18/12- 82 yoF never  smoker followed here for bronchiectasis, complicated by chronic vocal cord paresis, with hx MAIC- treated. FOLLOWS FOR: gets more SOB than usual. Breathing has not gotten any better since last visit. She tells me reading is not really  different. Occasional coughing spells with no long-term change. Productive of white mucus with no blood. Several episodes of laryngitis this winter. Uses Spiriva and occasionally Atrovent. We discussed that overlap but she does not tolerate stimulants. We discussed her steroid Qvar. She is not walking much outside because of the weather and realizes she loses strength when she can't exercise. She has been told she has a hiatal hernia. We have talked about reflux and aspiration many times. She practices precautions as instructed.  03/21/13-82 yoF never smoker followed here for bronchiectasis, complicated by chronic vocal cord paresis, with hx MAIC- treated. FOLLOWS FOR:  Breathing is unchanged since last OV.  Would like to discuss medications Aware of reflux and we discussed the significance given her vocal cord paralysis. She continues to use Spiriva and Qvar 40. Treats chronic insomnia with Xanax 0.5 mg and amitriptyline 50 mg CXR 09/18/12 IMPRESSION:  No acute abnormality. Stable compared to prior exam.  Original Report Authenticated By: Orlean Patten, M.D.  10/17/13- 77 yoF never smoker followed here for bronchiectasis, complicated by chronic vocal cord paresis, with hx MAIC- treated. follows for:  breathing has been about the same since her last ov.  pt has been walking and wanted Dr. Annamaria Boots to be aware.  pt has cut way back on her pain meds.  Insomnia-sleeps well with amitriptyline +1/2 Xanax Daily productive cough with clear to dark sputum and rare blood streak. Continues Spiriva, Qvar and occasional Atrovent rescue inhaler  04/20/14-83 yoF never smoker followed here for bronchiectasis with hx MAIC- treated, intermittent hemoptysis,  complicated by chronic vocal cord paresis, Hashimoto's thyroiditis, GERD, Insomnia FOLLOW FOR:  Bronchiectasis; discuss surgery for paralyzed vocal chords, coughs up blood periodically     Daughter here               has had flu shot  Dymista nasal spray did help  bothersome postnasal drainage. She asks for allergy assessment. Taking Tussionex and Xanax to sleep through the night without cough. I expressed concern about oversedation at her age and discussed management. Cough productive white phlegm occasional tinge of blood. She feels her most bothersome complaint is her chronic hoarseness for which she has seen Dr. Janace Hoard ENT again. She is not interested in active intervention such as surgery. CXR 10/17/13 IMPRESSION:  Stable and chronic findings. No acute cardiopulmonary disease.  Electronically Signed  By: Margaree Mackintosh M.D.  On: 10/17/2013 15:35  ROS-see HPI Constitutional:   No-   weight loss, +night sweats, fevers, chills, fatigue, lassitude. HEENT:   No-  headaches, difficulty swallowing, tooth/dental problems, sore throat, + chronic hoarseness      No-  sneezing, itching, ear ache, nasal congestion, post nasal drip,  CV:  No-   chest pain, orthopnea, PND, swelling in lower extremities, anasarca,  dizziness, palpitations Resp: +  shortness of breath with exertion or at rest.              + productive cough,  No non-productive cough,  + coughing up of blood.              No-   change in color of mucus.  No- wheezing.   Skin: No-   rash or lesions. GI:  + heartburn, indigestion, No-abdominal pain, nausea, No-vomiting,  GU: MS:  No-   joint pain or swelling. + Back pain   Neuro-     nothing unusual Psych:  No- change in mood or affect. No depression or anxiety.  No memory loss.  Objective:   Physical Exam General- Alert, Oriented, Affect-appropriate, Distress- none acute  Slender, talkative, Skin- rash-none, lesions- none, excoriation- none Lymphadenopathy- none Head- atraumatic            Eyes- Gross vision intact, PERRLA, conjunctivae clear secretions            Ears- Hearing, canals normal            Nose- Clear, No-Septal dev, mucus, polyps, erosion, perforation             Throat- Mallampati II , mucosa clear/ dry , drainage- none,  tonsils- atrophy, +chronic                        hoarseness Neck- flexible , trachea midline, no stridor , thyroid nl, carotid no bruit Chest - symmetrical excursion , unlabored           Heart/CV- RRR now , no murmur , no gallop  , no rub, nl s1 s2                           - JVD- none , edema- none, stasis changes- none, varices- none           Lung- +wet squeaks right base persistent, wheeze- none, cough+light , dullness-none,                              rub- none           Chest wall-  Abd-  Br/ Gen/ Rectal- Not done, not indicated Extrem- cyanosis- none, clubbing, none, atrophy- none, strength- nl Neuro- grossly intact to observation

## 2014-05-12 DIAGNOSIS — H25013 Cortical age-related cataract, bilateral: Secondary | ICD-10-CM | POA: Diagnosis not present

## 2014-05-12 DIAGNOSIS — H2513 Age-related nuclear cataract, bilateral: Secondary | ICD-10-CM | POA: Diagnosis not present

## 2014-05-12 DIAGNOSIS — H25043 Posterior subcapsular polar age-related cataract, bilateral: Secondary | ICD-10-CM | POA: Diagnosis not present

## 2014-05-12 DIAGNOSIS — H18413 Arcus senilis, bilateral: Secondary | ICD-10-CM | POA: Diagnosis not present

## 2014-05-18 DIAGNOSIS — D3142 Benign neoplasm of left ciliary body: Secondary | ICD-10-CM | POA: Diagnosis not present

## 2014-05-18 DIAGNOSIS — H25813 Combined forms of age-related cataract, bilateral: Secondary | ICD-10-CM | POA: Diagnosis not present

## 2014-06-02 DIAGNOSIS — Z681 Body mass index (BMI) 19 or less, adult: Secondary | ICD-10-CM | POA: Diagnosis not present

## 2014-06-02 DIAGNOSIS — K219 Gastro-esophageal reflux disease without esophagitis: Secondary | ICD-10-CM | POA: Diagnosis not present

## 2014-06-02 DIAGNOSIS — J449 Chronic obstructive pulmonary disease, unspecified: Secondary | ICD-10-CM | POA: Diagnosis not present

## 2014-06-02 DIAGNOSIS — H2512 Age-related nuclear cataract, left eye: Secondary | ICD-10-CM | POA: Diagnosis not present

## 2014-06-02 DIAGNOSIS — E039 Hypothyroidism, unspecified: Secondary | ICD-10-CM | POA: Diagnosis not present

## 2014-06-02 DIAGNOSIS — I10 Essential (primary) hypertension: Secondary | ICD-10-CM | POA: Diagnosis not present

## 2014-06-02 DIAGNOSIS — H25812 Combined forms of age-related cataract, left eye: Secondary | ICD-10-CM | POA: Diagnosis not present

## 2014-06-03 DIAGNOSIS — Z961 Presence of intraocular lens: Secondary | ICD-10-CM | POA: Diagnosis not present

## 2014-06-03 DIAGNOSIS — Z9842 Cataract extraction status, left eye: Secondary | ICD-10-CM | POA: Diagnosis not present

## 2014-06-11 ENCOUNTER — Telehealth: Payer: Self-pay | Admitting: Internal Medicine

## 2014-06-11 MED ORDER — TIOTROPIUM BROMIDE MONOHYDRATE 18 MCG IN CAPS: ORAL_CAPSULE | RESPIRATORY_TRACT | Status: DC

## 2014-06-11 NOTE — Telephone Encounter (Signed)
Pt called back and she is requesting that the spriva be sent to the local pharmacy.  She stated that the mailorder has still not sent out her 90 day supply yet.   Pt is aware and nothing further is needed,

## 2014-06-11 NOTE — Telephone Encounter (Signed)
lmtcb on home and cell

## 2014-06-29 DIAGNOSIS — L821 Other seborrheic keratosis: Secondary | ICD-10-CM | POA: Diagnosis not present

## 2014-06-29 DIAGNOSIS — L820 Inflamed seborrheic keratosis: Secondary | ICD-10-CM | POA: Diagnosis not present

## 2014-08-03 DIAGNOSIS — H2511 Age-related nuclear cataract, right eye: Secondary | ICD-10-CM | POA: Diagnosis not present

## 2014-08-03 DIAGNOSIS — H25811 Combined forms of age-related cataract, right eye: Secondary | ICD-10-CM | POA: Diagnosis not present

## 2014-08-03 DIAGNOSIS — H25011 Cortical age-related cataract, right eye: Secondary | ICD-10-CM | POA: Diagnosis not present

## 2014-08-14 ENCOUNTER — Telehealth: Payer: Self-pay | Admitting: Internal Medicine

## 2014-08-14 MED ORDER — HYDROCOD POLST-CHLORPHEN POLST 10-8 MG/5ML PO LQCR: 5.0000 mL | Freq: Two times a day (BID) | ORAL | Status: DC | PRN

## 2014-08-14 NOTE — Telephone Encounter (Signed)
atc pt to make aware, line rang with no option to leave vm.  Wcb.  rx signed and placed up front.

## 2014-08-14 NOTE — Telephone Encounter (Signed)
Ok to refill cough syrup 

## 2014-08-14 NOTE — Telephone Encounter (Signed)
Last OV 04/20/14 Last rx was on 04/20/14 #249mL  CY - please advise on refill. Thanks.

## 2014-08-17 NOTE — Telephone Encounter (Signed)
Pt is aware that rx is ready to be picked up.

## 2014-08-20 ENCOUNTER — Telehealth: Payer: Self-pay | Admitting: Internal Medicine

## 2014-08-20 NOTE — Telephone Encounter (Signed)
[name] ° °

## 2014-08-26 ENCOUNTER — Telehealth: Payer: Self-pay | Admitting: Internal Medicine

## 2014-08-26 NOTE — Telephone Encounter (Signed)
Did script for tussionex get left up front? Ok to repeat the tussionex script.. Please verify mail address

## 2014-08-26 NOTE — Telephone Encounter (Signed)
Spoke with pt. She is referring to the Tussinex prescription. It was mailed out on 08/17/14, pt has not received it. She would like for [place] to mail another one to her.  CY - please advise. Thanks.

## 2014-08-27 MED ORDER — HYDROCOD POLST-CHLORPHEN POLST 10-8 MG/5ML PO LQCR: 5.0000 mL | Freq: Two times a day (BID) | ORAL | Status: DC | PRN

## 2014-08-27 NOTE — Telephone Encounter (Signed)
Checked up front and did not see anything to be picked up for this pt.  Spoke with pt and instructed that we will mail a new rx for Tussionex per Dr young.  Address verified.  Rx printed and placed for Dr young to sign.

## 2014-09-15 ENCOUNTER — Telehealth: Payer: Self-pay | Admitting: Internal Medicine

## 2014-09-15 MED ORDER — TIOTROPIUM BROMIDE MONOHYDRATE 18 MCG IN CAPS: ORAL_CAPSULE | RESPIRATORY_TRACT | Status: DC

## 2014-09-15 NOTE — Telephone Encounter (Signed)
Rx has been sent in. Pt is aware. Nothing further was needed. 

## 2014-10-14 DIAGNOSIS — M810 Age-related osteoporosis without current pathological fracture: Secondary | ICD-10-CM | POA: Diagnosis not present

## 2014-10-14 DIAGNOSIS — F419 Anxiety disorder, unspecified: Secondary | ICD-10-CM | POA: Diagnosis not present

## 2014-10-14 DIAGNOSIS — Z136 Encounter for screening for cardiovascular disorders: Secondary | ICD-10-CM | POA: Diagnosis not present

## 2014-10-14 DIAGNOSIS — I10 Essential (primary) hypertension: Secondary | ICD-10-CM | POA: Diagnosis not present

## 2014-10-14 DIAGNOSIS — J479 Bronchiectasis, uncomplicated: Secondary | ICD-10-CM | POA: Diagnosis not present

## 2014-10-14 DIAGNOSIS — Z0001 Encounter for general adult medical examination with abnormal findings: Secondary | ICD-10-CM | POA: Diagnosis not present

## 2014-10-14 DIAGNOSIS — Z79899 Other long term (current) drug therapy: Secondary | ICD-10-CM | POA: Diagnosis not present

## 2014-10-14 DIAGNOSIS — E039 Hypothyroidism, unspecified: Secondary | ICD-10-CM | POA: Diagnosis not present

## 2014-10-14 DIAGNOSIS — M5136 Other intervertebral disc degeneration, lumbar region: Secondary | ICD-10-CM | POA: Diagnosis not present

## 2014-10-20 ENCOUNTER — Ambulatory Visit: Payer: Medicare Other | Admitting: Internal Medicine

## 2014-10-22 ENCOUNTER — Ambulatory Visit (INDEPENDENT_AMBULATORY_CARE_PROVIDER_SITE_OTHER)
Admission: RE | Admit: 2014-10-22 | Discharge: 2014-10-22 | Disposition: A | Discharge status: Home / Self Care | Payer: Medicare Other | Source: Ambulatory Visit | Attending: Internal Medicine | Admitting: Internal Medicine

## 2014-10-22 ENCOUNTER — Encounter (INDEPENDENT_AMBULATORY_CARE_PROVIDER_SITE_OTHER): Payer: Self-pay

## 2014-10-22 ENCOUNTER — Encounter: Payer: Self-pay | Admitting: Internal Medicine

## 2014-10-22 ENCOUNTER — Telehealth: Payer: Self-pay | Admitting: Internal Medicine

## 2014-10-22 ENCOUNTER — Ambulatory Visit (INDEPENDENT_AMBULATORY_CARE_PROVIDER_SITE_OTHER): Payer: Medicare Other | Admitting: Internal Medicine

## 2014-10-22 VITALS — BP 128/60 | HR 86 | Ht 63.0 in | Wt 99.2 lb

## 2014-10-22 DIAGNOSIS — J471 Bronchiectasis with (acute) exacerbation: Secondary | ICD-10-CM

## 2014-10-22 DIAGNOSIS — J3800 Paralysis of vocal cords and larynx, unspecified: Secondary | ICD-10-CM

## 2014-10-22 DIAGNOSIS — R0602 Shortness of breath: Secondary | ICD-10-CM | POA: Diagnosis not present

## 2014-10-22 DIAGNOSIS — R05 Cough: Secondary | ICD-10-CM | POA: Diagnosis not present

## 2014-10-22 MED ORDER — BECLOMETHASONE DIPROPIONATE 40 MCG/ACT IN AERS: 2.0000 | INHALATION_SPRAY | Freq: Two times a day (BID) | RESPIRATORY_TRACT | Status: DC

## 2014-10-22 NOTE — Progress Notes (Signed)
Patient ID: [name], female    DOB: [birth year], 81 y.o.   MRN: [id number]  HPI 10/03/10- 62 yoF never smoker followed here for bronchiectasis, complicated by chronic vocal cord paresis, with hx MAIC- treated. Last here April 04, 2010. Had 2 episodes of laryngitis. Has had at least 2 x pneumovax. She now reports feeling well and doing well. Voice is always weak and hoarse. Denies choke or strangle with meals. Mild pill dysphagia. Denies waking from sleep choking. Does c/o "drainage in throat" when she lies down. Seasonal sneezing. Denies shortness of breath, chest pain or palpitation. Daily productive cough- usually dark, occasionally with streak heme. Has tusionex, used sparingly. Denies fever or night sweats.   02/01/11- 89 yoF never smoker followed here for bronchiectasis, complicated by chronic vocal cord paresis, with hx MAIC- treated.  She indicates she is doing ok. Shows me her current meds - Qvar 40, Atrovent HFA, Spiriva.  Saw Dr Janace Hoard ENT- he discussed putting stent in vocal cord, but she says she has had this voice 30 years (1978- had vocal cord nodules- surgery damaged nerve) and is wary of possibly losing voice if surgery went wrong. Discussed long term considerations of cough and airway protection, availability of second opinion. She is going back for laryngoscopy. Breathing ok except hot weather drives her indoors. Coughing less.  CXR 10/03/10- Stable bronchiectasis back to 2008, NAD.  Sputum cx for AFB negative.   08/09/11- 62 yoF never smoker followed here for bronchiectasis, complicated by chronic vocal cord paresis, with hx MAIC- treated. Daughter here today [name] gives me a somewhat rambling account of problems she's had to this fall and winter. Back pain was treated with steroid injection and some benefit but while she was hurting it took her breath. Narcotic pain medicines made her nauseated in December and January and she lost weight then. She saw Dr. Verita Schneiders for  followup to discuss what he might offer for surgical treatment of her chronic cord paresis. She decided to defer any intervention. In mid January she had an acute respiratory illness with sore throat for 3 days but no fever. Her primary physician treated with prednisone which made her very agitated, and a Z-Pak. In the last day or 2 her blood pressure has been labile if recorded correctly, down to 50 systolic and up to 332. She doesn't feel irregular palpitation and has not had syncope but she dropped off her blood pressure medicine for the last 2 days.  11/23/11- 26 yoF never smoker followed here for bronchiectasis, complicated by chronic vocal cord paresis, with hx MAIC- treated. Can tell more SOB and wheezing than usual; hoarseness as well Dr Dorthy Cooler gave Z-Pak and steroids in March for exacerbation of bronchitis.  Occasional night sweats and blood-streaked sputum, not progressive. She asks suggestions for management of muscle spasm. Taking Xanax. CXR 08/22/11 IMPRESSION:  No acute finding. Stable compared to prior examinations.  Original Report Authenticated By: Arvid Right. D'ALESSIO, M.D.    02/29/12- 62 yoF never smoker followed here for bronchiectasis, complicated by chronic vocal cord paresis, with hx MAIC- treated. SOB getting worse since last Thursday-started getting sick-contacted PCP office and gave Doxycycline and prednsione(completed) 1 week ago had sore throat with no fever. Primary physician started her on prednisone 40 mg daily x7 days, with doxycycline. She has a chronic history of persistent nausea and gas, starting before the doxycycline. She is about back to her respiratory baseline exertional dyspnea, little dry cough.  09/18/12- 82 yoF never  smoker followed here for bronchiectasis, complicated by chronic vocal cord paresis, with hx MAIC- treated. FOLLOWS FOR: gets more SOB than usual. Breathing has not gotten any better since last visit. She tells me reading is not really  different. Occasional coughing spells with no long-term change. Productive of white mucus with no blood. Several episodes of laryngitis this winter. Uses Spiriva and occasionally Atrovent. We discussed that overlap but she does not tolerate stimulants. We discussed her steroid Qvar. She is not walking much outside because of the weather and realizes she loses strength when she can't exercise. She has been told she has a hiatal hernia. We have talked about reflux and aspiration many times. She practices precautions as instructed.  03/21/13-82 yoF never smoker followed here for bronchiectasis, complicated by chronic vocal cord paresis, with hx MAIC- treated. FOLLOWS FOR:  Breathing is unchanged since last OV.  Would like to discuss medications Aware of reflux and we discussed the significance given her vocal cord paralysis. She continues to use Spiriva and Qvar 40. Treats chronic insomnia with Xanax 0.5 mg and amitriptyline 50 mg CXR 09/18/12 IMPRESSION:  No acute abnormality. Stable compared to prior exam.  Original Report Authenticated By: Orlean Patten, M.D.  10/17/13- 68 yoF never smoker followed here for bronchiectasis, complicated by chronic vocal cord paresis, with hx MAIC- treated. follows for:  breathing has been about the same since her last ov.  pt has been walking and wanted Dr. Annamaria Boots to be aware.  pt has cut way back on her pain meds.  Insomnia-sleeps well with amitriptyline +1/2 Xanax Daily productive cough with clear to dark sputum and rare blood streak. Continues Spiriva, Qvar and occasional Atrovent rescue inhaler  04/20/14-83 yoF never smoker followed here for bronchiectasis with hx MAIC- treated, intermittent hemoptysis,  complicated by chronic vocal cord paresis, Hashimoto's thyroiditis, GERD, Insomnia FOLLOW FOR:  Bronchiectasis; discuss surgery for paralyzed vocal chords, coughs up blood periodically     Daughter here               has had flu shot  Dymista nasal spray did help  bothersome postnasal drainage. She asks for allergy assessment. Taking Tussionex and Xanax to sleep through the night without cough. I expressed concern about oversedation at her age and discussed management. Cough productive white phlegm occasional tinge of blood. She feels her most bothersome complaint is her chronic hoarseness for which she has seen Dr. Janace Hoard ENT again. She is not interested in active intervention such as surgery. CXR 10/17/13 IMPRESSION:  Stable and chronic findings. No acute cardiopulmonary disease.  Electronically Signed  By: Margaree Mackintosh M.D.  On: 10/17/2013 15:35  10/22/14- 41 yoF never smoker followed here for bronchiectasis with hx MAIC- treated, intermittent hemoptysis,  complicated by chronic vocal cord paresis, Hashimoto's thyroiditis, GERD, Insomnia FOLLOWS FOR: Pt reports some change in breathing since last Ov- pt has increasd her exercise, building stamina.  Not walking outside on cooler days. Some increased cough and wheeze. Still occasionally coughs a streak of blood. This has gone on for years. Tussionex at night prevents cough that wakes her.  ROS-see HPI Constitutional:   No-   weight loss, +night sweats, fevers, chills, fatigue, lassitude. HEENT:   No-  headaches, difficulty swallowing, tooth/dental problems, sore throat, + chronic hoarseness      No-  sneezing, itching, ear ache, nasal congestion, post nasal drip,  CV:  No-   chest pain, orthopnea, PND, swelling in lower extremities, anasarca,  dizziness, palpitations Resp: +  shortness  of breath with exertion or at rest.              + productive cough,  No non-productive cough,  + coughing up of blood.              No-   change in color of mucus.  No- wheezing.   Skin: No-   rash or lesions. GI:  + heartburn, indigestion, No-abdominal pain, nausea, No-vomiting,  GU: MS:  No-   joint pain or swelling. + Back pain   Neuro-     nothing unusual Psych:  No- change in mood or affect. No depression or  anxiety.  No memory loss.  Objective:   Physical Exam General- Alert, Oriented, Affect-appropriate, Distress- none acute  Slender, talkative, Skin- rash-none, lesions- none, excoriation- none Lymphadenopathy- none Head- atraumatic            Eyes- Gross vision intact, PERRLA, conjunctivae clear secretions            Ears- Hearing, canals normal            Nose- Clear, No-Septal dev, mucus, polyps, erosion, perforation             Throat- Mallampati II , mucosa clear/ dry , drainage- none, tonsils- atrophy, +chronic hoarseness Neck- flexible , trachea midline, no stridor , thyroid nl, carotid no bruit Chest - symmetrical excursion , unlabored           Heart/CV- RRR now , no murmur , no gallop  , no rub, nl s1 s2                           - JVD- none , edema- none, stasis changes- none, varices- none           Lung- + basilar crackles, wheeze- none, cough+light , dullness-none,  rub- none           Chest wall-  Abd-  Br/ Gen/ Rectal- Not done, not indicated Extrem- cyanosis- none, clubbing, none, atrophy- none, strength- nl Neuro- grossly intact to observation

## 2014-10-22 NOTE — Telephone Encounter (Signed)
[phone], pt cb

## 2014-10-22 NOTE — Telephone Encounter (Signed)
lmomtcb x1 

## 2014-10-22 NOTE — Telephone Encounter (Signed)
Result Note     CXR - stable emphysema and scarring changes. There is one nodular area that we will just watch on future chest xray as well   I spoke with patient about results and she verbalized understanding and had no questions. Pt was calling about CXR not bronch. Nothing further needed

## 2014-10-22 NOTE — Patient Instructions (Addendum)
Order- CXR   Dx bronchiectasis with exacerbation  Suggest you try otc Zzquil at bedtime if needed for sleep. We want to see if you can get by without the Tussionex  Ok to use Dymista  Refill script for Qvar mail order sent

## 2014-10-26 ENCOUNTER — Telehealth: Payer: Self-pay | Admitting: Internal Medicine

## 2014-10-26 NOTE — Telephone Encounter (Signed)
Result Note      CXR - stable emphysema and scarring changes. There is one nodular area that we will just watch on future chest xray as well         Re-reviewed these results with pt.  Nothing further needed.

## 2014-11-18 ENCOUNTER — Telehealth: Payer: Self-pay | Admitting: Internal Medicine

## 2014-11-18 MED ORDER — HYDROCOD POLST-CPM POLST ER 10-8 MG/5ML PO SUER: 5.0000 mL | Freq: Two times a day (BID) | ORAL | Status: DC | PRN

## 2014-11-18 NOTE — Telephone Encounter (Signed)
Ok to refill tussionex 

## 2014-11-18 NOTE — Telephone Encounter (Signed)
rx printed and placed on CY's cart for signature.  Verified address, pt aware this will be placed in the mail today.  Nothing further needed.

## 2014-11-18 NOTE — Telephone Encounter (Signed)
Spoke with the pt  She states that she is needing refill on her tussionex- last filled 08/27/14 # 200 ml no rf  She tried zquil and this only helped for a few hours, would wake up in the night coughing  She states cough is no worse since her last visit, and she has no new respiratory symptoms  Last ov 10/22/14  Next ov 04/26/15  Allergies  Allergen Reactions  . Asa [Aspirin]     Spits up blood  . Boniva [Ibandronic Acid]     GI upset  . Doxycycline Nausea Only and Hypertension   Current Outpatient Prescriptions on File Prior to Visit  Medication Sig Dispense Refill  . ALPRAZolam (XANAX) 0.5 MG tablet Take 0.5 mg by mouth at bedtime as needed. Take 1/2 by mouth three times a day as needed    . amitriptyline (ELAVIL) 50 MG tablet 1 tablet daily at 10 pm.    . amLODipine (NORVASC) 5 MG tablet Take 5 mg by mouth daily.      . Azelastine-Fluticasone (DYMISTA) 137-50 MCG/ACT SUSP Place 1-2 sprays into the nose daily. 23 g prn  . beclomethasone (QVAR) 40 MCG/ACT inhaler Inhale 2 puffs into the lungs 2 (two) times daily. Rinse mouth 3 Inhaler 3  . Calcium Carbonate-Vit D-Min (CALCIUM 1200 PO) Take 1 capsule by mouth daily.    . celecoxib (CELEBREX) 200 MG capsule Take 200 mg by mouth 2 (two) times daily.    . chlorpheniramine-HYDROcodone (TUSSIONEX PENNKINETIC ER) 10-8 MG/5ML LQCR Take 5 mLs by mouth every 12 (twelve) hours as needed. 200 mL 0  . cholecalciferol (VITAMIN D) 1000 UNITS tablet Take 1,000 Units by mouth daily.    Marland Kitchen ipratropium (ATROVENT HFA) 17 MCG/ACT inhaler Inhale 2 puffs into the lungs 4 (four) times daily as needed for wheezing. 1 Inhaler prn  . levothyroxine (SYNTHROID, LEVOTHROID) 88 MCG tablet Take 88 mcg by mouth daily.      . Multiple Vitamin (MULTIVITAMIN) tablet Take 1 tablet by mouth daily.    Marland Kitchen omeprazole (PRILOSEC) 20 MG capsule TAKE ONE CAPSULE BY MOUTH EVERY DAY 30 capsule 1  . promethazine (PHENERGAN) 12.5 MG tablet Take 12.5 mg by mouth every 6 (six) hours as  needed.    . Psyllium (METAMUCIL) 30.9 % POWD Take by mouth.    . Spacer/Aero-Holding Chambers (AEROCHAMBER MV) inhaler Use as instructed 1 each 0  . tiotropium (SPIRIVA HANDIHALER) 18 MCG inhalation capsule PLACE 1 CAPSULE INTO INHALER AND INHALE DAILY 90 capsule 1  . valACYclovir (VALTREX) 1000 MG tablet Take 2 now and 2 in 12 hours for cold sores.     . valsartan-hydrochlorothiazide (DIOVAN-HCT) 320-25 MG per tablet Take 1 tablet by mouth daily.       No current facility-administered medications on file prior to visit.

## 2015-01-22 NOTE — Assessment & Plan Note (Signed)
Probable ongoing low-grade chronic bronchitis and very slow progression of bronchiectasis. Her dyspnea now also reflects her age 81 years old Plan-suggest trial of OTC Zzquil for sleep onset of Tussionex, refill Qvar

## 2015-01-22 NOTE — Assessment & Plan Note (Signed)
Her voice sounds the same and I don't think there has been progression. She is aware of chronic increased risk of aspiration and is careful when swallowing.

## 2015-01-26 DIAGNOSIS — I10 Essential (primary) hypertension: Secondary | ICD-10-CM | POA: Diagnosis not present

## 2015-01-26 DIAGNOSIS — G4700 Insomnia, unspecified: Secondary | ICD-10-CM | POA: Diagnosis not present

## 2015-01-26 DIAGNOSIS — M2660 Temporomandibular joint disorder, unspecified: Secondary | ICD-10-CM | POA: Diagnosis not present

## 2015-03-10 ENCOUNTER — Other Ambulatory Visit: Payer: Self-pay | Admitting: Internal Medicine

## 2015-03-22 DIAGNOSIS — N390 Urinary tract infection, site not specified: Secondary | ICD-10-CM | POA: Diagnosis not present

## 2015-03-22 DIAGNOSIS — R300 Dysuria: Secondary | ICD-10-CM | POA: Diagnosis not present

## 2015-04-14 ENCOUNTER — Other Ambulatory Visit: Payer: Self-pay | Admitting: Internal Medicine

## 2015-04-15 DIAGNOSIS — N941 Unspecified dyspareunia: Secondary | ICD-10-CM | POA: Diagnosis not present

## 2015-04-15 DIAGNOSIS — Z113 Encounter for screening for infections with a predominantly sexual mode of transmission: Secondary | ICD-10-CM | POA: Diagnosis not present

## 2015-04-15 DIAGNOSIS — R300 Dysuria: Secondary | ICD-10-CM | POA: Diagnosis not present

## 2015-04-15 DIAGNOSIS — N952 Postmenopausal atrophic vaginitis: Secondary | ICD-10-CM | POA: Diagnosis not present

## 2015-04-20 DIAGNOSIS — E039 Hypothyroidism, unspecified: Secondary | ICD-10-CM | POA: Diagnosis not present

## 2015-04-20 DIAGNOSIS — F419 Anxiety disorder, unspecified: Secondary | ICD-10-CM | POA: Diagnosis not present

## 2015-04-20 DIAGNOSIS — Z23 Encounter for immunization: Secondary | ICD-10-CM | POA: Diagnosis not present

## 2015-04-20 DIAGNOSIS — I10 Essential (primary) hypertension: Secondary | ICD-10-CM | POA: Diagnosis not present

## 2015-04-20 DIAGNOSIS — J479 Bronchiectasis, uncomplicated: Secondary | ICD-10-CM | POA: Diagnosis not present

## 2015-04-20 DIAGNOSIS — R410 Disorientation, unspecified: Secondary | ICD-10-CM | POA: Diagnosis not present

## 2015-04-23 ENCOUNTER — Other Ambulatory Visit: Payer: Self-pay | Admitting: Family Medicine

## 2015-04-23 DIAGNOSIS — R410 Disorientation, unspecified: Secondary | ICD-10-CM

## 2015-04-26 ENCOUNTER — Ambulatory Visit: Payer: Medicare Other | Admitting: Internal Medicine

## 2015-05-01 ENCOUNTER — Ambulatory Visit
Admission: RE | Admit: 2015-05-01 | Discharge: 2015-05-01 | Disposition: A | Discharge status: Home / Self Care | Payer: Medicare Other | Source: Ambulatory Visit | Attending: Family Medicine | Admitting: Family Medicine

## 2015-05-01 DIAGNOSIS — R410 Disorientation, unspecified: Secondary | ICD-10-CM | POA: Diagnosis not present

## 2015-05-07 DIAGNOSIS — I10 Essential (primary) hypertension: Secondary | ICD-10-CM | POA: Diagnosis not present

## 2015-05-07 DIAGNOSIS — R51 Headache: Secondary | ICD-10-CM | POA: Diagnosis not present

## 2015-05-11 ENCOUNTER — Other Ambulatory Visit: Payer: Self-pay | Admitting: Family Medicine

## 2015-05-11 DIAGNOSIS — R51 Headache: Principal | ICD-10-CM

## 2015-05-11 DIAGNOSIS — R519 Headache, unspecified: Secondary | ICD-10-CM

## 2015-05-19 ENCOUNTER — Ambulatory Visit
Admission: RE | Admit: 2015-05-19 | Discharge: 2015-05-19 | Disposition: A | Discharge status: Home / Self Care | Payer: Medicare Other | Source: Ambulatory Visit | Attending: Family Medicine | Admitting: Family Medicine

## 2015-05-19 ENCOUNTER — Other Ambulatory Visit: Payer: Medicare Other

## 2015-05-19 DIAGNOSIS — R51 Headache: Principal | ICD-10-CM

## 2015-05-19 DIAGNOSIS — R519 Headache, unspecified: Secondary | ICD-10-CM

## 2015-06-07 ENCOUNTER — Other Ambulatory Visit: Payer: Self-pay | Admitting: Internal Medicine

## 2015-07-08 ENCOUNTER — Ambulatory Visit (INDEPENDENT_AMBULATORY_CARE_PROVIDER_SITE_OTHER)
Admission: RE | Admit: 2015-07-08 | Discharge: 2015-07-08 | Disposition: A | Discharge status: Home / Self Care | Payer: Medicare Other | Source: Ambulatory Visit | Attending: Internal Medicine | Admitting: Internal Medicine

## 2015-07-08 ENCOUNTER — Ambulatory Visit (INDEPENDENT_AMBULATORY_CARE_PROVIDER_SITE_OTHER): Payer: Medicare Other | Admitting: Internal Medicine

## 2015-07-08 ENCOUNTER — Encounter: Payer: Self-pay | Admitting: Internal Medicine

## 2015-07-08 VITALS — BP 118/62 | HR 81 | Ht 63.0 in | Wt 96.6 lb

## 2015-07-08 DIAGNOSIS — J158 Pneumonia due to other specified bacteria: Secondary | ICD-10-CM

## 2015-07-08 DIAGNOSIS — J3800 Paralysis of vocal cords and larynx, unspecified: Secondary | ICD-10-CM

## 2015-07-08 DIAGNOSIS — J479 Bronchiectasis, uncomplicated: Secondary | ICD-10-CM

## 2015-07-08 MED ORDER — HYDROCOD POLST-CPM POLST ER 10-8 MG/5ML PO SUER: 5.0000 mL | Freq: Two times a day (BID) | ORAL | Status: DC | PRN

## 2015-07-08 NOTE — Progress Notes (Signed)
Patient ID: [name], female    DOB: [birth year], 81 y.o.   MRN: [id number]  HPI 10/03/10- 81 yoF never smoker followed here for bronchiectasis, complicated by chronic vocal cord paresis, with hx MAIC- treated. Last here April 04, 2010. Had 2 episodes of laryngitis. Has had at least 2 x pneumovax. She now reports feeling well and doing well. Voice is always weak and hoarse. Denies choke or strangle with meals. Mild pill dysphagia. Denies waking from sleep choking. Does c/o "drainage in throat" when she lies down. Seasonal sneezing. Denies shortness of breath, chest pain or palpitation. Daily productive cough- usually dark, occasionally with streak heme. Has tusionex, used sparingly. Denies fever or night sweats.   02/01/11- 81 yoF never smoker followed here for bronchiectasis, complicated by chronic vocal cord paresis, with hx MAIC- treated.  She indicates she is doing ok. Shows me her current meds - Qvar 40, Atrovent HFA, Spiriva.  Saw Dr Janace Hoard ENT- he discussed putting stent in vocal cord, but she says she has had this voice 30 years (1978- had vocal cord nodules- surgery damaged nerve) and is wary of possibly losing voice if surgery went wrong. Discussed long term considerations of cough and airway protection, availability of second opinion. She is going back for laryngoscopy. Breathing ok except hot weather drives her indoors. Coughing less.  CXR 10/03/10- Stable bronchiectasis back to 2008, NAD.  Sputum cx for AFB negative.   08/09/11- 81 yoF never smoker followed here for bronchiectasis, complicated by chronic vocal cord paresis, with hx MAIC- treated. Daughter here today [name] gives me a somewhat rambling account of problems she's had to this fall and winter. Back pain was treated with steroid injection and some benefit but while she was hurting it took her breath. Narcotic pain medicines made her nauseated in December and January and she lost weight then. She saw Dr. Verita Schneiders for  followup to discuss what he might offer for surgical treatment of her chronic cord paresis. She decided to defer any intervention. In mid January she had an acute respiratory illness with sore throat for 3 days but no fever. Her primary physician treated with prednisone which made her very agitated, and a Z-Pak. In the last day or 2 her blood pressure has been labile if recorded correctly, down to 50 systolic and up to 332. She doesn't feel irregular palpitation and has not had syncope but she dropped off her blood pressure medicine for the last 2 days.  11/23/11- 81 yoF never smoker followed here for bronchiectasis, complicated by chronic vocal cord paresis, with hx MAIC- treated. Can tell more SOB and wheezing than usual; hoarseness as well Dr Dorthy Cooler gave Z-Pak and steroids in March for exacerbation of bronchitis.  Occasional night sweats and blood-streaked sputum, not progressive. She asks suggestions for management of muscle spasm. Taking Xanax. CXR 08/22/11 IMPRESSION:  No acute finding. Stable compared to prior examinations.  Original Report Authenticated By: Arvid Right. D'ALESSIO, M.D.    02/29/12- 81 yoF never smoker followed here for bronchiectasis, complicated by chronic vocal cord paresis, with hx MAIC- treated. SOB getting worse since last Thursday-started getting sick-contacted PCP office and gave Doxycycline and prednsione(completed) 1 week ago had sore throat with no fever. Primary physician started her on prednisone 40 mg daily x7 days, with doxycycline. She has a chronic history of persistent nausea and gas, starting before the doxycycline. She is about back to her respiratory baseline exertional dyspnea, little dry cough.  09/18/12- 82 yoF never  smoker followed here for bronchiectasis, complicated by chronic vocal cord paresis, with hx MAIC- treated. FOLLOWS FOR: gets more SOB than usual. Breathing has not gotten any better since last visit. She tells me reading is not really  different. Occasional coughing spells with no long-term change. Productive of white mucus with no blood. Several episodes of laryngitis this winter. Uses Spiriva and occasionally Atrovent. We discussed that overlap but she does not tolerate stimulants. We discussed her steroid Qvar. She is not walking much outside because of the weather and realizes she loses strength when she can't exercise. She has been told she has a hiatal hernia. We have talked about reflux and aspiration many times. She practices precautions as instructed.  03/21/13-82 yoF never smoker followed here for bronchiectasis, complicated by chronic vocal cord paresis, with hx MAIC- treated. FOLLOWS FOR:  Breathing is unchanged since last OV.  Would like to discuss medications Aware of reflux and we discussed the significance given her vocal cord paralysis. She continues to use Spiriva and Qvar 40. Treats chronic insomnia with Xanax 0.5 mg and amitriptyline 50 mg CXR 09/18/12 IMPRESSION:  No acute abnormality. Stable compared to prior exam.  Original Report Authenticated By: Orlean Patten, M.D.  10/17/13- 79 yoF never smoker followed here for bronchiectasis, complicated by chronic vocal cord paresis, with hx MAIC- treated. follows for:  breathing has been about the same since her last ov.  pt has been walking and wanted Dr. Annamaria Boots to be aware.  pt has cut way back on her pain meds.  Insomnia-sleeps well with amitriptyline +1/2 Xanax Daily productive cough with clear to dark sputum and rare blood streak. Continues Spiriva, Qvar and occasional Atrovent rescue inhaler  04/20/14-83 yoF never smoker followed here for bronchiectasis with hx MAIC- treated, intermittent hemoptysis,  complicated by chronic vocal cord paresis, Hashimoto's thyroiditis, GERD, Insomnia FOLLOW FOR:  Bronchiectasis; discuss surgery for paralyzed vocal chords, coughs up blood periodically     Daughter here               has had flu shot  Dymista nasal spray did help  bothersome postnasal drainage. She asks for allergy assessment. Taking Tussionex and Xanax to sleep through the night without cough. I expressed concern about oversedation at her age and discussed management. Cough productive white phlegm occasional tinge of blood. She feels her most bothersome complaint is her chronic hoarseness for which she has seen Dr. Janace Hoard ENT again. She is not interested in active intervention such as surgery. CXR 10/17/13 IMPRESSION:  Stable and chronic findings. No acute cardiopulmonary disease.  Electronically Signed  By: Margaree Mackintosh M.D.  On: 10/17/2013 15:35  10/22/14- 74 yoF never smoker followed here for bronchiectasis with hx MAIC- treated, intermittent hemoptysis,  complicated by chronic vocal cord paresis, Hashimoto's thyroiditis, GERD, Insomnia FOLLOWS FOR: Pt reports some change in breathing since last Ov- pt has increasd her exercise, building stamina.  Not walking outside on cooler days. Some increased cough and wheeze. Still occasionally coughs a streak of blood. This has gone on for years. Tussionex at night prevents cough that wakes her.  07/08/2015-81 year old female never smoker followed here for bronchiectasis with history MAIC-treated, intermittent hemoptysis, complicated by chronic vocal cord paresis, Hashimoto's thyroiditis, GERD, insomnia FOLLOWS FOR: Pt states she has coughed up blood about 2-3 times a month with a coughing spell. Pt has hiatal hernia(controlled)     , Hemoptysis or dyspnea with exertion. Rare use of Tussionex. No fever. CXR 10/22/2014 IMPRESSION: 1. No definite active process.  Emphysema. 2. Coarse markings at the lung bases reflect changes of bronchiectasis when compared to prior CT images. 3. Nodular opacity in the periphery of the left upper lung field. Recommend attention to this area on followup chest x-ray or CT of the chest. Electronically Signed  By: Ivar Drape M.D.  On: 10/22/2014 16:04  ROS-see  HPI Constitutional:   No-   weight loss, +night sweats, fevers, chills, fatigue, lassitude. HEENT:   No-  headaches, difficulty swallowing, tooth/dental problems, sore throat, + chronic hoarseness      No-  sneezing, itching, ear ache, nasal congestion, post nasal drip,  CV:  No-   chest pain, orthopnea, PND, swelling in lower extremities, anasarca,  dizziness, palpitations Resp: +  shortness of breath with exertion or at rest.              + productive cough,  No non-productive cough,  + coughing up of blood.              No-   change in color of mucus.  No- wheezing.   Skin: No-   rash or lesions. GI:  + heartburn, indigestion, No-abdominal pain, nausea, No-vomiting,  GU: MS:  No-   joint pain or swelling. + Back pain   Neuro-     nothing unusual Psych:  No- change in mood or affect. No depression or anxiety.  No memory loss.  Objective:   Physical Exam General- Alert, Oriented, Affect-appropriate, Distress- none acute  Slender, talkative, Skin- rash-none, lesions- none, excoriation- none Lymphadenopathy- none Head- atraumatic            Eyes- Gross vision intact, PERRLA, conjunctivae clear secretions            Ears- Hearing, canals normal            Nose- Clear, No-Septal dev, mucus, polyps, erosion, perforation             Throat- Mallampati II , mucosa clear/ dry , drainage- none, tonsils- atrophy, +chronic hoarseness Neck- flexible , trachea midline, no stridor , thyroid nl, carotid no bruit Chest - symmetrical excursion , unlabored           Heart/CV- RRR now , no murmur , no gallop  , no rub, nl s1 s2                           - JVD- none , edema- none, stasis changes- none, varices- none           Lung- clear, wheeze- none, cough+light , dullness-none,  rub- none           Chest wall-  Abd-  Br/ Gen/ Rectal- Not done, not indicated Extrem- cyanosis- none, clubbing, none, atrophy- none, strength- nl Neuro- grossly intact to observation

## 2015-07-08 NOTE — Patient Instructions (Signed)
Refill for tusionex printed    Use the very smallest amount that helps with your coughing  Order- CXR-  Dx bronchiectasis

## 2015-07-09 ENCOUNTER — Telehealth: Payer: Self-pay | Admitting: Internal Medicine

## 2015-07-09 NOTE — Telephone Encounter (Signed)
Notes Recorded by Deneise Lever, MD on 07/09/2015 at 12:35 PM CXR-stable chronic changes similar to what we have seen before with old scarring. We continue to watch a small nodule in the upper left lung but nothing needs to be done about it at this time. We will discuss x-ray findings at our next office visit. Please call earlier if needed.  LVM for pt to return call.

## 2015-07-09 NOTE — Progress Notes (Signed)
Quick Note:  LVM for pt to return call ______ 

## 2015-07-12 NOTE — Assessment & Plan Note (Signed)
This is contributed to chronic low-grade aspiration and her chronic bronchiectasis/bronchitis pattern. She has had speech therapy and continues to follow swallowing precautions.

## 2015-07-12 NOTE — Assessment & Plan Note (Signed)
Key problem has been recurrent low-grade aspiration. She feels stable now. We discussed possibility of a pneumatic vest. Update chest x-ray

## 2015-07-12 NOTE — Assessment & Plan Note (Signed)
Mycobacterium avium intracellulare previously cultured and treated

## 2015-07-13 MED ORDER — AZELASTINE-FLUTICASONE 137-50 MCG/ACT NA SUSP: 1.0000 | Freq: Every day | NASAL | Status: DC

## 2015-07-13 NOTE — Telephone Encounter (Signed)
[phone] pt calling back

## 2015-07-13 NOTE — Telephone Encounter (Signed)
Patient aware of CXR results. Patient requesting refill on Dymista (her prescription expired) Rx sent to pharmacy. Patient aware.

## 2015-07-13 NOTE — Telephone Encounter (Signed)
Pt needed results explained again.  Expressed understanding. Nothing further needed.

## 2015-08-23 ENCOUNTER — Other Ambulatory Visit: Payer: Self-pay | Admitting: Internal Medicine

## 2015-10-01 ENCOUNTER — Other Ambulatory Visit: Payer: Self-pay | Admitting: Internal Medicine

## 2015-10-02 ENCOUNTER — Other Ambulatory Visit: Payer: Self-pay | Admitting: Internal Medicine

## 2015-10-06 DIAGNOSIS — L821 Other seborrheic keratosis: Secondary | ICD-10-CM | POA: Diagnosis not present

## 2015-10-06 DIAGNOSIS — B009 Herpesviral infection, unspecified: Secondary | ICD-10-CM | POA: Diagnosis not present

## 2015-10-06 DIAGNOSIS — D225 Melanocytic nevi of trunk: Secondary | ICD-10-CM | POA: Diagnosis not present

## 2015-10-06 DIAGNOSIS — L281 Prurigo nodularis: Secondary | ICD-10-CM | POA: Diagnosis not present

## 2015-10-06 DIAGNOSIS — Z1283 Encounter for screening for malignant neoplasm of skin: Secondary | ICD-10-CM | POA: Diagnosis not present

## 2015-10-06 DIAGNOSIS — D1801 Hemangioma of skin and subcutaneous tissue: Secondary | ICD-10-CM | POA: Diagnosis not present

## 2015-10-06 DIAGNOSIS — L820 Inflamed seborrheic keratosis: Secondary | ICD-10-CM | POA: Diagnosis not present

## 2015-11-02 DIAGNOSIS — Z0001 Encounter for general adult medical examination with abnormal findings: Secondary | ICD-10-CM | POA: Diagnosis not present

## 2015-11-02 DIAGNOSIS — E039 Hypothyroidism, unspecified: Secondary | ICD-10-CM | POA: Diagnosis not present

## 2015-11-02 DIAGNOSIS — E7800 Pure hypercholesterolemia, unspecified: Secondary | ICD-10-CM | POA: Diagnosis not present

## 2015-11-02 DIAGNOSIS — J479 Bronchiectasis, uncomplicated: Secondary | ICD-10-CM | POA: Diagnosis not present

## 2015-11-02 DIAGNOSIS — Z79899 Other long term (current) drug therapy: Secondary | ICD-10-CM | POA: Diagnosis not present

## 2015-11-02 DIAGNOSIS — I10 Essential (primary) hypertension: Secondary | ICD-10-CM | POA: Diagnosis not present

## 2015-11-02 DIAGNOSIS — M810 Age-related osteoporosis without current pathological fracture: Secondary | ICD-10-CM | POA: Diagnosis not present

## 2015-11-02 DIAGNOSIS — F411 Generalized anxiety disorder: Secondary | ICD-10-CM | POA: Diagnosis not present

## 2016-01-06 ENCOUNTER — Encounter: Payer: Self-pay | Admitting: Internal Medicine

## 2016-01-06 ENCOUNTER — Ambulatory Visit (INDEPENDENT_AMBULATORY_CARE_PROVIDER_SITE_OTHER): Payer: Medicare Other | Admitting: Internal Medicine

## 2016-01-06 ENCOUNTER — Ambulatory Visit (INDEPENDENT_AMBULATORY_CARE_PROVIDER_SITE_OTHER)
Admission: RE | Admit: 2016-01-06 | Discharge: 2016-01-06 | Disposition: A | Discharge status: Home / Self Care | Payer: Medicare Other | Source: Ambulatory Visit | Attending: Internal Medicine | Admitting: Internal Medicine

## 2016-01-06 VITALS — BP 120/60 | HR 80 | Ht 63.0 in | Wt 101.0 lb

## 2016-01-06 DIAGNOSIS — J479 Bronchiectasis, uncomplicated: Secondary | ICD-10-CM

## 2016-01-06 DIAGNOSIS — K219 Gastro-esophageal reflux disease without esophagitis: Secondary | ICD-10-CM

## 2016-01-06 DIAGNOSIS — R05 Cough: Secondary | ICD-10-CM | POA: Diagnosis not present

## 2016-01-06 DIAGNOSIS — J3800 Paralysis of vocal cords and larynx, unspecified: Secondary | ICD-10-CM | POA: Diagnosis not present

## 2016-01-06 DIAGNOSIS — R0602 Shortness of breath: Secondary | ICD-10-CM | POA: Diagnosis not present

## 2016-01-06 MED ORDER — AZITHROMYCIN 250 MG PO TABS: ORAL_TABLET | ORAL | Status: DC

## 2016-01-06 MED ORDER — TIOTROPIUM BROMIDE MONOHYDRATE 18 MCG IN CAPS: 1.0000 | ORAL_CAPSULE | Freq: Every day | RESPIRATORY_TRACT | Status: DC

## 2016-01-06 MED ORDER — OMEPRAZOLE 20 MG PO CPDR: 20.0000 mg | DELAYED_RELEASE_CAPSULE | Freq: Every day | ORAL | Status: DC

## 2016-01-06 MED ORDER — AZELASTINE-FLUTICASONE 137-50 MCG/ACT NA SUSP: 1.0000 | Freq: Every day | NASAL | Status: DC

## 2016-01-06 NOTE — Patient Instructions (Addendum)
Refill scripts sent for Spiriva and omeprazole  Script to  [facility] for Zpak antibiotic  Order- CXR    Dx exacerbation bronchiectasis  Please call as needed

## 2016-01-06 NOTE — Progress Notes (Signed)
Patient ID: [name], female    DOB: [birth year], 82 y.o.   MRN: [id number]  HPI 10/03/10- 62 yoF never smoker followed here for bronchiectasis, complicated by chronic vocal cord paresis, with hx MAIC- treated. Last here April 04, 2010. Had 2 episodes of laryngitis. Has had at least 2 x pneumovax. She now reports feeling well and doing well. Voice is always weak and hoarse. Denies choke or strangle with meals. Mild pill dysphagia. Denies waking from sleep choking. Does c/o "drainage in throat" when she lies down. Seasonal sneezing. Denies shortness of breath, chest pain or palpitation. Daily productive cough- usually dark, occasionally with streak heme. Has tusionex, used sparingly. Denies fever or night sweats.   02/01/11- 89 yoF never smoker followed here for bronchiectasis, complicated by chronic vocal cord paresis, with hx MAIC- treated.  She indicates she is doing ok. Shows me her current meds - Qvar 40, Atrovent HFA, Spiriva.  Saw Dr Janace Hoard ENT- he discussed putting stent in vocal cord, but she says she has had this voice 30 years (1978- had vocal cord nodules- surgery damaged nerve) and is wary of possibly losing voice if surgery went wrong. Discussed long term considerations of cough and airway protection, availability of second opinion. She is going back for laryngoscopy. Breathing ok except hot weather drives her indoors. Coughing less.  CXR 10/03/10- Stable bronchiectasis back to 2008, NAD.  Sputum cx for AFB negative.   08/09/11- 62 yoF never smoker followed here for bronchiectasis, complicated by chronic vocal cord paresis, with hx MAIC- treated. Daughter here today [name] gives me a somewhat rambling account of problems she's had to this fall and winter. Back pain was treated with steroid injection and some benefit but while she was hurting it took her breath. Narcotic pain medicines made her nauseated in December and January and she lost weight then. She saw Dr. Verita Schneiders for  followup to discuss what he might offer for surgical treatment of her chronic cord paresis. She decided to defer any intervention. In mid January she had an acute respiratory illness with sore throat for 3 days but no fever. Her primary physician treated with prednisone which made her very agitated, and a Z-Pak. In the last day or 2 her blood pressure has been labile if recorded correctly, down to 50 systolic and up to 332. She doesn't feel irregular palpitation and has not had syncope but she dropped off her blood pressure medicine for the last 2 days.  11/23/11- 26 yoF never smoker followed here for bronchiectasis, complicated by chronic vocal cord paresis, with hx MAIC- treated. Can tell more SOB and wheezing than usual; hoarseness as well Dr Dorthy Cooler gave Z-Pak and steroids in March for exacerbation of bronchitis.  Occasional night sweats and blood-streaked sputum, not progressive. She asks suggestions for management of muscle spasm. Taking Xanax. CXR 08/22/11 IMPRESSION:  No acute finding. Stable compared to prior examinations.  Original Report Authenticated By: Arvid Right. D'ALESSIO, M.D.    02/29/12- 62 yoF never smoker followed here for bronchiectasis, complicated by chronic vocal cord paresis, with hx MAIC- treated. SOB getting worse since last Thursday-started getting sick-contacted PCP office and gave Doxycycline and prednsione(completed) 1 week ago had sore throat with no fever. Primary physician started her on prednisone 40 mg daily x7 days, with doxycycline. She has a chronic history of persistent nausea and gas, starting before the doxycycline. She is about back to her respiratory baseline exertional dyspnea, little dry cough.  09/18/12- 82 yoF never  smoker followed here for bronchiectasis, complicated by chronic vocal cord paresis, with hx MAIC- treated. FOLLOWS FOR: gets more SOB than usual. Breathing has not gotten any better since last visit. She tells me reading is not really  different. Occasional coughing spells with no long-term change. Productive of white mucus with no blood. Several episodes of laryngitis this winter. Uses Spiriva and occasionally Atrovent. We discussed that overlap but she does not tolerate stimulants. We discussed her steroid Qvar. She is not walking much outside because of the weather and realizes she loses strength when she can't exercise. She has been told she has a hiatal hernia. We have talked about reflux and aspiration many times. She practices precautions as instructed.  03/21/13-82 yoF never smoker followed here for bronchiectasis, complicated by chronic vocal cord paresis, with hx MAIC- treated. FOLLOWS FOR:  Breathing is unchanged since last OV.  Would like to discuss medications Aware of reflux and we discussed the significance given her vocal cord paralysis. She continues to use Spiriva and Qvar 40. Treats chronic insomnia with Xanax 0.5 mg and amitriptyline 50 mg CXR 09/18/12 IMPRESSION:  No acute abnormality. Stable compared to prior exam.  Original Report Authenticated By: Orlean Patten, M.D.  10/17/13- 79 yoF never smoker followed here for bronchiectasis, complicated by chronic vocal cord paresis, with hx MAIC- treated. follows for:  breathing has been about the same since her last ov.  pt has been walking and wanted Dr. Annamaria Boots to be aware.  pt has cut way back on her pain meds.  Insomnia-sleeps well with amitriptyline +1/2 Xanax Daily productive cough with clear to dark sputum and rare blood streak. Continues Spiriva, Qvar and occasional Atrovent rescue inhaler  04/20/14-83 yoF never smoker followed here for bronchiectasis with hx MAIC- treated, intermittent hemoptysis,  complicated by chronic vocal cord paresis, Hashimoto's thyroiditis, GERD, Insomnia FOLLOW FOR:  Bronchiectasis; discuss surgery for paralyzed vocal chords, coughs up blood periodically     Daughter here               has had flu shot  Dymista nasal spray did help  bothersome postnasal drainage. She asks for allergy assessment. Taking Tussionex and Xanax to sleep through the night without cough. I expressed concern about oversedation at her age and discussed management. Cough productive white phlegm occasional tinge of blood. She feels her most bothersome complaint is her chronic hoarseness for which she has seen Dr. Janace Hoard ENT again. She is not interested in active intervention such as surgery. CXR 10/17/13 IMPRESSION:  Stable and chronic findings. No acute cardiopulmonary disease.  Electronically Signed  By: Margaree Mackintosh M.D.  On: 10/17/2013 15:35  10/22/14- 74 yoF never smoker followed here for bronchiectasis with hx MAIC- treated, intermittent hemoptysis,  complicated by chronic vocal cord paresis, Hashimoto's thyroiditis, GERD, Insomnia FOLLOWS FOR: Pt reports some change in breathing since last Ov- pt has increasd her exercise, building stamina.  Not walking outside on cooler days. Some increased cough and wheeze. Still occasionally coughs a streak of blood. This has gone on for years. Tussionex at night prevents cough that wakes her.  07/08/2015-82 year old female never smoker followed here for bronchiectasis with history MAIC-treated, intermittent hemoptysis, complicated by chronic vocal cord paresis, Hashimoto's thyroiditis, GERD, insomnia FOLLOWS FOR: Pt states she has coughed up blood about 2-3 times a month with a coughing spell. Pt has hiatal hernia(controlled)     , Hemoptysis or dyspnea with exertion. Rare use of Tussionex. No fever. CXR 10/22/2014 IMPRESSION: 1. No definite active process.  Emphysema. 2. Coarse markings at the lung bases reflect changes of bronchiectasis when compared to prior CT images. 3. Nodular opacity in the periphery of the left upper lung field. Recommend attention to this area on followup chest x-ray or CT of the chest. Electronically Signed  By: Ivar Drape M.D.  On: 10/22/2014  16:04  01/06/2016-82 year old female never smoker followed for bronchiectasis with history MAIC-treated, intermittent hemoptysis, complicated by chronic vocal cord paresis, Hashimoto's thyroiditis, GERD, insomnia FOLLOW FOR: gets out of breath when walking.  coughing up green/dark gray mucus. wheezing, squeaking sounds in chest.    For the past week has noted increased cough, increased dyspnea on exertion and more chest rattle. Denies fever. Sputum more green. CXR 07/08/2015 IMPRESSION: Chronic changes bilaterally stable from the prior exam. The nodular appearing density in the periphery of the left upper lobe is stable but shows a less nodular appearance. Continued followup is recommended. Electronically Signed  By: Inez Catalina M.D.  On: 07/08/2015 15:04  ROS-see HPI Constitutional:   No-   weight loss, +night sweats, fevers, chills, fatigue, lassitude. HEENT:   No-  headaches, difficulty swallowing, tooth/dental problems, sore throat, + chronic hoarseness      No-  sneezing, itching, ear ache, nasal congestion, post nasal drip,  CV:  No-   chest pain, orthopnea, PND, swelling in lower extremities, anasarca,  dizziness, palpitations Resp: +  shortness of breath with exertion or at rest.              + productive cough,  No non-productive cough,  + coughing up of blood.              No-   change in color of mucus.  No- wheezing.   Skin: No-   rash or lesions. GI:  + heartburn, indigestion, No-abdominal pain, nausea, No-vomiting,  GU: MS:  No-   joint pain or swelling. + Back pain   Neuro-     nothing unusual Psych:  No- change in mood or affect. No depression or anxiety.  No memory loss.  Objective:   Physical Exam General- Alert, Oriented, Affect-appropriate, Distress- none acute  Slender, talkative, Skin- rash-none, lesions- none, excoriation- none Lymphadenopathy- none Head- atraumatic            Eyes- Gross vision intact, PERRLA, conjunctivae clear secretions             Ears- Hearing, canals normal            Nose- Clear, No-Septal dev, mucus, polyps, erosion, perforation             Throat- Mallampati II , mucosa clear/ dry , drainage- none, tonsils- atrophy, +chronic hoarseness Neck- flexible , trachea midline, no stridor , thyroid nl, carotid no bruit Chest - symmetrical excursion , unlabored           Heart/CV- RRR now , no murmur , no gallop  , no rub, nl s1 s2                           - JVD- none , edema- none, stasis changes- none, varices- none           Lung- clear, wheeze- none, cough+light , dullness-none,  rub- none           Chest wall-  Abd-  Br/ Gen/ Rectal- Not done, not indicated Extrem- cyanosis- none, clubbing, none, atrophy- none, strength- nl Neuro- grossly intact to observation

## 2016-01-09 NOTE — Assessment & Plan Note (Signed)
Refill omeprazole. Continued emphasis on reflux precautions.

## 2016-01-09 NOTE — Assessment & Plan Note (Addendum)
Acute bronchitis exacerbation. No recent antibiotics so we will try Z-Pak. CXR

## 2016-01-09 NOTE — Assessment & Plan Note (Signed)
Chronic hoarseness and some increased risk of LPR. Over the years she has been pretty stable.

## 2016-01-10 ENCOUNTER — Telehealth: Payer: Self-pay | Admitting: Internal Medicine

## 2016-01-10 MED ORDER — AMOXICILLIN-POT CLAVULANATE 875-125 MG PO TABS: 1.0000 | ORAL_TABLET | Freq: Two times a day (BID) | ORAL | Status: DC

## 2016-01-10 NOTE — Telephone Encounter (Signed)
Offer augmentin 500 mg, # 20, 1 twice daily x 10 days

## 2016-01-10 NOTE — Telephone Encounter (Signed)
Spoke with pt, aware of recs.  rx sent to preferred pharmacy.  Nothing further needed.  

## 2016-01-10 NOTE — Telephone Encounter (Signed)
Called spoke with pt. She is aware of CXR results. Pt reports she has finished her ABX. She reports she is still having pain in her chest when she coughs, left side, prod cough (green phlem). She wants to know if she needs another ABX Please advise Dr. Annamaria Boots thanks  Allergies  Allergen Reactions  . Asa [Aspirin]     Spits up blood  . Boniva [Ibandronic Acid]     GI upset  . Cystex [Germanium]     ? GI upset  . Doxycycline Nausea Only and Hypertension     Current Outpatient Prescriptions on File Prior to Visit  Medication Sig Dispense Refill  . ALPRAZolam (XANAX) 0.5 MG tablet Take 0.5 mg by mouth at bedtime as needed. Take 1/2 by mouth three times a day as needed    . amitriptyline (ELAVIL) 50 MG tablet 1 tablet daily at 10 pm.    . amLODipine (NORVASC) 5 MG tablet Take 5 mg by mouth daily.      . Azelastine-Fluticasone (DYMISTA) 137-50 MCG/ACT SUSP Place 1-2 sprays into the nose daily. 23 g prn  . Azelastine-Fluticasone (DYMISTA) 137-50 MCG/ACT SUSP Place 1-2 puffs into the nose at bedtime. 1 Bottle prn  . azithromycin (ZITHROMAX) 250 MG tablet 2 today then one daily 6 tablet 0  . Calcium Carbonate-Vit D-Min (CALCIUM 1200 PO) Take 1 capsule by mouth every other day.     . celecoxib (CELEBREX) 200 MG capsule Take 200 mg by mouth 2 (two) times daily.    . chlorpheniramine-HYDROcodone (TUSSIONEX PENNKINETIC ER) 10-8 MG/5ML SUER Take 5 mLs by mouth every 12 (twelve) hours as needed for cough. 200 mL 0  . cholecalciferol (VITAMIN D) 1000 UNITS tablet Take 1,000 Units by mouth daily.    . Glucosamine-Chondroit-Vit C-Mn (GLUCOSAMINE CHONDR 1500 COMPLX) CAPS Take by mouth.    . levothyroxine (SYNTHROID, LEVOTHROID) 88 MCG tablet Take 88 mcg by mouth daily.      . Multiple Vitamin (MULTIVITAMIN) tablet Take 1 tablet by mouth 3 (three) times a week.     Marland Kitchen omeprazole (PRILOSEC) 20 MG capsule Take 1 capsule (20 mg total) by mouth daily. 90 capsule 3  . promethazine (PHENERGAN) 12.5 MG tablet  Take 12.5 mg by mouth every 6 (six) hours as needed.    . Psyllium (METAMUCIL) 30.9 % POWD Take by mouth.    Marland Kitchen QVAR 40 MCG/ACT inhaler USE 2 INHALATIONS TWICE A DAY. RINSE MOUTH 26.1 g 2  . Spacer/Aero-Holding Chambers (AEROCHAMBER MV) inhaler Use as instructed 1 each 0  . tiotropium (SPIRIVA HANDIHALER) 18 MCG inhalation capsule Place 1 capsule (18 mcg total) into inhaler and inhale daily. 90 capsule 3  . valACYclovir (VALTREX) 1000 MG tablet Take 2 now and 2 in 12 hours for cold sores.     . valsartan-hydrochlorothiazide (DIOVAN-HCT) 320-25 MG per tablet Take 1 tablet by mouth daily.       No current facility-administered medications on file prior to visit.

## 2016-01-14 ENCOUNTER — Telehealth: Payer: Self-pay | Admitting: Internal Medicine

## 2016-01-14 MED ORDER — FIRST-DUKES MOUTHWASH MT SUSP: 5.0000 mL | Freq: Three times a day (TID) | OROMUCOSAL | Status: DC

## 2016-01-14 NOTE — Telephone Encounter (Signed)
Patient states that she has been taking Augmentin and has developed sores in her mouth.  She said that she does not see any white spots, but her mouth is very sore.  Wants to know if she needs to stop the Augmentin?   Allergies  Allergen Reactions  . Asa [Aspirin]     Spits up blood  . Boniva [Ibandronic Acid]     GI upset  . Cystex [Germanium]     ? GI upset  . Doxycycline Nausea Only and Hypertension   Current Outpatient Prescriptions on File Prior to Visit  Medication Sig Dispense Refill  . ALPRAZolam (XANAX) 0.5 MG tablet Take 0.5 mg by mouth at bedtime as needed. Take 1/2 by mouth three times a day as needed    . amitriptyline (ELAVIL) 50 MG tablet 1 tablet daily at 10 pm.    . amLODipine (NORVASC) 5 MG tablet Take 5 mg by mouth daily.      Marland Kitchen amoxicillin-clavulanate (AUGMENTIN) 875-125 MG tablet Take 1 tablet by mouth 2 (two) times daily. 20 tablet 0  . Azelastine-Fluticasone (DYMISTA) 137-50 MCG/ACT SUSP Place 1-2 sprays into the nose daily. 23 g prn  . Azelastine-Fluticasone (DYMISTA) 137-50 MCG/ACT SUSP Place 1-2 puffs into the nose at bedtime. 1 Bottle prn  . azithromycin (ZITHROMAX) 250 MG tablet 2 today then one daily 6 tablet 0  . Calcium Carbonate-Vit D-Min (CALCIUM 1200 PO) Take 1 capsule by mouth every other day.     . celecoxib (CELEBREX) 200 MG capsule Take 200 mg by mouth 2 (two) times daily.    . chlorpheniramine-HYDROcodone (TUSSIONEX PENNKINETIC ER) 10-8 MG/5ML SUER Take 5 mLs by mouth every 12 (twelve) hours as needed for cough. 200 mL 0  . cholecalciferol (VITAMIN D) 1000 UNITS tablet Take 1,000 Units by mouth daily.    . Glucosamine-Chondroit-Vit C-Mn (GLUCOSAMINE CHONDR 1500 COMPLX) CAPS Take by mouth.    . levothyroxine (SYNTHROID, LEVOTHROID) 88 MCG tablet Take 88 mcg by mouth daily.      . Multiple Vitamin (MULTIVITAMIN) tablet Take 1 tablet by mouth 3 (three) times a week.     Marland Kitchen omeprazole (PRILOSEC) 20 MG capsule Take 1 capsule (20 mg total) by mouth daily.  90 capsule 3  . promethazine (PHENERGAN) 12.5 MG tablet Take 12.5 mg by mouth every 6 (six) hours as needed.    . Psyllium (METAMUCIL) 30.9 % POWD Take by mouth.    Marland Kitchen QVAR 40 MCG/ACT inhaler USE 2 INHALATIONS TWICE A DAY. RINSE MOUTH 26.1 g 2  . Spacer/Aero-Holding Chambers (AEROCHAMBER MV) inhaler Use as instructed 1 each 0  . tiotropium (SPIRIVA HANDIHALER) 18 MCG inhalation capsule Place 1 capsule (18 mcg total) into inhaler and inhale daily. 90 capsule 3  . valACYclovir (VALTREX) 1000 MG tablet Take 2 now and 2 in 12 hours for cold sores.     . valsartan-hydrochlorothiazide (DIOVAN-HCT) 320-25 MG per tablet Take 1 tablet by mouth daily.       No current facility-administered medications on file prior to visit.

## 2016-01-14 NOTE — Telephone Encounter (Signed)
Spoke with pt and gave recommendation. She will try mouthwash. Rx sent. Advised pt to contact office if not improving. Nothing further needed.

## 2016-01-14 NOTE — Telephone Encounter (Signed)
Offer Dukes magic mouthwash, # 120 ml     Swish and swallow 5 ml  Three times daily  Try to finish at least 7 days of augmentin if possible

## 2016-02-21 ENCOUNTER — Telehealth: Payer: Self-pay | Admitting: Internal Medicine

## 2016-02-21 MED ORDER — IPRATROPIUM BROMIDE HFA 17 MCG/ACT IN AERS: 2.0000 | INHALATION_SPRAY | Freq: Four times a day (QID) | RESPIRATORY_TRACT | 11 refills | Status: DC | PRN

## 2016-02-21 NOTE — Telephone Encounter (Signed)
Spoke with pt. She is requesting a refill on Atrovent HFA. States that CY gave this to her back in 2014. This medication is not listed on her medication list.  CY - please advise on refill. Thanks.

## 2016-02-21 NOTE — Telephone Encounter (Signed)
Ok Atrovent HFA, # 1,   Inhale 2 puffs every 6 hours as needed, rescue inhaler     Ref x 12

## 2016-02-21 NOTE — Telephone Encounter (Signed)
Spoke with pt. She is aware of CY's response. Rx has been sent in. Nothing further was needed. 

## 2016-03-10 ENCOUNTER — Telehealth: Payer: Self-pay | Admitting: Internal Medicine

## 2016-03-10 MED ORDER — HYDROCOD POLST-CPM POLST ER 10-8 MG/5ML PO SUER: 5.0000 mL | Freq: Two times a day (BID) | ORAL | 0 refills | Status: DC | PRN

## 2016-03-10 NOTE — Telephone Encounter (Signed)
Per SN---  Ok to refill the tussionex  4oz  1 tsp every 12 hours as needed for cough with no refills.  This has been printed out and I have called pt to make her aware.

## 2016-03-10 NOTE — Telephone Encounter (Signed)
Called and spoke with pt and she stated that she has only been using the cough meds at night but recently started with coughing spells during the day and has been using the cough meds once during the day too.  She feels that she may run out before Tuesday.  Requesting a refill of the tussionex.  Last rx given was 06/2015.  Last seen by CY on 01/06/16.  SN please advise if ok to refill.  Thanks  Allergies  Allergen Reactions  . Asa [Aspirin]     Spits up blood  . Boniva [Ibandronic Acid]     GI upset  . Cystex [Germanium]     ? GI upset  . Doxycycline Nausea Only and Hypertension

## 2016-03-28 DIAGNOSIS — Z23 Encounter for immunization: Secondary | ICD-10-CM | POA: Diagnosis not present

## 2016-05-23 DIAGNOSIS — J479 Bronchiectasis, uncomplicated: Secondary | ICD-10-CM | POA: Diagnosis not present

## 2016-05-23 DIAGNOSIS — I10 Essential (primary) hypertension: Secondary | ICD-10-CM | POA: Diagnosis not present

## 2016-05-23 DIAGNOSIS — J449 Chronic obstructive pulmonary disease, unspecified: Secondary | ICD-10-CM | POA: Diagnosis not present

## 2016-05-23 DIAGNOSIS — R399 Unspecified symptoms and signs involving the genitourinary system: Secondary | ICD-10-CM | POA: Diagnosis not present

## 2016-05-23 DIAGNOSIS — F419 Anxiety disorder, unspecified: Secondary | ICD-10-CM | POA: Diagnosis not present

## 2016-05-23 DIAGNOSIS — Z79899 Other long term (current) drug therapy: Secondary | ICD-10-CM | POA: Diagnosis not present

## 2016-05-23 DIAGNOSIS — M5136 Other intervertebral disc degeneration, lumbar region: Secondary | ICD-10-CM | POA: Diagnosis not present

## 2016-05-23 DIAGNOSIS — E441 Mild protein-calorie malnutrition: Secondary | ICD-10-CM | POA: Diagnosis not present

## 2016-05-23 DIAGNOSIS — E039 Hypothyroidism, unspecified: Secondary | ICD-10-CM | POA: Diagnosis not present

## 2016-06-23 ENCOUNTER — Telehealth (INDEPENDENT_AMBULATORY_CARE_PROVIDER_SITE_OTHER): Payer: Self-pay | Admitting: *Deleted

## 2016-06-23 MED ORDER — CELECOXIB 200 MG PO CAPS: 200.0000 mg | ORAL_CAPSULE | Freq: Two times a day (BID) | ORAL | 0 refills | Status: DC

## 2016-06-23 NOTE — Telephone Encounter (Signed)
Please advise. Requesting refill of her Celebrex.

## 2016-06-23 NOTE — Telephone Encounter (Signed)
OK refill thanks 

## 2016-06-23 NOTE — Telephone Encounter (Signed)
[facility] calling about celebrex generic for pt.

## 2016-06-23 NOTE — Telephone Encounter (Signed)
Refill sent to pharm

## 2016-07-19 ENCOUNTER — Telehealth (INDEPENDENT_AMBULATORY_CARE_PROVIDER_SITE_OTHER): Payer: Self-pay | Admitting: Radiology

## 2016-07-19 NOTE — Telephone Encounter (Signed)
[name] with [facility] called and states Celexicob is a nonpreferred drug. They would like to know if you would like to change it to a preferred med such as ibuprofen, naproxen, meloxicam, or etodolac.  Use reference number [id number] when calling back in.

## 2016-07-20 ENCOUNTER — Telehealth (INDEPENDENT_AMBULATORY_CARE_PROVIDER_SITE_OTHER): Payer: Self-pay | Admitting: Orthopaedic Surgery

## 2016-07-20 NOTE — Telephone Encounter (Signed)
I left voicemail for patient to call and let me know what she would like to do.

## 2016-07-20 NOTE — Telephone Encounter (Signed)
[name] with [facility]  called needing clarification on Rx (celecoxib 200 mg capsules)  The ph# is [phone]   The fax# is [phone]   Ref# [id number]

## 2016-07-20 NOTE — Telephone Encounter (Signed)
Call pt and ask, do what she would like

## 2016-07-24 NOTE — Telephone Encounter (Signed)
Spoke with [facility]. They sent all info over for refill for med, but then called and state it is not preferred and would like to change it. I am waiting on call from patient to decide what she would like to try. In the mean time, pharmacy is going to send me a prior auth form to be working on if we decide to stay with Celebrex.

## 2016-08-02 ENCOUNTER — Telehealth: Payer: Self-pay | Admitting: Internal Medicine

## 2016-08-02 MED ORDER — AZELASTINE-FLUTICASONE 137-50 MCG/ACT NA SUSP: 1.0000 | Freq: Every day | NASAL | 3 refills | Status: DC

## 2016-08-02 NOTE — Telephone Encounter (Signed)
Patient is requesting a script for celebrex [facility]   Cb#: [phone]

## 2016-08-02 NOTE — Telephone Encounter (Signed)
Spoke with pt, requesting refill on alstelin.  [facility] has been [facility] to [facility].  Nothing further needed.

## 2016-08-11 ENCOUNTER — Telehealth (INDEPENDENT_AMBULATORY_CARE_PROVIDER_SITE_OTHER): Payer: Self-pay | Admitting: Radiology

## 2016-08-11 NOTE — Telephone Encounter (Signed)
I called patient and spoke with her about [facility] wanting her to try a Tier 1 medication due to generic Celebrex being non preferred. She states she cannot take any of the preferred meds due to coughing up blood because of CHF with them. I  I spoke with Leafy Ro at [facility] and got authorization for Celebrex. Patient is approved for one year, until Feb. 1, 2019.  They will send med out. I called patient and advised.

## 2016-10-16 ENCOUNTER — Other Ambulatory Visit: Payer: Self-pay | Admitting: Internal Medicine

## 2016-11-09 ENCOUNTER — Telehealth: Payer: Self-pay | Admitting: Internal Medicine

## 2016-11-09 MED ORDER — HYDROCOD POLST-CPM POLST ER 10-8 MG/5ML PO SUER: 5.0000 mL | Freq: Two times a day (BID) | ORAL | 0 refills | Status: DC | PRN

## 2016-11-09 NOTE — Telephone Encounter (Signed)
Pt c/o "coughing spasms" qhs- states this has been going on for several days.  Denies any mucus production.  Requesting refill on tussionex cough syrup.   Pt also wants to know if she can take regular mucinex during the day to help with cough.  Last refill:   03/10/2016 #163mL with 0 refills- take 76mp q12h prn cough.  CY please advise on refill.  Thanks!

## 2016-11-09 NOTE — Telephone Encounter (Signed)
Ok to refill Tussionex  Ok also to take Mucinex or Mucinex-DM

## 2016-11-09 NOTE — Telephone Encounter (Signed)
Called and spoke to pt. Informed her of the recs per CY. Rx printed and placed on CY's cart to be signed. Will keep message in triage to follow. Pt requesting to pick up rx tomorrow.

## 2016-11-09 NOTE — Telephone Encounter (Signed)
Rx has been placed up front for pick up. Pt already aware. Nothing further needed.

## 2016-11-27 DIAGNOSIS — L821 Other seborrheic keratosis: Secondary | ICD-10-CM | POA: Diagnosis not present

## 2016-11-27 DIAGNOSIS — Z1283 Encounter for screening for malignant neoplasm of skin: Secondary | ICD-10-CM | POA: Diagnosis not present

## 2016-11-27 DIAGNOSIS — L570 Actinic keratosis: Secondary | ICD-10-CM | POA: Diagnosis not present

## 2016-11-27 DIAGNOSIS — X32XXXD Exposure to sunlight, subsequent encounter: Secondary | ICD-10-CM | POA: Diagnosis not present

## 2016-12-08 ENCOUNTER — Other Ambulatory Visit: Payer: Self-pay | Admitting: Family Medicine

## 2016-12-08 ENCOUNTER — Ambulatory Visit
Admission: RE | Admit: 2016-12-08 | Discharge: 2016-12-08 | Disposition: A | Discharge status: Home / Self Care | Payer: Medicare Other | Source: Ambulatory Visit | Attending: Family Medicine | Admitting: Family Medicine

## 2016-12-08 DIAGNOSIS — J449 Chronic obstructive pulmonary disease, unspecified: Secondary | ICD-10-CM | POA: Diagnosis not present

## 2016-12-08 DIAGNOSIS — J479 Bronchiectasis, uncomplicated: Secondary | ICD-10-CM

## 2016-12-08 DIAGNOSIS — I10 Essential (primary) hypertension: Secondary | ICD-10-CM | POA: Diagnosis not present

## 2016-12-08 DIAGNOSIS — R609 Edema, unspecified: Secondary | ICD-10-CM | POA: Diagnosis not present

## 2016-12-25 DIAGNOSIS — R609 Edema, unspecified: Secondary | ICD-10-CM | POA: Diagnosis not present

## 2017-02-01 ENCOUNTER — Telehealth: Payer: Self-pay | Admitting: Internal Medicine

## 2017-02-01 DIAGNOSIS — J479 Bronchiectasis, uncomplicated: Secondary | ICD-10-CM

## 2017-02-01 MED ORDER — OMEPRAZOLE 20 MG PO CPDR: 20.0000 mg | DELAYED_RELEASE_CAPSULE | Freq: Every day | ORAL | 4 refills | Status: DC

## 2017-02-01 NOTE — Telephone Encounter (Signed)
Called spoke with patient, advised that CY okayed the refill on her Omeprazole x1 year and she needs an appt.  Pt okay with this and voiced her understanding.  Refills sent Appt scheduled 1st available with CY for 10.23.18 @ 1100 Appt card mailed to patient  Nothing further needed; will sign off

## 2017-02-01 NOTE — Telephone Encounter (Signed)
CY  Please Advise-  Pt called in requesting if we could send a refill of her omeprazole for a [facility] day supply [facility]. Pt was last seen 01/06/16. Pt wanted to make an appt a follow up appt, your first available is in Oct 2018. Please advise if ok to refill and inform us if your first available appt ok or would you like to see the pt sooner  Current Outpatient Prescriptions on File Prior to Visit  Medication Sig Dispense Refill  . ALPRAZolam (XANAX) 0.5 MG tablet Take 0.5 mg by mouth at bedtime as needed. Take 1/2 by mouth three times a day as needed    . amitriptyline (ELAVIL) 50 MG tablet 1 tablet daily at 10 pm.    . amLODipine (NORVASC) 5 MG tablet Take 5 mg by mouth daily.      Marland Kitchen amoxicillin-clavulanate (AUGMENTIN) 875-125 MG tablet Take 1 tablet by mouth 2 (two) times daily. 20 tablet 0  . Azelastine-Fluticasone (DYMISTA) 137-50 MCG/ACT SUSP Place 1-2 puffs into the nose at bedtime. 1 Bottle prn  . Azelastine-Fluticasone (DYMISTA) 137-50 MCG/ACT SUSP Place 1-2 sprays into the nose daily. 69 g 3  . azithromycin (ZITHROMAX) 250 MG tablet 2 today then one daily 6 tablet 0  . Calcium Carbonate-Vit D-Min (CALCIUM 1200 PO) Take 1 capsule by mouth every other day.     . celecoxib (CELEBREX) 200 MG capsule Take 1 capsule (200 mg total) by mouth 2 (two) times daily. 60 capsule 0  . chlorpheniramine-HYDROcodone (TUSSIONEX PENNKINETIC ER) 10-8 MG/5ML SUER Take 5 mLs by mouth every 12 (twelve) hours as needed for cough. 120 mL 0  . cholecalciferol (VITAMIN D) 1000 UNITS tablet Take 1,000 Units by mouth daily.    . Diphenhyd-Hydrocort-Nystatin (FIRST-DUKES MOUTHWASH) SUSP Use as directed 5 mLs in the mouth or throat 3 (three) times daily. SWISH AND SWALLOW 120 mL 0  . Glucosamine-Chondroit-Vit C-Mn (GLUCOSAMINE CHONDR 1500 COMPLX) CAPS Take by mouth.    Marland Kitchen ipratropium (ATROVENT HFA) 17 MCG/ACT inhaler Inhale 2 puffs into the lungs every 6 (six) hours as needed for wheezing. 1 Inhaler 11  .  levothyroxine (SYNTHROID, LEVOTHROID) 88 MCG tablet Take 88 mcg by mouth daily.      . Multiple Vitamin (MULTIVITAMIN) tablet Take 1 tablet by mouth 3 (three) times a week.     Marland Kitchen omeprazole (PRILOSEC) 20 MG capsule Take 1 capsule (20 mg total) by mouth daily. 90 capsule 3  . promethazine (PHENERGAN) 12.5 MG tablet Take 12.5 mg by mouth every 6 (six) hours as needed.    . Psyllium (METAMUCIL) 30.9 % POWD Take by mouth.    Marland Kitchen QVAR 40 MCG/ACT inhaler USE 2 INHALATIONS TWICE A DAY. RINSE MOUTH 26.1 g 2  . Spacer/Aero-Holding Chambers (AEROCHAMBER MV) inhaler Use as instructed 1 each 0  . SPIRIVA HANDIHALER 18 MCG inhalation capsule INHALE THE CONTENTS OF 1 CAPSULE DAILY 90 capsule 1  . valACYclovir (VALTREX) 1000 MG tablet Take 2 now and 2 in 12 hours for cold sores.     . valsartan-hydrochlorothiazide (DIOVAN-HCT) 320-25 MG per tablet Take 1 tablet by mouth daily.       No current facility-administered medications on file prior to visit.

## 2017-02-01 NOTE — Telephone Encounter (Signed)
[place] for 90 day omeprazole, refill x 1 year  Ok next available routine ROV with me

## 2017-02-23 DIAGNOSIS — Z0001 Encounter for general adult medical examination with abnormal findings: Secondary | ICD-10-CM | POA: Diagnosis not present

## 2017-02-23 DIAGNOSIS — E441 Mild protein-calorie malnutrition: Secondary | ICD-10-CM | POA: Diagnosis not present

## 2017-02-23 DIAGNOSIS — E039 Hypothyroidism, unspecified: Secondary | ICD-10-CM | POA: Diagnosis not present

## 2017-02-23 DIAGNOSIS — Z23 Encounter for immunization: Secondary | ICD-10-CM | POA: Diagnosis not present

## 2017-02-23 DIAGNOSIS — J479 Bronchiectasis, uncomplicated: Secondary | ICD-10-CM | POA: Diagnosis not present

## 2017-02-23 DIAGNOSIS — Z79899 Other long term (current) drug therapy: Secondary | ICD-10-CM | POA: Diagnosis not present

## 2017-02-23 DIAGNOSIS — M5136 Other intervertebral disc degeneration, lumbar region: Secondary | ICD-10-CM | POA: Diagnosis not present

## 2017-02-23 DIAGNOSIS — M810 Age-related osteoporosis without current pathological fracture: Secondary | ICD-10-CM | POA: Diagnosis not present

## 2017-02-23 DIAGNOSIS — E7800 Pure hypercholesterolemia, unspecified: Secondary | ICD-10-CM | POA: Diagnosis not present

## 2017-02-23 DIAGNOSIS — F419 Anxiety disorder, unspecified: Secondary | ICD-10-CM | POA: Diagnosis not present

## 2017-02-23 DIAGNOSIS — I10 Essential (primary) hypertension: Secondary | ICD-10-CM | POA: Diagnosis not present

## 2017-02-28 ENCOUNTER — Emergency Department (HOSPITAL_COMMUNITY)
Admission: EM | Admit: 2017-02-28 | Discharge: 2017-02-28 | Disposition: A | Discharge status: Home / Self Care | Payer: Medicare Other | Attending: Emergency Medicine | Admitting: Emergency Medicine

## 2017-02-28 ENCOUNTER — Emergency Department (HOSPITAL_COMMUNITY): Payer: Medicare Other

## 2017-02-28 DIAGNOSIS — Y939 Activity, unspecified: Secondary | ICD-10-CM | POA: Diagnosis not present

## 2017-02-28 DIAGNOSIS — Z79899 Other long term (current) drug therapy: Secondary | ICD-10-CM | POA: Insufficient documentation

## 2017-02-28 DIAGNOSIS — Y92009 Unspecified place in unspecified non-institutional (private) residence as the place of occurrence of the external cause: Secondary | ICD-10-CM | POA: Insufficient documentation

## 2017-02-28 DIAGNOSIS — W1830XA Fall on same level, unspecified, initial encounter: Secondary | ICD-10-CM | POA: Insufficient documentation

## 2017-02-28 DIAGNOSIS — R05 Cough: Secondary | ICD-10-CM | POA: Insufficient documentation

## 2017-02-28 DIAGNOSIS — Y999 Unspecified external cause status: Secondary | ICD-10-CM | POA: Diagnosis not present

## 2017-02-28 DIAGNOSIS — R053 Chronic cough: Secondary | ICD-10-CM

## 2017-02-28 DIAGNOSIS — M25551 Pain in right hip: Secondary | ICD-10-CM | POA: Diagnosis not present

## 2017-02-28 DIAGNOSIS — S300XXA Contusion of lower back and pelvis, initial encounter: Secondary | ICD-10-CM | POA: Diagnosis not present

## 2017-02-28 DIAGNOSIS — M545 Low back pain: Secondary | ICD-10-CM | POA: Diagnosis not present

## 2017-02-28 DIAGNOSIS — S79911A Unspecified injury of right hip, initial encounter: Secondary | ICD-10-CM | POA: Diagnosis not present

## 2017-02-28 DIAGNOSIS — M533 Sacrococcygeal disorders, not elsewhere classified: Secondary | ICD-10-CM | POA: Diagnosis not present

## 2017-02-28 DIAGNOSIS — S3992XA Unspecified injury of lower back, initial encounter: Secondary | ICD-10-CM | POA: Diagnosis not present

## 2017-02-28 LAB — BASIC METABOLIC PANEL
Anion gap: 8 (ref 5–15)
BUN: 9 mg/dL (ref 6–20)
CHLORIDE: 95 mmol/L — AB (ref 101–111)
CO2: 31 mmol/L (ref 22–32)
CREATININE: 0.62 mg/dL (ref 0.44–1.00)
Calcium: 9.3 mg/dL (ref 8.9–10.3)
GFR calc Af Amer: >60 mL/min (ref >60)
GFR calc non Af Amer: >60 mL/min (ref >60)
GLUCOSE: 95 mg/dL (ref 65–99)
POTASSIUM: 3.5 mmol/L (ref 3.5–5.1)
Sodium: 134 mmol/L — ABNORMAL LOW (ref 135–145)

## 2017-02-28 LAB — CBC WITH DIFFERENTIAL/PLATELET
Basophils Absolute: 0 10*3/uL (ref 0.0–0.1)
Basophils Relative: 0 %
EOS ABS: 0.2 10*3/uL (ref 0.0–0.7)
EOS PCT: 2 %
HCT: 38.8 % (ref 36.0–46.0)
Hemoglobin: 13.3 g/dL (ref 12.0–15.0)
LYMPHS ABS: 1.7 10*3/uL (ref 0.7–4.0)
Lymphocytes Relative: 17 %
MCH: 28.9 pg (ref 26.0–34.0)
MCHC: 34.3 g/dL (ref 30.0–36.0)
MCV: 84.3 fL (ref 78.0–100.0)
MONOS PCT: 10 %
Monocytes Absolute: 1 10*3/uL (ref 0.1–1.0)
Neutro Abs: 6.9 10*3/uL (ref 1.7–7.7)
Neutrophils Relative %: 71 %
Platelets: 250 10*3/uL (ref 150–400)
RBC: 4.6 MIL/uL (ref 3.87–5.11)
RDW: 14 % (ref 11.5–15.5)
WBC: 9.9 10*3/uL (ref 4.0–10.5)

## 2017-02-28 MED ORDER — BENZONATATE 100 MG PO CAPS: 100.0000 mg | ORAL_CAPSULE | Freq: Three times a day (TID) | ORAL | 0 refills | Status: DC

## 2017-02-28 MED ORDER — ACETAMINOPHEN-CODEINE #3 300-30 MG PO TABS
2.0000 | ORAL_TABLET | Freq: Once | ORAL | Status: AC
  Administered 2017-02-28: 2 via ORAL
  Filled 2017-02-28: qty 2

## 2017-02-28 MED ORDER — ACETAMINOPHEN-CODEINE #3 300-30 MG PO TABS
2.0000 | ORAL_TABLET | Freq: Four times a day (QID) | ORAL | 0 refills | Status: DC | PRN
Start: 2017-02-28 — End: 2017-06-29

## 2017-02-28 MED ORDER — HYDROCODONE-ACETAMINOPHEN 5-325 MG PO TABS: 1.0000 | ORAL_TABLET | Freq: Two times a day (BID) | ORAL | 0 refills | Status: DC | PRN

## 2017-02-28 NOTE — ED Provider Notes (Signed)
[facility] DEPT Provider Note   CSN: [id number] Arrival date & time: 02/28/17  1439     History   Chief Complaint No chief complaint on file.   HPI [name] is a 83 y.o. female.  HPI Pt comes in to the ER with cc of fall. Pt has hx of COPD. She had a fall 2 nights ago due to loss of balance. Pt fell onto her tail bone and is complaining of pain to the lower back and her hip. She has been able to ambulate, but with pain and her pain is worse when she cough due to her lung condition. Pt's pain wasn't getting better, so she came to the ER. Pt didn't strike her head when she fell and has no headaches, neck pain, Pt has no  nausea, vomiting, seizures, loss of consciousness or new visual complains, weakness, numbness, dizziness or gait instability.   Past Medical History:  Diagnosis Date  . Bronchiectasis   . Hashimoto's thyroiditis   . Vocal cord paralysis     Patient Active Problem List   Diagnosis Date Noted  . Rhinitis, chronic 04/20/2014  . GERD (gastroesophageal reflux disease) 03/30/2013  . Protein calorie malnutrition ([place]) 11/28/2011  . HASHIMOTO'S THYROIDITIS 09/10/2007  . Vocal cord paresis 09/10/2007  . Pneumonia due to other specified bacteria ([place]) 09/10/2007  . BRONCHIECTASIS 09/10/2007    Past Surgical History:  Procedure Laterality Date  . APPENDECTOMY    . HEMORRHOID SURGERY    . hysteroscopy x 2    . THYROIDECTOMY    . TUBAL LIGATION      OB History    No data available       Home Medications    Prior to Admission medications   Medication Sig Start Date End Date Taking? Authorizing Provider  ALPRAZolam Duanne Moron) 0.5 MG tablet Take 0.25 mg by mouth at bedtime.    Yes [provider]  ALPRAZolam (XANAX) 0.5 MG tablet Take 0.25-0.5 mg by mouth 2 (two) times daily as needed for anxiety.   Yes [provider]  amitriptyline (ELAVIL) 50 MG tablet Take 50 mg by mouth at bedtime.    Yes [provider]    Azelastine-Fluticasone (DYMISTA) 137-50 MCG/ACT SUSP Place 1-2 sprays into the nose daily. Patient taking differently: Place 1-2 sprays into the nose daily as needed (for allergies).  08/02/16  Yes Young, Tarri Fuller D, MD  chlorpheniramine-HYDROcodone (TUSSIONEX PENNKINETIC ER) 10-8 MG/5ML SUER Take 5 mLs by mouth every 12 (twelve) hours as needed for cough. Patient taking differently: Take 1.25 mLs by mouth every 12 (twelve) hours as needed for cough.  11/09/16  Yes Young, Tarri Fuller D, MD  cholecalciferol (VITAMIN D) 1000 UNITS tablet Take 1,000 Units by mouth daily.   Yes [provider]  Docusate Calcium (STOOL SOFTENER PO) Take 1 capsule by mouth at bedtime.   Yes [provider]  furosemide (LASIX) 20 MG tablet Take 20 mg by mouth daily as needed (for swelling).   Yes [provider]  guaiFENesin (MUCINEX) 600 MG 12 hr tablet Take 600 mg by mouth 2 (two) times daily as needed for cough or to loosen phlegm.   Yes [provider]  levothyroxine (SYNTHROID, LEVOTHROID) 88 MCG tablet Take 88 mcg by mouth daily before breakfast.    Yes [provider]  Multiple Vitamin (MULTIVITAMIN) tablet Take 1 tablet by mouth 3 (three) times a week.    Yes [provider]  Psyllium (METAMUCIL) 30.9 % POWD Take 15 mLs  by mouth at bedtime.    Yes [provider]  QVAR 40 MCG/ACT inhaler USE 2 INHALATIONS TWICE A DAY. RINSE MOUTH 10/01/15  Yes Deneise Lever, MD  Spacer/Aero-Holding Chambers (AEROCHAMBER MV) inhaler Use as instructed 04/02/13  Yes Baird Lyons D, MD  SPIRIVA HANDIHALER 18 MCG inhalation capsule INHALE THE CONTENTS OF 1 CAPSULE DAILY 10/16/16  Yes Young, Tarri Fuller D, MD  Throat Lozenges (COUGH DROPS MT) Use as directed 1 each in the mouth or throat as needed (for cough).    Yes [provider]  valsartan-hydrochlorothiazide (DIOVAN-HCT) 320-25 MG per tablet Take 1 tablet by mouth daily.     Yes [provider]   acetaminophen-codeine (TYLENOL #3) 300-30 MG tablet Take 2 tablets by mouth every 6 (six) hours as needed for moderate pain. 02/28/17   Varney Biles, MD  amLODipine (NORVASC) 5 MG tablet Take 5 mg by mouth daily.      [provider]  Azelastine-Fluticasone (DYMISTA) 137-50 MCG/ACT SUSP Place 1-2 puffs into the nose at bedtime. Patient not taking: Reported on 02/28/2017 01/06/16 01/05/17  Deneise Lever, MD  benzonatate (TESSALON) 100 MG capsule Take 1 capsule (100 mg total) by mouth every 8 (eight) hours. 02/28/17   Varney Biles, MD  Calcium Carbonate-Vit D-Min (CALCIUM 1200 PO) Take 1 capsule by mouth every other day.     [provider]  celecoxib (CELEBREX) 200 MG capsule Take 1 capsule (200 mg total) by mouth 2 (two) times daily. 06/23/16   Marybelle Killings, MD  Diphenhyd-Hydrocort-Nystatin (FIRST-DUKES MOUTHWASH) SUSP Use as directed 5 mLs in the mouth or throat 3 (three) times daily. SWISH AND SWALLOW 01/14/16   Young, Kasandra Knudsen, MD  Glucosamine-Chondroit-Vit C-Mn (GLUCOSAMINE CHONDR 1500 COMPLX) CAPS Take by mouth.    [provider]  HYDROcodone-acetaminophen (NORCO/VICODIN) 5-325 MG tablet Take 1 tablet by mouth every 12 (twelve) hours as needed for severe pain. 02/28/17   Varney Biles, MD  ipratropium (ATROVENT HFA) 17 MCG/ACT inhaler Inhale 2 puffs into the lungs every 6 (six) hours as needed for wheezing. 02/21/16   Deneise Lever, MD  omeprazole (PRILOSEC) 20 MG capsule Take 1 capsule (20 mg total) by mouth daily. 02/01/17   Deneise Lever, MD  promethazine (PHENERGAN) 12.5 MG tablet Take 12.5 mg by mouth every 6 (six) hours as needed.    [provider]  valACYclovir (VALTREX) 1000 MG tablet Take 2 now and 2 in 12 hours for cold sores.     [provider]    Family History Family History  Problem Relation Age of Onset  . COPD Father   . Hypertension Father     Social History Social History  Substance Use Topics  . Smoking  status: Never Smoker  . Smokeless tobacco: Not on file  . Alcohol use Not on file     Allergies   Asa [aspirin]; Boniva [ibandronic acid]; Cystex [germanium]; and Doxycycline   Review of Systems Review of Systems  Constitutional: Positive for activity change.  Respiratory: Negative for shortness of breath.   Cardiovascular: Negative for chest pain.  Gastrointestinal: Negative for abdominal pain and vomiting.  Musculoskeletal: Positive for back pain and gait problem. Negative for neck pain.  Neurological: Negative for headaches.  Hematological: Does not bruise/bleed easily.     Physical Exam Updated Vital Signs BP 134/87 (BP Location: Right Arm)   Pulse 77   Temp 98.5 F (36.9 C) (Oral)   Resp 18   SpO2 99%   Physical  Exam  Constitutional: She is oriented to person, place, and time. She appears well-developed.  HENT:  Head: Normocephalic and atraumatic.  Eyes: EOM are normal.  Neck: Normal range of motion. Neck supple.  No midline c-spine tenderness, pt able to turn head to 45 degrees bilaterally without any pain and able to flex neck to the chest and extend without any pain or neurologic symptoms.   Cardiovascular: Normal rate.   Pulmonary/Chest: Effort normal.  Abdominal: Bowel sounds are normal.  Musculoskeletal:  R sided pelvic tenderness and lower lumbar and sacral spine tenderness. Otherwise:  Head to toe evaluation shows no hematoma, bleeding of the scalp, no facial abrasions, no spine step offs, crepitus of the chest or neck, no tenderness to palpation of the bilateral upper and lower extremities, no gross deformities, no chest tenderness, no pelvic pain.   Neurological: She is alert and oriented to person, place, and time.  Skin: Skin is warm and dry.  Nursing note and vitals reviewed.    ED Treatments / Results  Labs (all labs ordered are listed, but only abnormal results are displayed) Labs Reviewed  BASIC METABOLIC PANEL - Abnormal; Notable for the  following:       Result Value   Sodium 134 (*)    Chloride 95 (*)    All other components within normal limits  CBC WITH DIFFERENTIAL/PLATELET    EKG  EKG Interpretation None       Radiology Dg Lumbar Spine Complete  Result Date: 02/28/2017 CLINICAL DATA:  Right hip and low back pain secondary to a fall 2 days ago. EXAM: LUMBAR SPINE - COMPLETE 4+ VIEW COMPARISON:  CT scan abdomen and pelvis dated 03/27/2012 FINDINGS: There is no evidence of lumbar spine fracture. There is grade 1 spondylolisthesis at L4-5 without disc space narrowing. There is moderate facet arthritis at L3-4, L4-5, and L5-S1. There is degenerative disc disease at T12-L1 and L1-2. IMPRESSION: No acute abnormality. Multilevel degenerative facet arthritis in the lower lumbar spine with grade 1 spondylolisthesis at L4-5. Electronically Signed   By: Lorriane Shire M.D.   On: 02/28/2017 16:43   Dg Sacrum/coccyx  Result Date: 02/28/2017 CLINICAL DATA:  Fall.  Pain.  Sciatica. EXAM: SACRUM AND COCCYX - 2+ VIEW COMPARISON:  CT 03/27/2012. FINDINGS: Degenerative changes scratched it diffuse osteopenia. Degenerative changes lumbar spine and both hips. No definite displaced fractures identified. Normal sacral alignment. IMPRESSION: Diffuse osteopenia and degenerative change. No definite sacral fracture identified. Electronically Signed   By: Marcello Moores  Register   On: 02/28/2017 16:41   Dg Hip Unilat W Or Wo Pelvis 2-3 Views Right  Result Date: 02/28/2017 CLINICAL DATA:  Fall.  Pain .  Sciatica. EXAM: DG HIP (WITH OR WITHOUT PELVIS) 2-3V RIGHT COMPARISON:  CT 03/27/2012. FINDINGS: Diffuse osteopenia. Degenerative changes lumbar spine and both hips. No acute bony abnormalities identified. No evidence of fracture dislocation. Large amount of stool noted throughout the colon suggesting constipation. Peripheral vascular calcification. IMPRESSION: Diffuse osteopenia. Diffuse degenerative change lumbar spine and both hips. No acute bony  abnormality identified. Electronically Signed   By: Marcello Moores  Register   On: 02/28/2017 16:44    Procedures Procedures (including critical care time)  Medications Ordered in ED Medications  acetaminophen-codeine (TYLENOL #3) 300-30 MG per tablet 2 tablet (2 tablets Oral Given 02/28/17 1634)     Initial Impression / Assessment and Plan / ED Course  I have reviewed the triage vital signs and the nursing notes.  Pertinent labs & imaging results that were available  during my care of the patient were reviewed by me and considered in my medical decision making (see chart for details).  Clinical Course as of Feb 28 1826  Wed Feb 28, 2017  1826 Pt ambulated in my presence with her cain. Results from the ER workup discussed with the patient face to face and all questions answered to the best of my ability. Strict ER return precautions have been discussed, and patient is agreeing with the plan and is comfortable with the workup done and the recommendations from the ER. Daughter to stay with the patient.  [AN]    Clinical Course User Index [AN] Varney Biles, MD    DDx includes: - Mechanical falls - ICH - Fractures - Contusions - Soft tissue injury  Fall was 2 nights ago. No redflags for elevated ICP. Pt has no headaches, not on blood thinners. We will clear the brain clinically. C spine also cleared clinically (both with [place] rules). Red flags for brain bleed discussed, and pt is fine going home w/o any advanced imaging. Pt has tenderness over the back and hip region. Xrays ordered   Final Clinical Impressions(s) / ED Diagnoses   Final diagnoses:  Contusion of lower back, initial encounter  Chronic cough    New Prescriptions Discharge Medication List as of 02/28/2017  5:28 PM    START taking these medications   Details  acetaminophen-codeine (TYLENOL #3) 300-30 MG tablet Take 2 tablets by mouth every 6 (six) hours as needed for moderate pain., Starting Wed 02/28/2017, Print      benzonatate (TESSALON) 100 MG capsule Take 1 capsule (100 mg total) by mouth every 8 (eight) hours., Starting Wed 02/28/2017, Print    HYDROcodone-acetaminophen (NORCO/VICODIN) 5-325 MG tablet Take 1 tablet by mouth every 12 (twelve) hours as needed for severe pain., Starting Wed 02/28/2017, Print         Varney Biles, MD 02/28/17 [phone]

## 2017-02-28 NOTE — Discharge Instructions (Signed)
We saw you in the ER after you had a fall. All the imaging results are normal, no fractures seen. No evidence of brain bleed. Please be very careful with walking, and do everything possible to prevent falls.  Take the strong pain medicine only if the pain is excruciating.

## 2017-02-28 NOTE — ED Triage Notes (Signed)
Patient reports that she took some cough medicine and when she got up at 0230, 3 days ago she was lightheaded and fell. Patient states she fell on her buttocks. Patient c/o coccyx and right buttock pain. Patient states she has increased pain when she bends over, sits, and walks.

## 2017-02-28 NOTE — ED Notes (Signed)
Pt. Ambulate with no assist. Pt. Walked to the bathroom from bed with a walker. Pt. Stated no dizziness nor being light headed. Nurse aware.

## 2017-03-27 DIAGNOSIS — Z23 Encounter for immunization: Secondary | ICD-10-CM | POA: Diagnosis not present

## 2017-03-27 DIAGNOSIS — I10 Essential (primary) hypertension: Secondary | ICD-10-CM | POA: Diagnosis not present

## 2017-03-27 DIAGNOSIS — M533 Sacrococcygeal disorders, not elsewhere classified: Secondary | ICD-10-CM | POA: Diagnosis not present

## 2017-03-27 DIAGNOSIS — R5381 Other malaise: Secondary | ICD-10-CM | POA: Diagnosis not present

## 2017-03-29 ENCOUNTER — Telehealth: Payer: Self-pay | Admitting: Internal Medicine

## 2017-03-29 MED ORDER — HYDROCOD POLST-CPM POLST ER 10-8 MG/5ML PO SUER: 5.0000 mL | Freq: Two times a day (BID) | ORAL | 0 refills | Status: DC | PRN

## 2017-03-29 NOTE — Telephone Encounter (Signed)
Ok to refill as requested 

## 2017-03-29 NOTE — Telephone Encounter (Signed)
Pt states she is out of her Tussionex cough syrup and needs refill. CY Please advise on refill. Thanks.    Pt wanted CY that she only uses cough syrup occasionally and Rx usually lasts her for a while.

## 2017-03-29 NOTE — Telephone Encounter (Signed)
Spoke with pt. She is aware that her prescription is ready for pick up. Nothing further was needed. 

## 2017-04-02 DIAGNOSIS — M1990 Unspecified osteoarthritis, unspecified site: Secondary | ICD-10-CM | POA: Diagnosis not present

## 2017-04-02 DIAGNOSIS — K219 Gastro-esophageal reflux disease without esophagitis: Secondary | ICD-10-CM | POA: Diagnosis not present

## 2017-04-02 DIAGNOSIS — Z9181 History of falling: Secondary | ICD-10-CM | POA: Diagnosis not present

## 2017-04-02 DIAGNOSIS — J479 Bronchiectasis, uncomplicated: Secondary | ICD-10-CM | POA: Diagnosis not present

## 2017-04-02 DIAGNOSIS — K5790 Diverticulosis of intestine, part unspecified, without perforation or abscess without bleeding: Secondary | ICD-10-CM | POA: Diagnosis not present

## 2017-04-02 DIAGNOSIS — E039 Hypothyroidism, unspecified: Secondary | ICD-10-CM | POA: Diagnosis not present

## 2017-04-02 DIAGNOSIS — E46 Unspecified protein-calorie malnutrition: Secondary | ICD-10-CM | POA: Diagnosis not present

## 2017-04-02 DIAGNOSIS — M545 Low back pain: Secondary | ICD-10-CM | POA: Diagnosis not present

## 2017-04-02 DIAGNOSIS — E063 Autoimmune thyroiditis: Secondary | ICD-10-CM | POA: Diagnosis not present

## 2017-04-02 DIAGNOSIS — I10 Essential (primary) hypertension: Secondary | ICD-10-CM | POA: Diagnosis not present

## 2017-04-02 DIAGNOSIS — M810 Age-related osteoporosis without current pathological fracture: Secondary | ICD-10-CM | POA: Diagnosis not present

## 2017-04-02 DIAGNOSIS — J439 Emphysema, unspecified: Secondary | ICD-10-CM | POA: Diagnosis not present

## 2017-04-05 DIAGNOSIS — J479 Bronchiectasis, uncomplicated: Secondary | ICD-10-CM | POA: Diagnosis not present

## 2017-04-05 DIAGNOSIS — K5790 Diverticulosis of intestine, part unspecified, without perforation or abscess without bleeding: Secondary | ICD-10-CM | POA: Diagnosis not present

## 2017-04-05 DIAGNOSIS — E46 Unspecified protein-calorie malnutrition: Secondary | ICD-10-CM | POA: Diagnosis not present

## 2017-04-05 DIAGNOSIS — I10 Essential (primary) hypertension: Secondary | ICD-10-CM | POA: Diagnosis not present

## 2017-04-05 DIAGNOSIS — M545 Low back pain: Secondary | ICD-10-CM | POA: Diagnosis not present

## 2017-04-05 DIAGNOSIS — J439 Emphysema, unspecified: Secondary | ICD-10-CM | POA: Diagnosis not present

## 2017-04-10 DIAGNOSIS — K5790 Diverticulosis of intestine, part unspecified, without perforation or abscess without bleeding: Secondary | ICD-10-CM | POA: Diagnosis not present

## 2017-04-10 DIAGNOSIS — J439 Emphysema, unspecified: Secondary | ICD-10-CM | POA: Diagnosis not present

## 2017-04-10 DIAGNOSIS — E46 Unspecified protein-calorie malnutrition: Secondary | ICD-10-CM | POA: Diagnosis not present

## 2017-04-10 DIAGNOSIS — M545 Low back pain: Secondary | ICD-10-CM | POA: Diagnosis not present

## 2017-04-10 DIAGNOSIS — J479 Bronchiectasis, uncomplicated: Secondary | ICD-10-CM | POA: Diagnosis not present

## 2017-04-10 DIAGNOSIS — I10 Essential (primary) hypertension: Secondary | ICD-10-CM | POA: Diagnosis not present

## 2017-04-12 DIAGNOSIS — I10 Essential (primary) hypertension: Secondary | ICD-10-CM | POA: Diagnosis not present

## 2017-04-12 DIAGNOSIS — M545 Low back pain: Secondary | ICD-10-CM | POA: Diagnosis not present

## 2017-04-12 DIAGNOSIS — K5790 Diverticulosis of intestine, part unspecified, without perforation or abscess without bleeding: Secondary | ICD-10-CM | POA: Diagnosis not present

## 2017-04-12 DIAGNOSIS — J439 Emphysema, unspecified: Secondary | ICD-10-CM | POA: Diagnosis not present

## 2017-04-12 DIAGNOSIS — E46 Unspecified protein-calorie malnutrition: Secondary | ICD-10-CM | POA: Diagnosis not present

## 2017-04-12 DIAGNOSIS — J479 Bronchiectasis, uncomplicated: Secondary | ICD-10-CM | POA: Diagnosis not present

## 2017-04-14 ENCOUNTER — Other Ambulatory Visit: Payer: Self-pay | Admitting: Internal Medicine

## 2017-04-16 DIAGNOSIS — J479 Bronchiectasis, uncomplicated: Secondary | ICD-10-CM | POA: Diagnosis not present

## 2017-04-16 DIAGNOSIS — J439 Emphysema, unspecified: Secondary | ICD-10-CM | POA: Diagnosis not present

## 2017-04-16 DIAGNOSIS — I10 Essential (primary) hypertension: Secondary | ICD-10-CM | POA: Diagnosis not present

## 2017-04-16 DIAGNOSIS — M545 Low back pain: Secondary | ICD-10-CM | POA: Diagnosis not present

## 2017-04-16 DIAGNOSIS — E46 Unspecified protein-calorie malnutrition: Secondary | ICD-10-CM | POA: Diagnosis not present

## 2017-04-16 DIAGNOSIS — K5790 Diverticulosis of intestine, part unspecified, without perforation or abscess without bleeding: Secondary | ICD-10-CM | POA: Diagnosis not present

## 2017-04-18 DIAGNOSIS — J479 Bronchiectasis, uncomplicated: Secondary | ICD-10-CM | POA: Diagnosis not present

## 2017-04-18 DIAGNOSIS — K5790 Diverticulosis of intestine, part unspecified, without perforation or abscess without bleeding: Secondary | ICD-10-CM | POA: Diagnosis not present

## 2017-04-18 DIAGNOSIS — E46 Unspecified protein-calorie malnutrition: Secondary | ICD-10-CM | POA: Diagnosis not present

## 2017-04-18 DIAGNOSIS — I10 Essential (primary) hypertension: Secondary | ICD-10-CM | POA: Diagnosis not present

## 2017-04-18 DIAGNOSIS — J439 Emphysema, unspecified: Secondary | ICD-10-CM | POA: Diagnosis not present

## 2017-04-18 DIAGNOSIS — M545 Low back pain: Secondary | ICD-10-CM | POA: Diagnosis not present

## 2017-04-23 DIAGNOSIS — E46 Unspecified protein-calorie malnutrition: Secondary | ICD-10-CM | POA: Diagnosis not present

## 2017-04-23 DIAGNOSIS — J439 Emphysema, unspecified: Secondary | ICD-10-CM | POA: Diagnosis not present

## 2017-04-23 DIAGNOSIS — J479 Bronchiectasis, uncomplicated: Secondary | ICD-10-CM | POA: Diagnosis not present

## 2017-04-23 DIAGNOSIS — M545 Low back pain: Secondary | ICD-10-CM | POA: Diagnosis not present

## 2017-04-23 DIAGNOSIS — I10 Essential (primary) hypertension: Secondary | ICD-10-CM | POA: Diagnosis not present

## 2017-04-23 DIAGNOSIS — K5790 Diverticulosis of intestine, part unspecified, without perforation or abscess without bleeding: Secondary | ICD-10-CM | POA: Diagnosis not present

## 2017-05-01 ENCOUNTER — Ambulatory Visit: Payer: Medicare Other | Admitting: Internal Medicine

## 2017-05-02 DIAGNOSIS — J479 Bronchiectasis, uncomplicated: Secondary | ICD-10-CM | POA: Diagnosis not present

## 2017-05-02 DIAGNOSIS — K5790 Diverticulosis of intestine, part unspecified, without perforation or abscess without bleeding: Secondary | ICD-10-CM | POA: Diagnosis not present

## 2017-05-02 DIAGNOSIS — J439 Emphysema, unspecified: Secondary | ICD-10-CM | POA: Diagnosis not present

## 2017-05-02 DIAGNOSIS — M545 Low back pain: Secondary | ICD-10-CM | POA: Diagnosis not present

## 2017-05-02 DIAGNOSIS — E46 Unspecified protein-calorie malnutrition: Secondary | ICD-10-CM | POA: Diagnosis not present

## 2017-05-02 DIAGNOSIS — I10 Essential (primary) hypertension: Secondary | ICD-10-CM | POA: Diagnosis not present

## 2017-05-08 DIAGNOSIS — K5790 Diverticulosis of intestine, part unspecified, without perforation or abscess without bleeding: Secondary | ICD-10-CM | POA: Diagnosis not present

## 2017-05-08 DIAGNOSIS — E46 Unspecified protein-calorie malnutrition: Secondary | ICD-10-CM | POA: Diagnosis not present

## 2017-05-08 DIAGNOSIS — J439 Emphysema, unspecified: Secondary | ICD-10-CM | POA: Diagnosis not present

## 2017-05-08 DIAGNOSIS — M545 Low back pain: Secondary | ICD-10-CM | POA: Diagnosis not present

## 2017-05-08 DIAGNOSIS — I10 Essential (primary) hypertension: Secondary | ICD-10-CM | POA: Diagnosis not present

## 2017-05-08 DIAGNOSIS — J479 Bronchiectasis, uncomplicated: Secondary | ICD-10-CM | POA: Diagnosis not present

## 2017-05-15 ENCOUNTER — Ambulatory Visit: Payer: Medicare Other | Admitting: Internal Medicine

## 2017-05-22 ENCOUNTER — Telehealth: Payer: Self-pay | Admitting: Internal Medicine

## 2017-05-22 MED ORDER — BECLOMETHASONE DIPROPIONATE 40 MCG/ACT IN AERS: INHALATION_SPRAY | RESPIRATORY_TRACT | 2 refills | Status: DC

## 2017-05-22 NOTE — Telephone Encounter (Signed)
Spoke with pt and advised rx sent to pharmacy. Nothing further is needed.   

## 2017-05-23 ENCOUNTER — Telehealth: Payer: Self-pay | Admitting: Internal Medicine

## 2017-05-23 MED ORDER — BECLOMETHASONE DIPROP HFA 40 MCG/ACT IN AERB: 2.0000 | INHALATION_SPRAY | Freq: Two times a day (BID) | RESPIRATORY_TRACT | 3 refills | Status: DC

## 2017-05-23 NOTE — Telephone Encounter (Signed)
[facility] called and advised that pt needs a new Rx for Qvar, she currently is using the old inhaler that has been discontinued so can we call in the Qvar redihaler 40? CY please advise.    Last OV 03/2016  Current Outpatient Medications on File Prior to Visit  Medication Sig Dispense Refill  . acetaminophen-codeine (TYLENOL #3) 300-30 MG tablet Take 2 tablets by mouth every 6 (six) hours as needed for moderate pain. 30 tablet 0  . ALPRAZolam (XANAX) 0.5 MG tablet Take 0.25 mg by mouth at bedtime.     . ALPRAZolam (XANAX) 0.5 MG tablet Take 0.25-0.5 mg by mouth 2 (two) times daily as needed for anxiety.    Marland Kitchen amitriptyline (ELAVIL) 50 MG tablet Take 50 mg by mouth at bedtime.     Marland Kitchen amLODipine (NORVASC) 5 MG tablet Take 5 mg by mouth daily.      . Azelastine-Fluticasone (DYMISTA) 137-50 MCG/ACT SUSP Place 1-2 puffs into the nose at bedtime. (Patient not taking: Reported on 02/28/2017) 1 Bottle prn  . Azelastine-Fluticasone (DYMISTA) 137-50 MCG/ACT SUSP Place 1-2 sprays into the nose daily. (Patient taking differently: Place 1-2 sprays into the nose daily as needed (for allergies). ) 69 g 3  . beclomethasone (QVAR) 40 MCG/ACT inhaler USE 2 INHALATIONS TWICE A DAY. RINSE MOUTH 26.1 g 2  . benzonatate (TESSALON) 100 MG capsule Take 1 capsule (100 mg total) by mouth every 8 (eight) hours. 21 capsule 0  . Calcium Carbonate-Vit D-Min (CALCIUM 1200 PO) Take 1 capsule by mouth every other day.     . celecoxib (CELEBREX) 200 MG capsule Take 1 capsule (200 mg total) by mouth 2 (two) times daily. 60 capsule 0  . chlorpheniramine-HYDROcodone (TUSSIONEX PENNKINETIC ER) 10-8 MG/5ML SUER Take 5 mLs by mouth every 12 (twelve) hours as needed for cough. 120 mL 0  . cholecalciferol (VITAMIN D) 1000 UNITS tablet Take 1,000 Units by mouth daily.    . Diphenhyd-Hydrocort-Nystatin (FIRST-DUKES MOUTHWASH) SUSP Use as directed 5 mLs in the mouth or throat 3 (three) times daily. SWISH AND SWALLOW 120 mL 0  . Docusate  Calcium (STOOL SOFTENER PO) Take 1 capsule by mouth at bedtime.    . furosemide (LASIX) 20 MG tablet Take 20 mg by mouth daily as needed (for swelling).    . Glucosamine-Chondroit-Vit C-Mn (GLUCOSAMINE CHONDR 1500 COMPLX) CAPS Take by mouth.    Marland Kitchen guaiFENesin (MUCINEX) 600 MG 12 hr tablet Take 600 mg by mouth 2 (two) times daily as needed for cough or to loosen phlegm.    Marland Kitchen HYDROcodone-acetaminophen (NORCO/VICODIN) 5-325 MG tablet Take 1 tablet by mouth every 12 (twelve) hours as needed for severe pain. 8 tablet 0  . ipratropium (ATROVENT HFA) 17 MCG/ACT inhaler Inhale 2 puffs into the lungs every 6 (six) hours as needed for wheezing. 1 Inhaler 11  . levothyroxine (SYNTHROID, LEVOTHROID) 88 MCG tablet Take 88 mcg by mouth daily before breakfast.     . Multiple Vitamin (MULTIVITAMIN) tablet Take 1 tablet by mouth 3 (three) times a week.     Marland Kitchen omeprazole (PRILOSEC) 20 MG capsule Take 1 capsule (20 mg total) by mouth daily. 90 capsule 4  . promethazine (PHENERGAN) 12.5 MG tablet Take 12.5 mg by mouth every 6 (six) hours as needed.    . Psyllium (METAMUCIL) 30.9 % POWD Take 15 mLs by mouth at bedtime.     Marland Kitchen Spacer/Aero-Holding Chambers (AEROCHAMBER MV) inhaler Use as instructed 1 each 0  . SPIRIVA HANDIHALER 18 MCG inhalation capsule  INHALE THE CONTENTS OF 1 CAPSULE DAILY 90 capsule 0  . Throat Lozenges (COUGH DROPS MT) Use as directed 1 each in the mouth or throat as needed (for cough).     . valACYclovir (VALTREX) 1000 MG tablet Take 2 now and 2 in 12 hours for cold sores.     . valsartan-hydrochlorothiazide (DIOVAN-HCT) 320-25 MG per tablet Take 1 tablet by mouth daily.       No current facility-administered medications on file prior to visit.    Allergies  Allergen Reactions  . Asa [Aspirin] Other (See Comments)    Spits up blood  . Boniva [Ibandronic Acid] Other (See Comments)    GI upset  . Cystex [Germanium] Nausea Only  . Doxycycline Nausea Only and Hypertension

## 2017-05-23 NOTE — Telephone Encounter (Signed)
Ok rx sent, nothing further is needed.

## 2017-05-23 NOTE — Telephone Encounter (Signed)
Ok , refill x 1 year

## 2017-05-29 ENCOUNTER — Ambulatory Visit: Payer: Medicare Other | Admitting: Internal Medicine

## 2017-06-22 ENCOUNTER — Inpatient Hospital Stay (HOSPITAL_COMMUNITY)
Admission: EM | Admit: 2017-06-22 | Discharge: 2017-06-29 | DRG: 871 | Disposition: A | Discharge status: Home Health | Payer: Medicare Other | Attending: Family Medicine | Admitting: Family Medicine

## 2017-06-22 ENCOUNTER — Other Ambulatory Visit: Payer: Self-pay

## 2017-06-22 ENCOUNTER — Telehealth: Payer: Self-pay | Admitting: Internal Medicine

## 2017-06-22 DIAGNOSIS — J479 Bronchiectasis, uncomplicated: Secondary | ICD-10-CM

## 2017-06-22 DIAGNOSIS — E890 Postprocedural hypothyroidism: Secondary | ICD-10-CM | POA: Diagnosis present

## 2017-06-22 DIAGNOSIS — F418 Other specified anxiety disorders: Secondary | ICD-10-CM | POA: Diagnosis not present

## 2017-06-22 DIAGNOSIS — J470 Bronchiectasis with acute lower respiratory infection: Secondary | ICD-10-CM | POA: Diagnosis present

## 2017-06-22 DIAGNOSIS — J471 Bronchiectasis with (acute) exacerbation: Secondary | ICD-10-CM | POA: Diagnosis present

## 2017-06-22 DIAGNOSIS — I447 Left bundle-branch block, unspecified: Secondary | ICD-10-CM | POA: Diagnosis present

## 2017-06-22 DIAGNOSIS — E871 Hypo-osmolality and hyponatremia: Secondary | ICD-10-CM | POA: Diagnosis present

## 2017-06-22 DIAGNOSIS — J9601 Acute respiratory failure with hypoxia: Secondary | ICD-10-CM

## 2017-06-22 DIAGNOSIS — R0602 Shortness of breath: Secondary | ICD-10-CM | POA: Diagnosis not present

## 2017-06-22 DIAGNOSIS — E46 Unspecified protein-calorie malnutrition: Secondary | ICD-10-CM | POA: Diagnosis present

## 2017-06-22 DIAGNOSIS — Z79899 Other long term (current) drug therapy: Secondary | ICD-10-CM

## 2017-06-22 DIAGNOSIS — J9621 Acute and chronic respiratory failure with hypoxia: Secondary | ICD-10-CM | POA: Diagnosis not present

## 2017-06-22 DIAGNOSIS — J189 Pneumonia, unspecified organism: Secondary | ICD-10-CM

## 2017-06-22 DIAGNOSIS — Z8249 Family history of ischemic heart disease and other diseases of the circulatory system: Secondary | ICD-10-CM

## 2017-06-22 DIAGNOSIS — A419 Sepsis, unspecified organism: Principal | ICD-10-CM | POA: Diagnosis present

## 2017-06-22 DIAGNOSIS — J181 Lobar pneumonia, unspecified organism: Secondary | ICD-10-CM | POA: Diagnosis present

## 2017-06-22 DIAGNOSIS — J449 Chronic obstructive pulmonary disease, unspecified: Secondary | ICD-10-CM

## 2017-06-22 DIAGNOSIS — E876 Hypokalemia: Secondary | ICD-10-CM | POA: Diagnosis present

## 2017-06-22 DIAGNOSIS — R0902 Hypoxemia: Secondary | ICD-10-CM | POA: Diagnosis not present

## 2017-06-22 DIAGNOSIS — F329 Major depressive disorder, single episode, unspecified: Secondary | ICD-10-CM | POA: Diagnosis present

## 2017-06-22 DIAGNOSIS — Z681 Body mass index (BMI) 19 or less, adult: Secondary | ICD-10-CM

## 2017-06-22 DIAGNOSIS — I10 Essential (primary) hypertension: Secondary | ICD-10-CM | POA: Diagnosis present

## 2017-06-22 DIAGNOSIS — K219 Gastro-esophageal reflux disease without esophagitis: Secondary | ICD-10-CM | POA: Diagnosis present

## 2017-06-22 DIAGNOSIS — Z825 Family history of asthma and other chronic lower respiratory diseases: Secondary | ICD-10-CM

## 2017-06-22 DIAGNOSIS — F419 Anxiety disorder, unspecified: Secondary | ICD-10-CM | POA: Diagnosis present

## 2017-06-22 DIAGNOSIS — J441 Chronic obstructive pulmonary disease with (acute) exacerbation: Secondary | ICD-10-CM | POA: Diagnosis present

## 2017-06-22 DIAGNOSIS — E440 Moderate protein-calorie malnutrition: Secondary | ICD-10-CM | POA: Diagnosis not present

## 2017-06-22 DIAGNOSIS — E039 Hypothyroidism, unspecified: Secondary | ICD-10-CM

## 2017-06-22 DIAGNOSIS — Z79890 Hormone replacement therapy (postmenopausal): Secondary | ICD-10-CM

## 2017-06-22 DIAGNOSIS — J3800 Paralysis of vocal cords and larynx, unspecified: Secondary | ICD-10-CM | POA: Diagnosis present

## 2017-06-22 HISTORY — DX: Essential (primary) hypertension: I10

## 2017-06-22 HISTORY — DX: Hypothyroidism, unspecified: E03.9

## 2017-06-22 HISTORY — DX: Chronic obstructive pulmonary disease, unspecified: J44.9

## 2017-06-22 HISTORY — DX: Other specified anxiety disorders: F41.8

## 2017-06-22 HISTORY — DX: Gastro-esophageal reflux disease without esophagitis: K21.9

## 2017-06-22 MED ORDER — AMOXICILLIN-POT CLAVULANATE 875-125 MG PO TABS: 1.0000 | ORAL_TABLET | Freq: Two times a day (BID) | ORAL | 0 refills | Status: DC

## 2017-06-22 MED ORDER — ALBUTEROL (5 MG/ML) CONTINUOUS INHALATION SOLN
15.0000 mg/h | INHALATION_SOLUTION | Freq: Once | RESPIRATORY_TRACT | Status: AC
Start: 2017-06-23 — End: 2017-06-23
  Administered 2017-06-23: 15 mg/h via RESPIRATORY_TRACT
  Filled 2017-06-22: qty 20

## 2017-06-22 MED ORDER — IPRATROPIUM BROMIDE 0.02 % IN SOLN
0.5000 mg | Freq: Once | RESPIRATORY_TRACT | Status: AC
  Administered 2017-06-23: 0.5 mg via RESPIRATORY_TRACT
  Filled 2017-06-22: qty 2.5

## 2017-06-22 NOTE — ED Triage Notes (Signed)
Per EMS, pt coming from home with acute episode of SOB. EMS found her with her O2 at 61%. EMS gave her 10mg  of albuterol, 125mg  solu medrol, 4mg  of mag. Pt has expiratory wheezing in lower lobes. Pt reports shes had a fever since yesterday.

## 2017-06-22 NOTE — Telephone Encounter (Signed)
Spoke with the pt and notified of recs per [facility]  She verbalized understanding  Rx was sent to pharm  

## 2017-06-22 NOTE — Telephone Encounter (Signed)
Offer augmentin 500 mg, # 14, 1 twice daily.  Have her call Monday if no better.

## 2017-06-22 NOTE — Telephone Encounter (Signed)
Pt returned call Spoke with patient who complains of increased shortness of breath x1 day with wheezing and tightness in her chest.  Pt also reports a prod cough with dark green mucus (pt reports this is normal for her) but with increased symptoms she has noticed some blood tinging her mucus and elevated temp up to 100.0 at onset of symptoms yesterday.    Pt denies any head congestion, PND, chest pain She declines ov at this time because she is afraid of catching the flu Pt is requesting Rx over the phone  Dr Annamaria Boots please advise, thank you [facility]  Allergies  Allergen Reactions  . Asa [Aspirin] Other (See Comments)    Spits up blood  . Boniva [Ibandronic Acid] Other (See Comments)    GI upset  . Cystex [Germanium] Nausea Only  . Doxycycline Nausea Only and Hypertension

## 2017-06-22 NOTE — Telephone Encounter (Signed)
Called number listed and cell phone but no answer. Left a detailed message advising pt to call back to discuss.

## 2017-06-23 ENCOUNTER — Other Ambulatory Visit: Payer: Self-pay

## 2017-06-23 ENCOUNTER — Emergency Department (HOSPITAL_COMMUNITY): Payer: Medicare Other

## 2017-06-23 ENCOUNTER — Encounter (HOSPITAL_COMMUNITY): Payer: Self-pay | Admitting: Internal Medicine

## 2017-06-23 DIAGNOSIS — I447 Left bundle-branch block, unspecified: Secondary | ICD-10-CM | POA: Diagnosis not present

## 2017-06-23 DIAGNOSIS — Z681 Body mass index (BMI) 19 or less, adult: Secondary | ICD-10-CM | POA: Diagnosis not present

## 2017-06-23 DIAGNOSIS — F418 Other specified anxiety disorders: Secondary | ICD-10-CM | POA: Diagnosis present

## 2017-06-23 DIAGNOSIS — J449 Chronic obstructive pulmonary disease, unspecified: Secondary | ICD-10-CM | POA: Diagnosis not present

## 2017-06-23 DIAGNOSIS — J441 Chronic obstructive pulmonary disease with (acute) exacerbation: Secondary | ICD-10-CM | POA: Diagnosis present

## 2017-06-23 DIAGNOSIS — J3800 Paralysis of vocal cords and larynx, unspecified: Secondary | ICD-10-CM | POA: Diagnosis present

## 2017-06-23 DIAGNOSIS — E440 Moderate protein-calorie malnutrition: Secondary | ICD-10-CM

## 2017-06-23 DIAGNOSIS — F419 Anxiety disorder, unspecified: Secondary | ICD-10-CM | POA: Diagnosis present

## 2017-06-23 DIAGNOSIS — J471 Bronchiectasis with (acute) exacerbation: Secondary | ICD-10-CM | POA: Diagnosis present

## 2017-06-23 DIAGNOSIS — Z825 Family history of asthma and other chronic lower respiratory diseases: Secondary | ICD-10-CM | POA: Diagnosis not present

## 2017-06-23 DIAGNOSIS — R0602 Shortness of breath: Secondary | ICD-10-CM | POA: Diagnosis not present

## 2017-06-23 DIAGNOSIS — E890 Postprocedural hypothyroidism: Secondary | ICD-10-CM | POA: Diagnosis present

## 2017-06-23 DIAGNOSIS — J470 Bronchiectasis with acute lower respiratory infection: Secondary | ICD-10-CM | POA: Diagnosis present

## 2017-06-23 DIAGNOSIS — E871 Hypo-osmolality and hyponatremia: Secondary | ICD-10-CM | POA: Diagnosis not present

## 2017-06-23 DIAGNOSIS — E039 Hypothyroidism, unspecified: Secondary | ICD-10-CM | POA: Diagnosis not present

## 2017-06-23 DIAGNOSIS — A419 Sepsis, unspecified organism: Secondary | ICD-10-CM | POA: Diagnosis present

## 2017-06-23 DIAGNOSIS — J479 Bronchiectasis, uncomplicated: Secondary | ICD-10-CM | POA: Diagnosis not present

## 2017-06-23 DIAGNOSIS — E876 Hypokalemia: Secondary | ICD-10-CM

## 2017-06-23 DIAGNOSIS — F329 Major depressive disorder, single episode, unspecified: Secondary | ICD-10-CM | POA: Diagnosis present

## 2017-06-23 DIAGNOSIS — K219 Gastro-esophageal reflux disease without esophagitis: Secondary | ICD-10-CM

## 2017-06-23 DIAGNOSIS — J181 Lobar pneumonia, unspecified organism: Secondary | ICD-10-CM | POA: Diagnosis not present

## 2017-06-23 DIAGNOSIS — J9621 Acute and chronic respiratory failure with hypoxia: Secondary | ICD-10-CM | POA: Diagnosis present

## 2017-06-23 DIAGNOSIS — Z79899 Other long term (current) drug therapy: Secondary | ICD-10-CM | POA: Diagnosis not present

## 2017-06-23 DIAGNOSIS — Z8249 Family history of ischemic heart disease and other diseases of the circulatory system: Secondary | ICD-10-CM | POA: Diagnosis not present

## 2017-06-23 DIAGNOSIS — Z79890 Hormone replacement therapy (postmenopausal): Secondary | ICD-10-CM | POA: Diagnosis not present

## 2017-06-23 DIAGNOSIS — I10 Essential (primary) hypertension: Secondary | ICD-10-CM

## 2017-06-23 DIAGNOSIS — J189 Pneumonia, unspecified organism: Secondary | ICD-10-CM | POA: Diagnosis not present

## 2017-06-23 LAB — HIV ANTIBODY (ROUTINE TESTING W REFLEX): HIV SCREEN 4TH GENERATION: NONREACTIVE

## 2017-06-23 LAB — CBC WITH DIFFERENTIAL/PLATELET
BASOS ABS: 0 10*3/uL (ref 0.0–0.1)
BASOS PCT: 0 %
Eosinophils Absolute: 0.2 10*3/uL (ref 0.0–0.7)
Eosinophils Relative: 1 %
HEMATOCRIT: 36.9 % (ref 36.0–46.0)
HEMOGLOBIN: 12.5 g/dL (ref 12.0–15.0)
LYMPHS PCT: 12 %
Lymphs Abs: 2 10*3/uL (ref 0.7–4.0)
MCH: 28.6 pg (ref 26.0–34.0)
MCHC: 33.9 g/dL (ref 30.0–36.0)
MCV: 84.4 fL (ref 78.0–100.0)
Monocytes Absolute: 1.7 10*3/uL — ABNORMAL HIGH (ref 0.1–1.0)
Monocytes Relative: 10 %
NEUTROS ABS: 12.9 10*3/uL — AB (ref 1.7–7.7)
NEUTROS PCT: 77 %
Platelets: 378 10*3/uL (ref 150–400)
RBC: 4.37 MIL/uL (ref 3.87–5.11)
RDW: 13.2 % (ref 11.5–15.5)
WBC: 16.7 10*3/uL — ABNORMAL HIGH (ref 4.0–10.5)

## 2017-06-23 LAB — RESPIRATORY PANEL BY PCR
Adenovirus: NOT DETECTED
BORDETELLA PERTUSSIS-RVPCR: NOT DETECTED
CHLAMYDOPHILA PNEUMONIAE-RVPPCR: NOT DETECTED
Coronavirus 229E: NOT DETECTED
Coronavirus HKU1: NOT DETECTED
Coronavirus NL63: NOT DETECTED
Coronavirus OC43: NOT DETECTED
INFLUENZA A-RVPPCR: NOT DETECTED
INFLUENZA B-RVPPCR: NOT DETECTED
Metapneumovirus: NOT DETECTED
Mycoplasma pneumoniae: NOT DETECTED
PARAINFLUENZA VIRUS 3-RVPPCR: NOT DETECTED
PARAINFLUENZA VIRUS 4-RVPPCR: NOT DETECTED
Parainfluenza Virus 1: NOT DETECTED
Parainfluenza Virus 2: NOT DETECTED
RHINOVIRUS / ENTEROVIRUS - RVPPCR: NOT DETECTED
Respiratory Syncytial Virus: NOT DETECTED

## 2017-06-23 LAB — LIPID PANEL
CHOLESTEROL: 79 mg/dL (ref 0–200)
HDL: 40 mg/dL — ABNORMAL LOW (ref >40)
LDL Cholesterol: 31 mg/dL (ref 0–99)
Total CHOL/HDL Ratio: 2 RATIO
Triglycerides: 39 mg/dL (ref <150)
VLDL: 8 mg/dL (ref 0–40)

## 2017-06-23 LAB — BASIC METABOLIC PANEL
ANION GAP: 12 (ref 5–15)
BUN: 9 mg/dL (ref 6–20)
CHLORIDE: 93 mmol/L — AB (ref 101–111)
CO2: 23 mmol/L (ref 22–32)
Calcium: 8.3 mg/dL — ABNORMAL LOW (ref 8.9–10.3)
Creatinine, Ser: 0.74 mg/dL (ref 0.44–1.00)
GFR calc non Af Amer: >60 mL/min (ref >60)
GLUCOSE: 162 mg/dL — AB (ref 65–99)
POTASSIUM: 3 mmol/L — AB (ref 3.5–5.1)
Sodium: 128 mmol/L — ABNORMAL LOW (ref 135–145)

## 2017-06-23 LAB — SODIUM, URINE, RANDOM

## 2017-06-23 LAB — LACTIC ACID, PLASMA
LACTIC ACID, VENOUS: 2.9 mmol/L — AB (ref 0.5–1.9)
LACTIC ACID, VENOUS: 4.2 mmol/L — AB (ref 0.5–1.9)
Lactic Acid, Venous: 3.7 mmol/L (ref 0.5–1.9)

## 2017-06-23 LAB — OSMOLALITY, URINE: OSMOLALITY UR: 240 mosm/kg — AB (ref 300–900)

## 2017-06-23 LAB — TROPONIN I
Troponin I: <0.03 ng/mL (ref <0.03)
Troponin I: <0.03 ng/mL (ref <0.03)

## 2017-06-23 LAB — I-STAT ARTERIAL BLOOD GAS, ED
Bicarbonate: 23.9 mmol/L (ref 20.0–28.0)
O2 Saturation: 98 %
PCO2 ART: 35 mmHg (ref 32.0–48.0)
PH ART: 7.441 (ref 7.350–7.450)
PO2 ART: 108 mmHg (ref 83.0–108.0)
TCO2: 25 mmol/L (ref 22–32)

## 2017-06-23 LAB — STREP PNEUMONIAE URINARY ANTIGEN: Strep Pneumo Urinary Antigen: NEGATIVE

## 2017-06-23 LAB — APTT: aPTT: 42 seconds — ABNORMAL HIGH (ref 24–36)

## 2017-06-23 LAB — OSMOLALITY: Osmolality: 278 mOsm/kg (ref 275–295)

## 2017-06-23 LAB — PROTIME-INR
INR: 1.36
Prothrombin Time: 16.7 seconds — ABNORMAL HIGH (ref 11.4–15.2)

## 2017-06-23 LAB — MAGNESIUM: Magnesium: 2.3 mg/dL (ref 1.7–2.4)

## 2017-06-23 LAB — PROCALCITONIN: Procalcitonin: 0.12 ng/mL

## 2017-06-23 LAB — INFLUENZA PANEL BY PCR (TYPE A & B)
Influenza A By PCR: NEGATIVE
Influenza B By PCR: NEGATIVE

## 2017-06-23 LAB — TSH: TSH: 0.443 u[IU]/mL (ref 0.350–4.500)

## 2017-06-23 LAB — BRAIN NATRIURETIC PEPTIDE: B Natriuretic Peptide: 183 pg/mL — ABNORMAL HIGH (ref 0.0–100.0)

## 2017-06-23 MED ORDER — BECLOMETHASONE DIPROP HFA 40 MCG/ACT IN AERB: 2.0000 | INHALATION_SPRAY | Freq: Two times a day (BID) | RESPIRATORY_TRACT | Status: DC

## 2017-06-23 MED ORDER — POTASSIUM CHLORIDE 20 MEQ/15ML (10%) PO SOLN
40.0000 meq | Freq: Once | ORAL | Status: AC
  Administered 2017-06-23: 40 meq via ORAL
  Filled 2017-06-23: qty 30

## 2017-06-23 MED ORDER — AMITRIPTYLINE HCL 50 MG PO TABS
50.0000 mg | ORAL_TABLET | Freq: Every day | ORAL | Status: DC
  Administered 2017-06-23 – 2017-06-28 (×6): 50 mg via ORAL
  Filled 2017-06-23 (×7): qty 1

## 2017-06-23 MED ORDER — LEVALBUTEROL HCL 1.25 MG/0.5ML IN NEBU
1.2500 mg | INHALATION_SOLUTION | Freq: Four times a day (QID) | RESPIRATORY_TRACT | Status: DC
  Filled 2017-06-23: qty 0.5

## 2017-06-23 MED ORDER — HYDRALAZINE HCL 20 MG/ML IJ SOLN: 5.0000 mg | INTRAMUSCULAR | Status: DC | PRN

## 2017-06-23 MED ORDER — POTASSIUM CHLORIDE 10 MEQ/100ML IV SOLN
10.0000 meq | Freq: Once | INTRAVENOUS | Status: AC
  Administered 2017-06-23: 10 meq via INTRAVENOUS

## 2017-06-23 MED ORDER — LEVALBUTEROL HCL 1.25 MG/0.5ML IN NEBU
1.2500 mg | INHALATION_SOLUTION | Freq: Three times a day (TID) | RESPIRATORY_TRACT | Status: DC
  Administered 2017-06-23 (×2): 1.25 mg via RESPIRATORY_TRACT
  Filled 2017-06-23 (×2): qty 0.5

## 2017-06-23 MED ORDER — IPRATROPIUM BROMIDE 0.02 % IN SOLN
0.5000 mg | Freq: Three times a day (TID) | RESPIRATORY_TRACT | Status: DC
  Administered 2017-06-23 – 2017-06-26 (×10): 0.5 mg via RESPIRATORY_TRACT
  Filled 2017-06-23 (×10): qty 2.5

## 2017-06-23 MED ORDER — DEXTROSE 5 % IV SOLN
1.0000 g | Freq: Once | INTRAVENOUS | Status: AC
  Administered 2017-06-23: 1 g via INTRAVENOUS
  Filled 2017-06-23: qty 10

## 2017-06-23 MED ORDER — SODIUM CHLORIDE 0.9 % IV BOLUS (SEPSIS)
500.0000 mL | Freq: Once | INTRAVENOUS | Status: AC
  Administered 2017-06-23: 500 mL via INTRAVENOUS

## 2017-06-23 MED ORDER — DEXTROSE 5 % IV SOLN
500.0000 mg | Freq: Once | INTRAVENOUS | Status: AC
  Administered 2017-06-23: 500 mg via INTRAVENOUS
  Filled 2017-06-23: qty 500

## 2017-06-23 MED ORDER — SODIUM CHLORIDE 0.9 % IV SOLN
INTRAVENOUS | Status: DC
  Administered 2017-06-23: 07:00:00 via INTRAVENOUS

## 2017-06-23 MED ORDER — DEXTROSE 5 % IV SOLN
1.0000 g | INTRAVENOUS | Status: DC
  Administered 2017-06-23: 1 g via INTRAVENOUS
  Filled 2017-06-23 (×2): qty 10

## 2017-06-23 MED ORDER — CALCIUM CARBONATE-VITAMIN D 500-200 MG-UNIT PO TABS
2.0000 | ORAL_TABLET | ORAL | Status: DC
  Administered 2017-06-24 – 2017-06-28 (×3): 2 via ORAL
  Filled 2017-06-23 (×4): qty 2

## 2017-06-23 MED ORDER — BUDESONIDE 0.5 MG/2ML IN SUSP
0.5000 mg | Freq: Two times a day (BID) | RESPIRATORY_TRACT | Status: DC
  Administered 2017-06-23 – 2017-06-29 (×12): 0.5 mg via RESPIRATORY_TRACT
  Filled 2017-06-23 (×12): qty 2

## 2017-06-23 MED ORDER — POTASSIUM CHLORIDE 10 MEQ/100ML IV SOLN
10.0000 meq | INTRAVENOUS | Status: AC
  Administered 2017-06-23: 10 meq via INTRAVENOUS
  Filled 2017-06-23 (×2): qty 100

## 2017-06-23 MED ORDER — IPRATROPIUM BROMIDE 0.02 % IN SOLN: 0.5000 mg | RESPIRATORY_TRACT | Status: DC

## 2017-06-23 MED ORDER — ARFORMOTEROL TARTRATE 15 MCG/2ML IN NEBU
15.0000 ug | INHALATION_SOLUTION | Freq: Two times a day (BID) | RESPIRATORY_TRACT | Status: DC
  Administered 2017-06-23 – 2017-06-29 (×12): 15 ug via RESPIRATORY_TRACT
  Filled 2017-06-23 (×12): qty 2

## 2017-06-23 MED ORDER — DM-GUAIFENESIN ER 30-600 MG PO TB12
1.0000 | ORAL_TABLET | Freq: Two times a day (BID) | ORAL | Status: DC | PRN
  Administered 2017-06-23: 1 via ORAL
  Filled 2017-06-23: qty 1

## 2017-06-23 MED ORDER — GLUCOSAMINE CHONDR 1500 COMPLX PO CAPS: 1.0000 | ORAL_CAPSULE | Freq: Every day | ORAL | Status: DC

## 2017-06-23 MED ORDER — ENSURE ENLIVE PO LIQD
237.0000 mL | Freq: Two times a day (BID) | ORAL | Status: DC
  Administered 2017-06-23 – 2017-06-28 (×11): 237 mL via ORAL

## 2017-06-23 MED ORDER — CELECOXIB 200 MG PO CAPS
200.0000 mg | ORAL_CAPSULE | Freq: Two times a day (BID) | ORAL | Status: DC
  Administered 2017-06-23 – 2017-06-29 (×12): 200 mg via ORAL
  Filled 2017-06-23 (×14): qty 1

## 2017-06-23 MED ORDER — ADULT MULTIVITAMIN W/MINERALS CH
1.0000 | ORAL_TABLET | ORAL | Status: DC
Start: 2017-06-25 — End: 2017-06-29
  Administered 2017-06-25 – 2017-06-29 (×3): 1 via ORAL
  Filled 2017-06-23 (×5): qty 1

## 2017-06-23 MED ORDER — LEVOTHYROXINE SODIUM 88 MCG PO TABS
88.0000 ug | ORAL_TABLET | Freq: Every day | ORAL | Status: DC
  Administered 2017-06-23 – 2017-06-27 (×5): 88 ug via ORAL
  Filled 2017-06-23 (×7): qty 1

## 2017-06-23 MED ORDER — HYDROXYZINE HCL 10 MG PO TABS: 10.0000 mg | ORAL_TABLET | Freq: Three times a day (TID) | ORAL | Status: DC | PRN

## 2017-06-23 MED ORDER — ZOLPIDEM TARTRATE 5 MG PO TABS: 5.0000 mg | ORAL_TABLET | Freq: Every evening | ORAL | Status: DC | PRN

## 2017-06-23 MED ORDER — AZELASTINE-FLUTICASONE 137-50 MCG/ACT NA SUSP: 1.0000 | Freq: Every day | NASAL | Status: DC

## 2017-06-23 MED ORDER — AZELASTINE HCL 0.1 % NA SOLN
1.0000 | Freq: Every day | NASAL | Status: DC
  Administered 2017-06-23: 2 via NASAL
  Administered 2017-06-24 – 2017-06-25 (×2): 1 via NASAL
  Administered 2017-06-26: 2 via NASAL
  Administered 2017-06-28 – 2017-06-29 (×2): 1 via NASAL
  Filled 2017-06-23: qty 30

## 2017-06-23 MED ORDER — LEVALBUTEROL HCL 1.25 MG/0.5ML IN NEBU
1.2500 mg | INHALATION_SOLUTION | RESPIRATORY_TRACT | Status: DC | PRN
  Administered 2017-06-23 – 2017-06-24 (×2): 1.25 mg via RESPIRATORY_TRACT
  Filled 2017-06-23 (×2): qty 0.5

## 2017-06-23 MED ORDER — TIOTROPIUM BROMIDE MONOHYDRATE 18 MCG IN CAPS
1.0000 | ORAL_CAPSULE | Freq: Every day | RESPIRATORY_TRACT | Status: DC
  Administered 2017-06-23 – 2017-06-29 (×6): 18 ug via RESPIRATORY_TRACT
  Filled 2017-06-23 (×2): qty 5

## 2017-06-23 MED ORDER — METHYLPREDNISOLONE SODIUM SUCC 40 MG IJ SOLR
40.0000 mg | Freq: Two times a day (BID) | INTRAMUSCULAR | Status: DC
  Administered 2017-06-23 – 2017-06-24 (×3): 40 mg via INTRAVENOUS
  Filled 2017-06-23 (×3): qty 1

## 2017-06-23 MED ORDER — PANTOPRAZOLE SODIUM 40 MG PO TBEC
40.0000 mg | DELAYED_RELEASE_TABLET | Freq: Every day | ORAL | Status: DC
  Administered 2017-06-23 – 2017-06-29 (×7): 40 mg via ORAL
  Filled 2017-06-23 (×7): qty 1

## 2017-06-23 MED ORDER — DEXTROSE 5 % IV SOLN
500.0000 mg | INTRAVENOUS | Status: DC
  Administered 2017-06-24: 500 mg via INTRAVENOUS
  Filled 2017-06-23 (×2): qty 500

## 2017-06-23 MED ORDER — AMLODIPINE BESYLATE 5 MG PO TABS
5.0000 mg | ORAL_TABLET | Freq: Every evening | ORAL | Status: DC
  Administered 2017-06-23 – 2017-06-28 (×6): 5 mg via ORAL
  Filled 2017-06-23 (×6): qty 1

## 2017-06-23 MED ORDER — ALPRAZOLAM 0.25 MG PO TABS
0.2500 mg | ORAL_TABLET | Freq: Two times a day (BID) | ORAL | Status: DC | PRN
  Administered 2017-06-23: 0.25 mg via ORAL
  Administered 2017-06-23 – 2017-06-24 (×2): 0.5 mg via ORAL
  Administered 2017-06-24 – 2017-06-28 (×2): 0.25 mg via ORAL
  Filled 2017-06-23 (×5): qty 2

## 2017-06-23 MED ORDER — ACETAMINOPHEN 325 MG PO TABS
650.0000 mg | ORAL_TABLET | Freq: Four times a day (QID) | ORAL | Status: DC | PRN
  Administered 2017-06-26: 650 mg via ORAL
  Filled 2017-06-23: qty 2

## 2017-06-23 MED ORDER — VITAMIN D 1000 UNITS PO TABS
1000.0000 [IU] | ORAL_TABLET | Freq: Every day | ORAL | Status: DC
  Administered 2017-06-23 – 2017-06-29 (×7): 1000 [IU] via ORAL
  Filled 2017-06-23 (×7): qty 1

## 2017-06-23 MED ORDER — FLUTICASONE PROPIONATE 50 MCG/ACT NA SUSP
1.0000 | Freq: Every day | NASAL | Status: DC
  Administered 2017-06-23: 2 via NASAL
  Administered 2017-06-24 – 2017-06-25 (×2): 1 via NASAL
  Administered 2017-06-26 – 2017-06-27 (×2): 2 via NASAL
  Administered 2017-06-28 – 2017-06-29 (×2): 1 via NASAL
  Filled 2017-06-23: qty 16

## 2017-06-23 MED ORDER — IRBESARTAN 300 MG PO TABS: 300.0000 mg | ORAL_TABLET | Freq: Every day | ORAL | Status: DC

## 2017-06-23 MED ORDER — SODIUM CHLORIDE 0.9 % IV BOLUS (SEPSIS)
1500.0000 mL | Freq: Once | INTRAVENOUS | Status: AC
  Administered 2017-06-23: 1500 mL via INTRAVENOUS

## 2017-06-23 NOTE — Progress Notes (Signed)
Flutter valve provided to pt, education provided at bedside with pt and pt family  Pt demonstrates use appropriately

## 2017-06-23 NOTE — ED Notes (Signed)
ED Provider at bedside. 

## 2017-06-23 NOTE — Progress Notes (Signed)
[facility]   Patient admitted after midnight see H&P for full details   83 y/o F admitted with dyspnea found to have PNA. Patient on placed on IV abx. Will add nebulizer treatment, will order ECHO as patient had slight elevated BNP and new LBBB. Add Flutter valve, OOB as tolerated. Continue to monitor wean O2 as tolerated. Patient not on O2 at baseline. Continue to monitor for now.   Rest per H&P   Chipper Oman, MD

## 2017-06-23 NOTE — H&P (Addendum)
History and Physical    [name] JJO:[id number] DOB: [birth year] DOA: 06/22/2017  Referring MD/NP/PA:   PCP: Lujean Amel, MD   Patient coming from:  The patient is coming from home.  At baseline, pt is independent for most of ADL.  Chief Complaint: Cough, shortness of breath, fever  HPI: [name] is a 83 y.o. female with medical history significant of COPD, bronchiectasis, vocal cord paralysis, hypertension, GERD, hypothyroidism, depression with anxiety, who presents with cough, shortness breath and fever.  Patient states that she has been having shortness of breath in the past 2 days, which has been progressively getting worse. She speaks in full sentence. She coughs up pink colored mucus. She also has fever for 100.2 and chills. Patient denies chest pain, runny nose or sore throat. No nausea, vomiting, diarrhea or abdominal pain. No symptoms of UTI. No unilateral weakness. Patient was found to have oxygen desaturation to 61% by EMS.  ED Course: pt was found to have WBC 16.7, potassium 3.0, sodium 128, creatinine normal, negative troponin, BNP 183.0, temperature 98.2, tachycardia, tachypnea, oxygen saturation 94% on 2 L nasal cannula oxygen. Chest x-ray showed suprahilar opacity. ABG with pH of 7.441, PCO2 35, PO2 108. Patient is admitted to telemetry bed as inpatient.   Review of Systems:   General: has fevers, chills, no body weight gain, has fatigue HEENT: no blurry vision, hearing changes or sore throat Respiratory: has dyspnea, coughing, no wheezing CV: no chest pain, no palpitations GI: no nausea, vomiting, abdominal pain, diarrhea, constipation GU: no dysuria, burning on urination, increased urinary frequency, hematuria  Ext: no leg edema Neuro: no unilateral weakness, numbness, or tingling, no vision change or hearing loss Skin: no rash, no skin tear. MSK: No muscle spasm, no deformity, no limitation of range of movement in spin Heme: No easy bruising.    Travel history: No recent long distant travel.  Allergy:  Allergies  Allergen Reactions  . Asa [Aspirin] Other (See Comments)    Spits up blood  . Boniva [Ibandronic Acid] Other (See Comments)    GI upset  . Cystex [Germanium] Nausea Only  . Doxycycline Nausea Only and Hypertension    Past Medical History:  Diagnosis Date  . Bronchiectasis   . COPD (chronic obstructive pulmonary disease) ([place])   . Depression with anxiety   . Essential hypertension   . GERD (gastroesophageal reflux disease)   . Hashimoto's thyroiditis   . Hypothyroidism   . Vocal cord paralysis     Past Surgical History:  Procedure Laterality Date  . APPENDECTOMY    . HEMORRHOID SURGERY    . hysteroscopy x 2    . THYROIDECTOMY    . TUBAL LIGATION      Social History:  reports that  has never smoked. She does not have any smokeless tobacco history on file. Her alcohol and drug histories are not on file.  Family History:  Family History  Problem Relation Age of Onset  . COPD Father   . Hypertension Father      Prior to Admission medications   Medication Sig Start Date End Date Taking? Authorizing Provider  ALPRAZolam Duanne Moron) 0.5 MG tablet Take 0.25-0.5 mg by mouth 2 (two) times daily as needed for anxiety.   Yes [provider]  amitriptyline (ELAVIL) 50 MG tablet Take 50 mg by mouth at bedtime.    Yes [provider]  amLODipine (NORVASC) 5 MG tablet Take 5 mg by mouth every evening.    Yes [provider]  amoxicillin-clavulanate (AUGMENTIN) 875-125 MG tablet Take 1 tablet by mouth 2 (two) times daily. 06/22/17  Yes Young, Tarri Fuller D, MD  Azelastine-Fluticasone [facility]) 137-50 MCG/ACT SUSP Place 1-2 sprays into the nose daily. Patient taking differently: Place 1-2 sprays into the nose daily as needed (for allergies).  08/02/16  Yes Young, Tarri Fuller D, MD  beclomethasone (QVAR REDIHALER) 40 MCG/ACT inhaler Inhale 2 puffs 2 (two) times daily into the lungs. 05/23/17  Yes  Young, Tarri Fuller D, MD  Calcium Carbonate-Vit D-Min (CALCIUM 1200 PO) Take 1 capsule by mouth every other day.    Yes [provider]  celecoxib (CELEBREX) 200 MG capsule Take 1 capsule (200 mg total) by mouth 2 (two) times daily. Patient taking differently: Take 200 mg by mouth 2 (two) times daily as needed for mild pain.  06/23/16  Yes Marybelle Killings, MD  cholecalciferol (VITAMIN D) 1000 UNITS tablet Take 1,000 Units by mouth daily.   Yes [provider]  Glucosamine-Chondroit-Vit C-Mn (GLUCOSAMINE CHONDR 1500 COMPLX) CAPS Take 1 capsule by mouth daily.    Yes [provider]  ipratropium (ATROVENT HFA) 17 MCG/ACT inhaler Inhale 2 puffs into the lungs every 6 (six) hours as needed for wheezing. 02/21/16  Yes Young, Tarri Fuller D, MD  irbesartan (AVAPRO) 300 MG tablet Take 300 mg by mouth daily. 05/15/17  Yes [provider]  levothyroxine (SYNTHROID, LEVOTHROID) 88 MCG tablet Take 88 mcg by mouth daily before breakfast.    Yes [provider]  Multiple Vitamin (MULTIVITAMIN) tablet Take 1 tablet by mouth every Monday, Wednesday, and Friday.    Yes [provider]  omeprazole (PRILOSEC) 20 MG capsule Take 1 capsule (20 mg total) by mouth daily. 02/01/17  Yes Young, Tarri Fuller D, MD  SPIRIVA HANDIHALER 18 MCG inhalation capsule INHALE THE CONTENTS OF 1 CAPSULE DAILY 04/16/17  Yes Young, Tarri Fuller D, MD  acetaminophen-codeine (TYLENOL #3) 300-30 MG tablet Take 2 tablets by mouth every 6 (six) hours as needed for moderate pain. Patient not taking: Reported on 06/23/2017 02/28/17   Varney Biles, MD  Azelastine-Fluticasone [facility]) 137-50 MCG/ACT SUSP Place 1-2 puffs into the nose at bedtime. Patient not taking: Reported on 06/23/2017 01/06/16 06/24/23  Deneise Lever, MD  beclomethasone (QVAR) 40 MCG/ACT inhaler USE 2 INHALATIONS TWICE A DAY. RINSE MOUTH Patient not taking: Reported on 06/23/2017 05/22/17   Deneise Lever, MD  benzonatate (TESSALON) 100 MG  capsule Take 1 capsule (100 mg total) by mouth every 8 (eight) hours. Patient not taking: Reported on 06/23/2017 02/28/17   Varney Biles, MD  chlorpheniramine-HYDROcodone [facility] PENNKINETIC ER) 10-8 MG/5ML SUER Take 5 mLs by mouth every 12 (twelve) hours as needed for cough. Patient not taking: Reported on 06/23/2017 03/29/17   Deneise Lever, MD  Diphenhyd-Hydrocort-Nystatin (FIRST-DUKES MOUTHWASH) SUSP Use as directed 5 mLs in the mouth or throat 3 (three) times daily. SWISH AND SWALLOW Patient not taking: Reported on 06/23/2017 01/14/16   Baird Lyons D, MD  HYDROcodone-acetaminophen (NORCO/VICODIN) 5-325 MG tablet Take 1 tablet by mouth every 12 (twelve) hours as needed for severe pain. Patient not taking: Reported on 06/23/2017 02/28/17   Varney Biles, MD  Spacer/Aero-Holding Chambers (AEROCHAMBER MV) inhaler Use as instructed 04/02/13   Deneise Lever, MD    Physical Exam: Vitals:   06/23/17 0031 06/23/17 0045 06/23/17 0100 06/23/17 0115  BP:  (!) 127/55 (!) 116/53 (!) 126/59  Pulse:  (!) 103 (!) 107 (!) 110  Resp:  (!) 24 (!) 25 (!) 29  Temp:      TempSrc:      SpO2: 94% 100% 100% 98%  Weight:      Height:       General: Not in acute distress HEENT:       Eyes: PERRL, EOMI, no scleral icterus.       ENT: No discharge from the ears and nose, no pharynx injection, no tonsillar enlargement.        Neck: No JVD, no bruit, no mass felt. Heme: No neck lymph node enlargement. Cardiac: S1/S2, RRR, No murmurs, No gallops or rubs. Respiratory: Patient has coarse breathing sound and rhonchi bilaterally, decreased air movement bilaterally. Pt had mild wheezing at arrival per ED. GI: Soft, nondistended, nontender, no rebound pain, no organomegaly, BS present. GU: No hematuria Ext: No pitting leg edema bilaterally. 2+DP/PT pulse bilaterally. Musculoskeletal: No joint deformities, No joint redness or warmth, no limitation of ROM in spin. Skin: No rashes.  Neuro: Alert, oriented  X3, cranial nerves II-XII grossly intact, moves all extremities normally. Psych: Patient is not psychotic, no suicidal or hemocidal ideation.  Labs on Admission: I have personally reviewed following labs and imaging studies  CBC: Recent Labs  Lab 06/23/17 0004  WBC 16.7*  NEUTROABS 12.9*  HGB 12.5  HCT 36.9  MCV 84.4  PLT 638   Basic Metabolic Panel: Recent Labs  Lab 06/23/17 0004  NA 128*  K 3.0*  CL 93*  CO2 23  GLUCOSE 162*  BUN 9  CREATININE 0.74  CALCIUM 8.3*   GFR: Estimated Creatinine Clearance: 37.5 mL/min (by C-G formula based on SCr of 0.74 mg/dL). Liver Function Tests: No results for input(s): AST, ALT, ALKPHOS, BILITOT, PROT, ALBUMIN in the last 168 hours. No results for input(s): LIPASE, AMYLASE in the last 168 hours. No results for input(s): AMMONIA in the last 168 hours. Coagulation Profile: No results for input(s): INR, PROTIME in the last 168 hours. Cardiac Enzymes: Recent Labs  Lab 06/23/17 0004  TROPONINI <0.03   BNP (last 3 results) No results for input(s): PROBNP in the last 8760 hours. HbA1C: No results for input(s): HGBA1C in the last 72 hours. CBG: No results for input(s): GLUCAP in the last 168 hours. Lipid Profile: No results for input(s): CHOL, HDL, LDLCALC, TRIG, CHOLHDL, LDLDIRECT in the last 72 hours. Thyroid Function Tests: No results for input(s): TSH, T4TOTAL, FREET4, T3FREE, THYROIDAB in the last 72 hours. Anemia Panel: No results for input(s): VITAMINB12, FOLATE, FERRITIN, TIBC, IRON, RETICCTPCT in the last 72 hours. Urine analysis: No results found for: COLORURINE, APPEARANCEUR, LABSPEC, PHURINE, GLUCOSEU, HGBUR, BILIRUBINUR, KETONESUR, PROTEINUR, UROBILINOGEN, NITRITE, LEUKOCYTESUR Sepsis Labs: @LABRCNTIP (procalcitonin:4,lacticidven:4) )No results found for this or any previous visit (from the past 240 hour(s)).   Radiological Exams on Admission: Dg Chest Portable 1 View  Result Date: 06/23/2017 CLINICAL DATA:   Shortness of breath tonight. EXAM: PORTABLE CHEST 1 VIEW COMPARISON:  Radiograph 12/08/2016 FINDINGS: Heart size upper normal. Atherosclerosis of the aortic arch. Symmetric patchy suprahilar opacities are new from prior exam. Background diffuse chronic interstitial thickening is stable. Again seen biapical pleuroparenchymal scarring. Possible blunting of both costophrenic angles. No pneumothorax. Remote right rib fracture. IMPRESSION: Symmetric patchy suprahilar opacities, new from prior exam, may be pulmonary edema, infectious or inflammatory. Possible small pleural effusions. Background interstitial thickening and underlying chronic lung disease again seen. Electronically Signed   By: Jeb Levering M.D.   On: 06/23/2017 01:20     EKG: Independently reviewed.  Sinus rhythm, QTC 5020, LAD, left bundle blockage which  is new, poor R-wave progression  Assessment/Plan Principal Problem:   Acute on chronic respiratory failure with hypoxia (HCC) Active Problems:   Vocal cord paresis   BRONCHIECTASIS   Protein calorie malnutrition (HCC)   GERD (gastroesophageal reflux disease)   Lobar pneumonia (HCC)   LBBB (left bundle branch block)   Hyponatremia   Hypokalemia   Sepsis ([place])   Essential hypertension   COPD with acute exacerbation (HCC)   Depression with anxiety   Hypothyroidism   Acute on chronic respiratory failure with hypoxia due to exacerbation of COPD/bronchiectasis and possible lobar pneumonia and sepsis: Patient has productive cough, shortness of breath and bronchitis/wheezing on auscultation, consistent with COPD exacerbation. Chest x-ray showed possible suprahilar infiltration, indicating possible lobar pneumonia. Patient meets criteria for sepsis with leukocytosis, tachycardia, tachypnea. Lactic acid elevated at 2.9. Currently hemodynamically stable. Pt coughs up pink colored mucus, indicating possible mild hemoptysis. A potential differential diagnosis is PE, however patient does  not have any chest pain. Clinical picture looks like it is due to infection. Mild hemoptysis is likely due to bronchiolectasis.   -will admit patient to telemetry bed  -Nebulizers: scheduled Atrovent and prn Xopenex Nebs - continue home spiriva  -Solu-Medrol 40 mg IV bid -Antibiotics: Rocephin and azithromycin -Mucinex for cough  -Incentive spirometry -Urine S. Pneumococcal and Legionella antigen -Follow up blood culture x2, sputum culture, respiratory virus panel, Flu pcr -Nasal cannula oxygen as needed to maintain O2 saturation 92% or greater -will get Procalcitonin and trend lactic acid levels per sepsis protocol. -IVF: 2L of NS bolus in ED, followed by 100 cc/h   Protein calorie malnutrition ([place]): -Nutrition supplement  GERD: -Protonix  Hypothyroidism: Last TSH was not on record -Continue home Synthroid -Check TSH  Hypokalemia: K= 3.0 on admission. - Repleted - Check Mg level  HTN:  -hold irbesartan due to hyponatremia -Continue amlodipine -IV hydralazine prn  Hyponatremia: Na 128. Mental status normal likely due to poor oral intake and dehydration, thyroid dysfunction - Will check urine sodium, urine osmolality, serum osmolality. - check TSH - IVF: as above - f/u by BMP  LBBB (left bundle branch block): This is a new issue. Patient does not have any chest pain, unlikely to have heart attack. Patient may have demand ischemia secondary to sepsis. -Troponin 3 -Patient is allergic to aspirin, will not give ASA -check A1c and FLP -repeat EKG in AM.  Depression and anxiety: Stable, no suicidal or homicidal ideations. -Continue home medications    DVT ppx: SCD Code Status: Full code Family Communication:  Yes, patient's  Daughter at bed side Disposition Plan:  Anticipate discharge back to previous home environment Consults called:  none Admission status:  Inpatient/tele      Date of Service 06/23/2017    Ivor Costa [facility] Pager [phone]  If  7PM-7AM, please contact night-coverage [email or web address] Password [email or web address] 06/23/2017, 3:21 AM

## 2017-06-23 NOTE — ED Notes (Addendum)
Sent add on label to main lab for bnp blood test

## 2017-06-23 NOTE — ED Provider Notes (Signed)
[facility] EMERGENCY DEPARTMENT Provider Note   CSN: [id number] Arrival date & time: 06/22/17  2334     History   Chief Complaint Chief Complaint  Patient presents with  . Shortness of Breath    HPI [name] is a 83 y.o. female.  Level 5 caveat for respiratory distress.  Patient presents from home by EMS with difficulty breathing, cough and congestion.  States she has been short of breath for the past 2 days with cough productive of clear and pink tinged mucus.  EMS states her initial oxygenation was 61% on room air.  He does not wear oxygen at home.  She does have a history of COPD.  She was given 10 mg of albuterol, Solu-Medrol and magnesium.  She still was hypoxic on arrival and was placed on nonrebreather.  She denies any chest pain.  She reports fever at home to 100.2.  Has had a hoarse voice, nasal congestion.  She denies any sick contacts.  No vomiting or diarrhea.  Did receive a flu shot.  No recent travel.  No history of CAD or CHF.   The history is provided by the patient and the EMS personnel. The history is limited by the condition of the patient.  Shortness of Breath  Associated symptoms include a fever, rhinorrhea and cough. Pertinent negatives include no headaches, no chest pain, no vomiting, no abdominal pain and no rash.    Past Medical History:  Diagnosis Date  . Bronchiectasis   . Hashimoto's thyroiditis   . Vocal cord paralysis     Patient Active Problem List   Diagnosis Date Noted  . Rhinitis, chronic 04/20/2014  . GERD (gastroesophageal reflux disease) 03/30/2013  . Protein calorie malnutrition ([place]) 11/28/2011  . HASHIMOTO'S THYROIDITIS 09/10/2007  . Vocal cord paresis 09/10/2007  . Pneumonia due to other specified bacteria ([place]) 09/10/2007  . BRONCHIECTASIS 09/10/2007    Past Surgical History:  Procedure Laterality Date  . APPENDECTOMY    . HEMORRHOID SURGERY    . hysteroscopy x 2    . THYROIDECTOMY    . TUBAL  LIGATION      OB History    No data available       Home Medications    Prior to Admission medications   Medication Sig Start Date End Date Taking? Authorizing Provider  acetaminophen-codeine (TYLENOL #3) 300-30 MG tablet Take 2 tablets by mouth every 6 (six) hours as needed for moderate pain. 02/28/17   Varney Biles, MD  ALPRAZolam Duanne Moron) 0.5 MG tablet Take 0.25 mg by mouth at bedtime.     [provider]  ALPRAZolam Duanne Moron) 0.5 MG tablet Take 0.25-0.5 mg by mouth 2 (two) times daily as needed for anxiety.    [provider]  amitriptyline (ELAVIL) 50 MG tablet Take 50 mg by mouth at bedtime.     [provider]  amLODipine (NORVASC) 5 MG tablet Take 5 mg by mouth daily.      [provider]  amoxicillin-clavulanate (AUGMENTIN) 875-125 MG tablet Take 1 tablet by mouth 2 (two) times daily. 06/22/17   Baird Lyons D, MD  Azelastine-Fluticasone (DYMISTA) 137-50 MCG/ACT SUSP Place 1-2 puffs into the nose at bedtime. Patient not taking: Reported on 02/28/2017 01/06/16 01/05/17  Baird Lyons D, MD  Azelastine-Fluticasone [facility]) [phone] MCG/ACT SUSP Place 1-2 sprays into the nose daily. Patient taking differently: Place 1-2 sprays into the nose daily as needed (for allergies).  08/02/16   Deneise Lever, MD  beclomethasone (  QVAR REDIHALER) 40 MCG/ACT inhaler Inhale 2 puffs 2 (two) times daily into the lungs. 05/23/17   Deneise Lever, MD  beclomethasone (QVAR) 40 MCG/ACT inhaler USE 2 INHALATIONS TWICE A DAY. RINSE MOUTH 05/22/17   Young, Tarri Fuller D, MD  benzonatate (TESSALON) 100 MG capsule Take 1 capsule (100 mg total) by mouth every 8 (eight) hours. 02/28/17   Varney Biles, MD  Calcium Carbonate-Vit D-Min (CALCIUM 1200 PO) Take 1 capsule by mouth every other day.     [provider]  celecoxib (CELEBREX) 200 MG capsule Take 1 capsule (200 mg total) by mouth 2 (two) times daily. 06/23/16   Marybelle Killings, MD  chlorpheniramine-HYDROcodone  (TUSSIONEX PENNKINETIC ER) 10-8 MG/5ML SUER Take 5 mLs by mouth every 12 (twelve) hours as needed for cough. 03/29/17   Baird Lyons D, MD  cholecalciferol (VITAMIN D) 1000 UNITS tablet Take 1,000 Units by mouth daily.    [provider]  Diphenhyd-Hydrocort-Nystatin (FIRST-DUKES MOUTHWASH) SUSP Use as directed 5 mLs in the mouth or throat 3 (three) times daily. SWISH AND SWALLOW 01/14/16   Young, Kasandra Knudsen, MD  Docusate Calcium (STOOL SOFTENER PO) Take 1 capsule by mouth at bedtime.    [provider]  furosemide (LASIX) 20 MG tablet Take 20 mg by mouth daily as needed (for swelling).    [provider]  Glucosamine-Chondroit-Vit C-Mn (GLUCOSAMINE CHONDR 1500 COMPLX) CAPS Take by mouth.    [provider]  guaiFENesin (MUCINEX) 600 MG 12 hr tablet Take 600 mg by mouth 2 (two) times daily as needed for cough or to loosen phlegm.    [provider]  HYDROcodone-acetaminophen (NORCO/VICODIN) 5-325 MG tablet Take 1 tablet by mouth every 12 (twelve) hours as needed for severe pain. 02/28/17   Varney Biles, MD  ipratropium (ATROVENT HFA) 17 MCG/ACT inhaler Inhale 2 puffs into the lungs every 6 (six) hours as needed for wheezing. 02/21/16   Deneise Lever, MD  levothyroxine (SYNTHROID, LEVOTHROID) 88 MCG tablet Take 88 mcg by mouth daily before breakfast.     [provider]  Multiple Vitamin (MULTIVITAMIN) tablet Take 1 tablet by mouth 3 (three) times a week.     [provider]  omeprazole (PRILOSEC) 20 MG capsule Take 1 capsule (20 mg total) by mouth daily. 02/01/17   Deneise Lever, MD  promethazine (PHENERGAN) 12.5 MG tablet Take 12.5 mg by mouth every 6 (six) hours as needed.    [provider]  Psyllium (METAMUCIL) 30.9 % POWD Take 15 mLs by mouth at bedtime.     [provider]  Spacer/Aero-Holding Chambers (AEROCHAMBER MV) inhaler Use as instructed 04/02/13   Deneise Lever, MD  SPIRIVA HANDIHALER 18 MCG inhalation  capsule INHALE THE CONTENTS OF 1 CAPSULE DAILY 04/16/17   Deneise Lever, MD  Throat Lozenges (COUGH DROPS MT) Use as directed 1 each in the mouth or throat as needed (for cough).     [provider]  valACYclovir (VALTREX) 1000 MG tablet Take 2 now and 2 in 12 hours for cold sores.     [provider]  valsartan-hydrochlorothiazide (DIOVAN-HCT) 320-25 MG per tablet Take 1 tablet by mouth daily.      [provider]    Family History Family History  Problem Relation Age of Onset  . COPD Father   . Hypertension Father     Social History Social History   Tobacco Use  . Smoking status: Never Smoker  Substance Use Topics  . Alcohol  use: Not on file  . Drug use: Not on file     Allergies   Asa [aspirin]; Boniva [ibandronic acid]; Cystex [germanium]; and Doxycycline   Review of Systems Review of Systems  Constitutional: Positive for activity change and fever. Negative for appetite change.  HENT: Positive for congestion and rhinorrhea.   Respiratory: Positive for cough and shortness of breath.   Cardiovascular: Negative for chest pain.  Gastrointestinal: Negative for abdominal pain, nausea and vomiting.  Genitourinary: Negative for dysuria, hematuria, vaginal bleeding and vaginal discharge.  Musculoskeletal: Negative for arthralgias and myalgias.  Skin: Negative for rash.  Neurological: Positive for dizziness, weakness and light-headedness. Negative for headaches.    all other systems are negative except as noted in the HPI and PMH.    Physical Exam Updated Vital Signs BP 132/66 (BP Location: Right Arm)   Pulse (!) 111   Temp 98.2 F (36.8 C) (Oral)   Resp 18   Ht 5\' 4"  (1.626 m)   Wt 45.4 kg (100 lb)   SpO2 95%   BMI 17.16 kg/m   Physical Exam  Constitutional: She is oriented to person, place, and time. She appears well-developed and well-nourished. She appears distressed.  Moderate respiratory distress, speaking short sentences  HENT:    Head: Normocephalic and atraumatic.  Mouth/Throat: Oropharynx is clear and moist. No oropharyngeal exudate.  hoarse voice (baseline per family)  Eyes: Conjunctivae and EOM are normal. Pupils are equal, round, and reactive to light.  Neck: Normal range of motion. Neck supple.  No meningismus.  Cardiovascular: Normal rate, normal heart sounds and intact distal pulses.  No murmur heard. Tachycardic 110s.  Pulmonary/Chest: She is in respiratory distress. She has wheezes.  Diminished breath sounds with faint expiratory wheezing in the bases.  Abdominal: Soft. There is no tenderness. There is no rebound and no guarding.  Musculoskeletal: Normal range of motion. She exhibits no edema or tenderness.  Neurological: She is alert and oriented to person, place, and time. No cranial nerve deficit. She exhibits normal muscle tone. Coordination normal.   5/5 strength throughout. CN 2-12 intact.Equal grip strength.   Skin: Skin is warm. Capillary refill takes less than 2 seconds.  Psychiatric: She has a normal mood and affect. Her behavior is normal.  Nursing note and vitals reviewed.    ED Treatments / Results  Labs (all labs ordered are listed, but only abnormal results are displayed) Labs Reviewed  CBC WITH DIFFERENTIAL/PLATELET - Abnormal; Notable for the following components:      Result Value   WBC 16.7 (*)    Neutro Abs 12.9 (*)    Monocytes Absolute 1.7 (*)    All other components within normal limits  BASIC METABOLIC PANEL - Abnormal; Notable for the following components:   Sodium 128 (*)    Potassium 3.0 (*)    Chloride 93 (*)    Glucose, Bld 162 (*)    Calcium 8.3 (*)    All other components within normal limits  BRAIN NATRIURETIC PEPTIDE - Abnormal; Notable for the following components:   B Natriuretic Peptide 183.0 (*)    All other components within normal limits  OSMOLALITY, URINE - Abnormal; Notable for the following components:   Osmolality, Ur 240 (*)    All other  components within normal limits  LACTIC ACID, PLASMA - Abnormal; Notable for the following components:   Lactic Acid, Venous 2.9 (*)    All other components within normal limits  LACTIC ACID, PLASMA - Abnormal; Notable for the  following components:   Lactic Acid, Venous 4.2 (*)    All other components within normal limits  PROTIME-INR - Abnormal; Notable for the following components:   Prothrombin Time 16.7 (*)    All other components within normal limits  APTT - Abnormal; Notable for the following components:   aPTT 42 (*)    All other components within normal limits  LIPID PANEL - Abnormal; Notable for the following components:   HDL 40 (*)    All other components within normal limits  RESPIRATORY PANEL BY PCR  CULTURE, BLOOD (ROUTINE X 2)  CULTURE, BLOOD (ROUTINE X 2)  CULTURE, EXPECTORATED SPUTUM-ASSESSMENT  GRAM STAIN  TROPONIN I  OSMOLALITY  SODIUM, URINE, RANDOM  TSH  MAGNESIUM  INFLUENZA PANEL BY PCR (TYPE A & B)  PROCALCITONIN  TROPONIN I  STREP PNEUMONIAE URINARY ANTIGEN  TROPONIN I  TROPONIN I  LEGIONELLA PNEUMOPHILA SEROGP 1 UR AG  HIV ANTIBODY (ROUTINE TESTING)  HEMOGLOBIN A1C  I-STAT ARTERIAL BLOOD GAS, ED    EKG  EKG Interpretation  Date/Time:  Saturday June 23 2017 00:22:54 EST Ventricular Rate:  106 PR Interval:    QRS Duration: 133 QT Interval:  378 QTC Calculation: 502 R Axis:   -61 Text Interpretation:  Sinus tachycardia Consider right atrial enlargement Left bundle branch block Baseline wander in lead(s) V1 V2 V3 V4 V5 V6 new LBBB Confirmed by Ezequiel Essex [phone]) on 06/23/2017 12:35:40 AM       Radiology Dg Chest Portable 1 View  Result Date: 06/23/2017 CLINICAL DATA:  Shortness of breath tonight. EXAM: PORTABLE CHEST 1 VIEW COMPARISON:  Radiograph 12/08/2016 FINDINGS: Heart size upper normal. Atherosclerosis of the aortic arch. Symmetric patchy suprahilar opacities are new from prior exam. Background diffuse chronic interstitial  thickening is stable. Again seen biapical pleuroparenchymal scarring. Possible blunting of both costophrenic angles. No pneumothorax. Remote right rib fracture. IMPRESSION: Symmetric patchy suprahilar opacities, new from prior exam, may be pulmonary edema, infectious or inflammatory. Possible small pleural effusions. Background interstitial thickening and underlying chronic lung disease again seen. Electronically Signed   By: Jeb Levering M.D.   On: 06/23/2017 01:20    Procedures Procedures (including critical care time)  Medications Ordered in ED Medications  albuterol (PROVENTIL,VENTOLIN) solution continuous neb (not administered)  ipratropium (ATROVENT) nebulizer solution 0.5 mg (not administered)     Initial Impression / Assessment and Plan / ED Course  I have reviewed the triage vital signs and the nursing notes.  Pertinent labs & imaging results that were available during my care of the patient were reviewed by me and considered in my medical decision making (see chart for details).    2 days a respiratory distress with cough.  Hypoxic on arrival.  Denies chest pain.    Patient given nebulizers and steroids on arrival.  Her EKG is nonischemic.  She denies chest pain.  Leukocytosis noted.  CXR with upper lobe edema versus infiltrate. Suspect pneumonia but cannot rule out CHF.  Pneumonia given patient's tachycardia, fever, tachypnea.  Lactate and white blood cell count are elevated.  She is given antibiotics for community acquired pneumonia.  CHF considered but does not appear clinically favored at this time. EKG with new left bundle branch block.  Patient with no chest pain.  Troponin negative. ABG without significant CO2 retention.  Patient stable on nasal cannula Feeling improved.  Admission discussed with Dr. Blaine Hamper.  CRITICAL CARE Performed by: Ezequiel Essex Total critical care time: 40 minutes Critical care time was exclusive of  separately billable procedures and  treating other patients. Critical care was necessary to treat or prevent imminent or life-threatening deterioration. Critical care was time spent personally by me on the following activities: development of treatment plan with patient and/or surrogate as well as nursing, discussions with consultants, evaluation of patient's response to treatment, examination of patient, obtaining history from patient or surrogate, ordering and performing treatments and interventions, ordering and review of laboratory studies, ordering and review of radiographic studies, pulse oximetry and re-evaluation of patient's condition.  Final Clinical Impressions(s) / ED Diagnoses   Final diagnoses:  Community acquired pneumonia, unspecified laterality  Acute respiratory failure with hypoxia [facility])    ED Discharge Orders    None       Ezequiel Essex, MD 06/23/17 [phone]

## 2017-06-23 NOTE — Progress Notes (Signed)
Pt requesting xanax. Ordered for 2 times daily. Last dose 0527. Pt states she takes q 8 hours at home. Paged MD. Awaiting call back

## 2017-06-23 NOTE — Progress Notes (Signed)
CRITICAL VALUE ALERT  Critical Value:  Lactic Acid 4.2  Date & Time Notied:  06/23/2017 7471  Provider Notified: Walden Field  Orders Received/Actions taken: No new orders at this time

## 2017-06-23 NOTE — Progress Notes (Signed)
MD on unit  MD aware of pt on 8 liters, pt does not wear home O2  MD aware of pt producing pink sputum  MD aware of pt lactic acid trending up MD at bedside now

## 2017-06-23 NOTE — ED Notes (Signed)
No addl blood draw,  Pt enroute to floor. 

## 2017-06-24 ENCOUNTER — Inpatient Hospital Stay (HOSPITAL_COMMUNITY): Payer: Medicare Other

## 2017-06-24 DIAGNOSIS — J189 Pneumonia, unspecified organism: Secondary | ICD-10-CM

## 2017-06-24 LAB — EXPECTORATED SPUTUM ASSESSMENT W REFEX TO RESP CULTURE

## 2017-06-24 LAB — MRSA PCR SCREENING: MRSA BY PCR: NEGATIVE

## 2017-06-24 LAB — CBC WITH DIFFERENTIAL/PLATELET
BASOS ABS: 0 10*3/uL (ref 0.0–0.1)
BASOS PCT: 0 %
BASOS PCT: 0 %
Basophils Absolute: 0 10*3/uL (ref 0.0–0.1)
EOS ABS: 0 10*3/uL (ref 0.0–0.7)
EOS ABS: 0 10*3/uL (ref 0.0–0.7)
Eosinophils Relative: 0 %
Eosinophils Relative: 0 %
HCT: 38 % (ref 36.0–46.0)
HCT: 39.2 % (ref 36.0–46.0)
HEMOGLOBIN: 13.1 g/dL (ref 12.0–15.0)
Hemoglobin: 12.9 g/dL (ref 12.0–15.0)
Lymphocytes Relative: 2 %
Lymphocytes Relative: 2 %
Lymphs Abs: 1 10*3/uL (ref 0.7–4.0)
Lymphs Abs: 0.9 10*3/uL (ref 0.7–4.0)
MCH: 29.3 pg (ref 26.0–34.0)
MCH: 29.1 pg (ref 26.0–34.0)
MCHC: 33.9 g/dL (ref 30.0–36.0)
MCHC: 33.4 g/dL (ref 30.0–36.0)
MCV: 86.2 fL (ref 78.0–100.0)
MCV: 87.1 fL (ref 78.0–100.0)
MONO ABS: 1.5 10*3/uL — AB (ref 0.1–1.0)
MONO ABS: 1.9 10*3/uL — AB (ref 0.1–1.0)
MONOS PCT: 3 %
Monocytes Relative: 3 %
NEUTROS PCT: 95 %
NEUTROS PCT: 95 %
Neutro Abs: 47.9 10*3/uL — ABNORMAL HIGH (ref 1.7–7.7)
Neutro Abs: 53.2 10*3/uL — ABNORMAL HIGH (ref 1.7–7.7)
PLATELETS: 395 10*3/uL (ref 150–400)
Platelets: 465 10*3/uL — ABNORMAL HIGH (ref 150–400)
RBC: 4.41 MIL/uL (ref 3.87–5.11)
RBC: 4.5 MIL/uL (ref 3.87–5.11)
RDW: 13.8 % (ref 11.5–15.5)
RDW: 13.8 % (ref 11.5–15.5)
WBC: 50.4 10*3/uL — AB (ref 4.0–10.5)
WBC: 56 10*3/uL (ref 4.0–10.5)

## 2017-06-24 LAB — BLOOD GAS, ARTERIAL
ACID-BASE EXCESS: 2.1 mmol/L — AB (ref 0.0–2.0)
Bicarbonate: 27.1 mmol/L (ref 20.0–28.0)
Drawn by: 213381
FIO2: 100
O2 SAT: 96.3 %
PCO2 ART: 49.7 mmHg — AB (ref 32.0–48.0)
Patient temperature: 98.6
pH, Arterial: 7.356 (ref 7.350–7.450)
pO2, Arterial: 88.1 mmHg (ref 83.0–108.0)

## 2017-06-24 LAB — BASIC METABOLIC PANEL
ANION GAP: 10 (ref 5–15)
BUN: 7 mg/dL (ref 6–20)
CALCIUM: 8.6 mg/dL — AB (ref 8.9–10.3)
CO2: 24 mmol/L (ref 22–32)
CREATININE: 0.45 mg/dL (ref 0.44–1.00)
Chloride: 102 mmol/L (ref 101–111)
Glucose, Bld: 184 mg/dL — ABNORMAL HIGH (ref 65–99)
Potassium: 4.2 mmol/L (ref 3.5–5.1)
SODIUM: 136 mmol/L (ref 135–145)

## 2017-06-24 LAB — EXPECTORATED SPUTUM ASSESSMENT W GRAM STAIN, RFLX TO RESP C

## 2017-06-24 LAB — LEGIONELLA PNEUMOPHILA SEROGP 1 UR AG: L. pneumophila Serogp 1 Ur Ag: NEGATIVE

## 2017-06-24 LAB — HEMOGLOBIN A1C
Hgb A1c MFr Bld: 6 % — ABNORMAL HIGH (ref 4.8–5.6)
Mean Plasma Glucose: 126 mg/dL

## 2017-06-24 MED ORDER — PIPERACILLIN-TAZOBACTAM 3.375 G IVPB 30 MIN
3.3750 g | Freq: Once | INTRAVENOUS | Status: AC
  Administered 2017-06-24: 3.375 g via INTRAVENOUS
  Filled 2017-06-24: qty 50

## 2017-06-24 MED ORDER — ENOXAPARIN SODIUM 30 MG/0.3ML ~~LOC~~ SOLN
30.0000 mg | SUBCUTANEOUS | Status: DC
  Administered 2017-06-24 – 2017-06-28 (×5): 30 mg via SUBCUTANEOUS
  Filled 2017-06-24 (×5): qty 0.3

## 2017-06-24 MED ORDER — GUAIFENESIN-DM 100-10 MG/5ML PO SYRP
5.0000 mL | ORAL_SOLUTION | ORAL | Status: DC | PRN
Start: 2017-06-24 — End: 2017-06-29
  Administered 2017-06-24 – 2017-06-26 (×3): 5 mL via ORAL
  Filled 2017-06-24 (×3): qty 5

## 2017-06-24 MED ORDER — FUROSEMIDE 10 MG/ML IJ SOLN
40.0000 mg | Freq: Once | INTRAMUSCULAR | Status: AC
  Administered 2017-06-24: 40 mg via INTRAVENOUS
  Filled 2017-06-24: qty 4

## 2017-06-24 MED ORDER — PIPERACILLIN-TAZOBACTAM 3.375 G IVPB
3.3750 g | Freq: Three times a day (TID) | INTRAVENOUS | Status: DC
  Administered 2017-06-25 – 2017-06-27 (×7): 3.375 g via INTRAVENOUS
  Filled 2017-06-24 (×10): qty 50

## 2017-06-24 MED ORDER — IOPAMIDOL (ISOVUE-370) INJECTION 76%
INTRAVENOUS | Status: AC
  Administered 2017-06-24: 50 mL via INTRAVENOUS
  Filled 2017-06-24: qty 50

## 2017-06-24 MED ORDER — METHYLPREDNISOLONE SODIUM SUCC 125 MG IJ SOLR
60.0000 mg | Freq: Four times a day (QID) | INTRAMUSCULAR | Status: DC
  Administered 2017-06-24 – 2017-06-26 (×7): 60 mg via INTRAVENOUS
  Filled 2017-06-24 (×7): qty 2

## 2017-06-24 NOTE — Progress Notes (Signed)
PROGRESS NOTE [facility]   [name]   DUK:[id number] DOB: June 02, 1931  DOA: 06/22/2017 PCP: Lujean Amel, MD   Brief Narrative:  [name] is a 83 y/o F with PMHx significant for COPD, bronchiectasis, vocal cord paralysis, HTN, GERD, hypothyroidism, depression/anxiety presented with cough, SOB and fever. In the ED patient was found to be hypoxic and CXR showing possible RLL pneumonia, patient was admitted for further treatment and evaluation.   Subjective: Patient seen and examined, she report doing better that yesterday, although  desats easy with movements. Cough slight improved. Remains afebrile   Assessment & Plan: Acute respiratory failure with hypoxia  Multifactorial CT with multifocal bronchopneumonia, bronchiectasis and COPD  No PE, ABG unremarkable  Significant increase in WBC - patient initially on Rocephin/Azithro will switch to Zosyn  Acute increase in WBC could be reactive rather than malignancy, diff shows mainly neutrophils, will send smear to pathology to review. If WBC does not improve with abx will discuss with heme.  Will increase solumedrol to 60 q 6 hrs for more aggressive antiinflammatory effect. Continue nebulizer treatment On exam lungs sound wet - ordered a dose of Lasix  Continue to monitor, wean O2 as tolerated  Strep Pnemo and respiratory panel negative - will order MRSA PCR   Hypothyroid  TSH normal  Continue synthroid  LBBB  New on EKG, Troponin negative x 3  ECHO pending  Unlikely to be MI or cause of hypoxia   HTN  BP stable  Continue home meds   Hypokalemia  K replete  Continue to monitor   Protein calorie malnutrition  Continue nutrition recommendations   HypoNa+  Resolved   DVT prophylaxis: Lovenox  Code Status: Full code  Family Communication: Daughter at bedside  Disposition Plan: Home when medically stable   Consultants:   None   Procedures:   None   Antimicrobials: Anti-infectives (From  admission, onward)   Start     Dose/Rate Route Frequency Ordered Stop   06/25/17 0000  piperacillin-tazobactam (ZOSYN) IVPB 3.375 g     3.375 g 12.5 mL/hr over 240 Minutes Intravenous Every 8 hours 06/24/17 1536     06/24/17 1630  piperacillin-tazobactam (ZOSYN) IVPB 3.375 g     3.375 g 100 mL/hr over 30 Minutes Intravenous  Once 06/24/17 1536     06/24/17 0000  azithromycin (ZITHROMAX) 500 mg in dextrose 5 % 250 mL IVPB  Status:  Discontinued     500 mg 250 mL/hr over 60 Minutes Intravenous Every 24 hours 06/23/17 0245 06/24/17 1530   06/23/17 2200  cefTRIAXone (ROCEPHIN) 1 g in dextrose 5 % 50 mL IVPB  Status:  Discontinued     1 g 100 mL/hr over 30 Minutes Intravenous Every 24 hours 06/23/17 0245 06/24/17 1530   06/23/17 0030  cefTRIAXone (ROCEPHIN) 1 g in dextrose 5 % 50 mL IVPB     1 g 100 mL/hr over 30 Minutes Intravenous  Once 06/23/17 0025 06/23/17 0122   06/23/17 0030  azithromycin (ZITHROMAX) 500 mg in dextrose 5 % 250 mL IVPB     500 mg 250 mL/hr over 60 Minutes Intravenous  Once 06/23/17 0025 06/23/17 0255           Objective: Vitals:   06/23/17 1936 06/24/17 0458 06/24/17 0533 06/24/17 0724  BP: 137/67 138/68    Pulse: (!) 102 (!) 110    Resp: 18 18    Temp: 98.4 F (36.9 C) 98 F (36.7 C)    TempSrc: Oral Oral  SpO2: 91% 92% 93% 91%  Weight:  44.2 kg (97 lb 7.1 oz)    Height:        Intake/Output Summary (Last 24 hours) at 06/24/2017 1007 Last data filed at 06/24/2017 0953 Gross per 24 hour  Intake 1432 ml  Output 1101 ml  Net 331 ml   Filed Weights   06/22/17 2346 06/23/17 0430 06/24/17 0458  Weight: 45.4 kg (100 lb) 44.1 kg (97 lb 4.8 oz) 44.2 kg (97 lb 7.1 oz)    Examination:  General exam: Frail elderly, on O2 [place] @ 6L  HEENT: OP moist and clear, voice hoarse  Respiratory system: decrease air entry diffuse bronchial sounds and bibasilar crackles, no wheezing  Cardiovascular system: S1 & S2 heard, RRR. No murmurs  Gastrointestinal system:  Abdomen is nondistended, soft and nontender. Normal bowel sounds heard. Central nervous system: Alert and oriented. No focal neurological deficits. Extremities: No pedal edema.  Skin: No rashes, lesions or ulcers Psychiatry:  Mood & affect appropriate.   Data Reviewed: I have personally reviewed following labs and imaging studies  CBC: Recent Labs  Lab 06/23/17 0004  WBC 16.7*  NEUTROABS 12.9*  HGB 12.5  HCT 36.9  MCV 84.4  PLT 993   Basic Metabolic Panel: Recent Labs  Lab 06/23/17 0004 06/23/17 0240  NA 128*  --   K 3.0*  --   CL 93*  --   CO2 23  --   GLUCOSE 162*  --   BUN 9  --   CREATININE 0.74  --   CALCIUM 8.3*  --   MG  --  2.3   GFR: Estimated Creatinine Clearance: 36.5 mL/min (by C-G formula based on SCr of 0.74 mg/dL). Liver Function Tests: No results for input(s): AST, ALT, ALKPHOS, BILITOT, PROT, ALBUMIN in the last 168 hours. No results for input(s): LIPASE, AMYLASE in the last 168 hours. No results for input(s): AMMONIA in the last 168 hours. Coagulation Profile: Recent Labs  Lab 06/23/17 0240  INR 1.36   Cardiac Enzymes: Recent Labs  Lab 06/23/17 0004 06/23/17 0240 06/23/17 0823 06/23/17 1419  TROPONINI <0.03 <0.03 <0.03 <0.03   BNP (last 3 results) No results for input(s): PROBNP in the last 8760 hours. HbA1C: No results for input(s): HGBA1C in the last 72 hours. CBG: No results for input(s): GLUCAP in the last 168 hours. Lipid Profile: Recent Labs    06/23/17 0528  CHOL 79  HDL 40*  LDLCALC 31  TRIG 39  CHOLHDL 2.0   Thyroid Function Tests: Recent Labs    06/23/17 0432  TSH 0.443   Anemia Panel: No results for input(s): VITAMINB12, FOLATE, FERRITIN, TIBC, IRON, RETICCTPCT in the last 72 hours. Sepsis Labs: Recent Labs  Lab 06/23/17 0240 06/23/17 0243 06/23/17 0432 06/23/17 0823  PROCALCITON 0.12  --   --   --   LATICACIDVEN  --  2.9* 4.2* 3.7*    Recent Results (from the past 240 hour(s))  Respiratory Panel  by PCR     Status: None   Collection Time: 06/23/17  2:42 AM  Result Value Ref Range Status   Adenovirus NOT DETECTED NOT DETECTED Final   Coronavirus 229E NOT DETECTED NOT DETECTED Final   Coronavirus HKU1 NOT DETECTED NOT DETECTED Final   Coronavirus NL63 NOT DETECTED NOT DETECTED Final   Coronavirus OC43 NOT DETECTED NOT DETECTED Final   Metapneumovirus NOT DETECTED NOT DETECTED Final   Rhinovirus / Enterovirus NOT DETECTED NOT DETECTED Final   Influenza A NOT  DETECTED NOT DETECTED Final   Influenza B NOT DETECTED NOT DETECTED Final   Parainfluenza Virus 1 NOT DETECTED NOT DETECTED Final   Parainfluenza Virus 2 NOT DETECTED NOT DETECTED Final   Parainfluenza Virus 3 NOT DETECTED NOT DETECTED Final   Parainfluenza Virus 4 NOT DETECTED NOT DETECTED Final   Respiratory Syncytial Virus NOT DETECTED NOT DETECTED Final   Bordetella pertussis NOT DETECTED NOT DETECTED Final   Chlamydophila pneumoniae NOT DETECTED NOT DETECTED Final   Mycoplasma pneumoniae NOT DETECTED NOT DETECTED Final      Radiology Studies: Dg Chest Portable 1 View  Result Date: 06/23/2017 CLINICAL DATA:  Shortness of breath tonight. EXAM: PORTABLE CHEST 1 VIEW COMPARISON:  Radiograph 12/08/2016 FINDINGS: Heart size upper normal. Atherosclerosis of the aortic arch. Symmetric patchy suprahilar opacities are new from prior exam. Background diffuse chronic interstitial thickening is stable. Again seen biapical pleuroparenchymal scarring. Possible blunting of both costophrenic angles. No pneumothorax. Remote right rib fracture. IMPRESSION: Symmetric patchy suprahilar opacities, new from prior exam, may be pulmonary edema, infectious or inflammatory. Possible small pleural effusions. Background interstitial thickening and underlying chronic lung disease again seen. Electronically Signed   By: Jeb Levering M.D.   On: 06/23/2017 01:20     Scheduled Meds: . amitriptyline  50 mg Oral QHS  . amLODipine  5 mg Oral QPM  .  arformoterol  15 mcg Nebulization BID  . azelastine  1-2 spray Each Nare Daily   And  . fluticasone  1-2 spray Each Nare Daily  . budesonide (PULMICORT) nebulizer solution  0.5 mg Nebulization BID  . calcium-vitamin D  2 tablet Oral QODAY  . celecoxib  200 mg Oral BID  . cholecalciferol  1,000 Units Oral Daily  . feeding supplement (ENSURE ENLIVE)  237 mL Oral BID BM  . ipratropium  0.5 mg Nebulization TID  . levothyroxine  88 mcg Oral QAC breakfast  . methylPREDNISolone (SOLU-MEDROL) injection  40 mg Intravenous Q12H  . [START ON 06/25/2017] multivitamin with minerals  1 tablet Oral Q M,W,F  . pantoprazole  40 mg Oral Daily  . tiotropium  1 capsule Inhalation Daily   Continuous Infusions: . azithromycin Stopped (06/24/17 0130)  . cefTRIAXone (ROCEPHIN)  IV Stopped (06/23/17 2309)     LOS: 1 day    Time spent: Total of 35 minutes spent with pt, greater than 50% of which was spent in discussion of  treatment, counseling and coordination of care    Chipper Oman, MD Pager: Text Page via [email or web address]   If 7PM-7AM, please contact night-coverage [email or web address] 06/24/2017, 10:07 AM

## 2017-06-24 NOTE — Progress Notes (Signed)
CRITICAL VALUE ALERT  Critical Value:  WBC 50.4  Date & Time Notied:  06/24/17 at 1038  Provider Notified: Dr. Quincy Simmonds  Orders Received/Actions taken: Repeat CBC

## 2017-06-24 NOTE — Plan of Care (Signed)
  Progressing Safety: Ability to remain free from injury will improve 06/24/2017 0257 - Progressing by Ardine Eng, RN

## 2017-06-24 NOTE — Progress Notes (Signed)
Asked Dr. Quincy Simmonds to consider transferring pt to stepdown, as pt desats with very minimal movement to low 70s.  ABGs ordered.  CT angio ordered.  Pt on 8L non-rebreather. Pt not on O2 at home.  WBC drawn x2 = 50.4, 56.0.  Attending aware.  No orders received r/t transfer.

## 2017-06-24 NOTE — Progress Notes (Signed)
Patient continues to desaturate at the least amount of activity.  02 drops to upper 70's when rolling in the bed.  On call hospitalist made aware

## 2017-06-24 NOTE — Progress Notes (Signed)
Pharmacy Antibiotic Note  [name] is a 83 y.o. female  with pneumonia.  Pharmacy has been consulted for zosyn dosing. -WBC= 56, afeb, SCr= 0.45, CrCl ~ 45  Plan: -Zosyn 3.375gm IV q8h -Will follow renal function, cultures and clinical progress   Height: 5\' 4"  (162.6 cm) Weight: 97 lb 7.1 oz (44.2 kg) IBW/kg (Calculated) : 54.7  Temp (24hrs), Avg:98.2 F (36.8 C), Min:98 F (36.7 C), Max:98.4 F (36.9 C)  Recent Labs  Lab 06/23/17 0004 06/23/17 0243 06/23/17 0432 06/23/17 0823 06/24/17 0909 06/24/17 1048  WBC 16.7*  --   --   --  50.4* 56.0*  CREATININE 0.74  --   --   --  0.45  --   LATICACIDVEN  --  2.9* 4.2* 3.7*  --   --     Estimated Creatinine Clearance: 36.5 mL/min (by C-G formula based on SCr of 0.45 mg/dL).    Allergies  Allergen Reactions  . Asa [Aspirin] Other (See Comments)    Spits up blood  . Boniva [Ibandronic Acid] Other (See Comments)    GI upset  . Cystex [Germanium] Nausea Only  . Doxycycline Nausea Only and Hypertension    Antimicrobials this admission: 12/16 zosyn>>  Dose adjustments this admission: 12/15 blood x2 12/15 resp viral panel  Microbiology results:  Thank you for allowing pharmacy to be a part of this patient's care.  Hildred Laser, Pharm D 06/24/2017 3:33 PM

## 2017-06-24 NOTE — Plan of Care (Signed)
  Clinical Measurements: Respiratory complications will improve 06/24/2017 0848 - Not Progressing by Imagene Gurney, RN

## 2017-06-24 NOTE — Progress Notes (Signed)
Pt placed on NRB as SpO2 drops rapidly with minimal exertion, pt having difficulty keeping HFNC in nose.  Increased RR and WOB.  PRN neb given.  Chest CT pending.  Sputum container at beside to collect sample.  Pt and family member present aware of need to collect.  Will try for collection post nebs as well.

## 2017-06-25 ENCOUNTER — Inpatient Hospital Stay (HOSPITAL_COMMUNITY): Payer: Medicare Other

## 2017-06-25 DIAGNOSIS — J9621 Acute and chronic respiratory failure with hypoxia: Secondary | ICD-10-CM

## 2017-06-25 LAB — CBC WITH DIFFERENTIAL/PLATELET
BASOS ABS: 0 10*3/uL (ref 0.0–0.1)
Basophils Relative: 0 %
EOS PCT: 0 %
Eosinophils Absolute: 0 10*3/uL (ref 0.0–0.7)
HEMATOCRIT: 41.9 % (ref 36.0–46.0)
Hemoglobin: 13.8 g/dL (ref 12.0–15.0)
LYMPHS ABS: 0.5 10*3/uL — AB (ref 0.7–4.0)
Lymphocytes Relative: 1 %
MCH: 28.9 pg (ref 26.0–34.0)
MCHC: 32.9 g/dL (ref 30.0–36.0)
MCV: 87.7 fL (ref 78.0–100.0)
MONO ABS: 1.4 10*3/uL — AB (ref 0.1–1.0)
MONOS PCT: 3 %
NEUTROS ABS: 45.5 10*3/uL — AB (ref 1.7–7.7)
Neutrophils Relative %: 96 %
PLATELETS: 461 10*3/uL — AB (ref 150–400)
RBC: 4.78 MIL/uL (ref 3.87–5.11)
RDW: 13.9 % (ref 11.5–15.5)
WBC: 47.4 10*3/uL — AB (ref 4.0–10.5)

## 2017-06-25 LAB — SAVE SMEAR

## 2017-06-25 LAB — COMPREHENSIVE METABOLIC PANEL
ALBUMIN: 3 g/dL — AB (ref 3.5–5.0)
ALK PHOS: 134 U/L — AB (ref 38–126)
ALT: 52 U/L (ref 14–54)
AST: 64 U/L — AB (ref 15–41)
Anion gap: 11 (ref 5–15)
BILIRUBIN TOTAL: 0.3 mg/dL (ref 0.3–1.2)
BUN: 11 mg/dL (ref 6–20)
CO2: 33 mmol/L — ABNORMAL HIGH (ref 22–32)
CREATININE: 0.54 mg/dL (ref 0.44–1.00)
Calcium: 8.7 mg/dL — ABNORMAL LOW (ref 8.9–10.3)
Chloride: 92 mmol/L — ABNORMAL LOW (ref 101–111)
GFR calc Af Amer: >60 mL/min (ref >60)
GLUCOSE: 175 mg/dL — AB (ref 65–99)
POTASSIUM: 3.7 mmol/L (ref 3.5–5.1)
Sodium: 136 mmol/L (ref 135–145)
TOTAL PROTEIN: 7 g/dL (ref 6.5–8.1)

## 2017-06-25 LAB — MAGNESIUM: Magnesium: 1.8 mg/dL (ref 1.7–2.4)

## 2017-06-25 MED ORDER — POTASSIUM CHLORIDE CRYS ER 20 MEQ PO TBCR
40.0000 meq | EXTENDED_RELEASE_TABLET | Freq: Once | ORAL | Status: AC
  Administered 2017-06-25: 40 meq via ORAL
  Filled 2017-06-25: qty 2

## 2017-06-25 MED ORDER — FUROSEMIDE 10 MG/ML IJ SOLN
40.0000 mg | Freq: Once | INTRAMUSCULAR | Status: AC
  Administered 2017-06-25: 40 mg via INTRAVENOUS
  Filled 2017-06-25: qty 4

## 2017-06-25 NOTE — Progress Notes (Addendum)
CCMD called pt HR sustain in 130s,  Pt asymptomatic on assessment, MD paged, ordered stat EKG. EKG done , copy in pt chart

## 2017-06-25 NOTE — Progress Notes (Signed)
Pt is alert and verbal, up in bed eating dinner, 02 sat 97% on 5 L. No sign of distress. Daughter at bedside.

## 2017-06-25 NOTE — Progress Notes (Signed)
MD changed pt nonrebreather to [place] at 5 L/min, pt tolerating well, saturating at 95%.

## 2017-06-25 NOTE — Progress Notes (Signed)
PROGRESS NOTE [facility]   [name]   GGY:[id number] DOB: [birth year]  DOA: 06/22/2017 PCP: Lujean Amel, MD   Brief Narrative:  [name] is a 83 y/o F with PMHx significant for COPD, bronchiectasis, vocal cord paralysis, HTN, GERD, hypothyroidism, depression/anxiety presented with cough, SOB and fever. In the ED patient was found to be hypoxic and CXR showing possible RLL pneumonia, patient was admitted for further treatment and evaluation.   Subjective: Patient seen and examined feel much better than yesterday although still SOB. Overnight 1 dose of Lasix   Assessment & Plan: Acute respiratory failure with hypoxia - slight improved today  Multifactorial CT with multifocal bronchopneumonia, bronchiectasis and COPD  No PE, ABG unremarkable  Patient initially on Rocephin/Azithro switched to Zosyn will continue for now   WBC continues to be significantly elevated, predominant neutrophils, awaiting for path to review smear, pending review will discuss with heme   Continue solumedrol  Continue nebulizer treatment Continue to monitor, wean O2 as tolerated  Strep Pnemo, legionella, MRSA PCR and respiratory panel negative   Hypothyroid  TSH normal  Continue synthroid  LBBB  New on EKG, Troponin negative x 3 - no past records available  ECHO pending  Unlikely to be MI the cause of hypoxia   HTN  BP stable  Continue home meds   Hypokalemia - improved  Replete PRN  Check BMP   Protein calorie malnutrition  Continue nutrition recommendations   HypoNa+  Resolved   DVT prophylaxis: Lovenox  Code Status: Full code  Family Communication: Daughter at bedside  Disposition Plan: Home when medically stable   Consultants:   None   Procedures:   None   Antimicrobials: Anti-infectives (From admission, onward)   Start     Dose/Rate Route Frequency Ordered Stop   06/25/17 0000  piperacillin-tazobactam (ZOSYN) IVPB 3.375 g     3.375 g 12.5 mL/hr over  240 Minutes Intravenous Every 8 hours 06/24/17 1536     06/24/17 1630  piperacillin-tazobactam (ZOSYN) IVPB 3.375 g     3.375 g 100 mL/hr over 30 Minutes Intravenous  Once 06/24/17 1536 06/24/17 1800   06/24/17 0000  azithromycin (ZITHROMAX) 500 mg in dextrose 5 % 250 mL IVPB  Status:  Discontinued     500 mg 250 mL/hr over 60 Minutes Intravenous Every 24 hours 06/23/17 0245 06/24/17 1530   06/23/17 2200  cefTRIAXone (ROCEPHIN) 1 g in dextrose 5 % 50 mL IVPB  Status:  Discontinued     1 g 100 mL/hr over 30 Minutes Intravenous Every 24 hours 06/23/17 0245 06/24/17 1530   06/23/17 0030  cefTRIAXone (ROCEPHIN) 1 g in dextrose 5 % 50 mL IVPB     1 g 100 mL/hr over 30 Minutes Intravenous  Once 06/23/17 0025 06/23/17 0122   06/23/17 0030  azithromycin (ZITHROMAX) 500 mg in dextrose 5 % 250 mL IVPB     500 mg 250 mL/hr over 60 Minutes Intravenous  Once 06/23/17 0025 06/23/17 0255          Objective: Vitals:   06/24/17 2038 06/25/17 0000 06/25/17 0435 06/25/17 0931  BP:  137/70 140/71   Pulse:  (!) 110 (!) 114   Resp:  (!) 24 20   Temp:  97.7 F (36.5 C) (!) 97.1 F (36.2 C)   TempSrc:  Oral Oral   SpO2: 94% 99% 99% 97%  Weight:   43.1 kg (95 lb 0.3 oz)   Height:        Intake/Output  Summary (Last 24 hours) at 06/25/2017 1400 Last data filed at 06/25/2017 1256 Gross per 24 hour  Intake 540 ml  Output 2050 ml  Net -1510 ml   Filed Weights   06/23/17 0430 06/24/17 0458 06/25/17 0435  Weight: 44.1 kg (97 lb 4.8 oz) 44.2 kg (97 lb 7.1 oz) 43.1 kg (95 lb 0.3 oz)    Examination:  General: NAD, on O2 5L [place], voice hoarse  Cardiovascular: S1S2 tachycardia 110 RRR Respiratory: Air entry slightly improved, diffuse rhonchi and mild expiratory wheezing  Abdominal: Soft, NT, ND, bowel sounds + Extremities: no edema  Data Reviewed: I have personally reviewed following labs and imaging studies  CBC: Recent Labs  Lab 06/23/17 0004 06/24/17 0909 06/24/17 1048 06/25/17 0441    WBC 16.7* 50.4* 56.0* 47.4*  NEUTROABS 12.9* 47.9* 53.2* 45.5*  HGB 12.5 12.9 13.1 13.8  HCT 36.9 38.0 39.2 41.9  MCV 84.4 86.2 87.1 87.7  PLT 378 395 465* 413*   Basic Metabolic Panel: Recent Labs  Lab 06/23/17 0004 06/23/17 0240 06/24/17 0909 06/25/17 0441  NA 128*  --  136 136  K 3.0*  --  4.2 3.7  CL 93*  --  102 92*  CO2 23  --  24 33*  GLUCOSE 162*  --  184* 175*  BUN 9  --  7 11  CREATININE 0.74  --  0.45 0.54  CALCIUM 8.3*  --  8.6* 8.7*  MG  --  2.3  --  1.8   GFR: Estimated Creatinine Clearance: 35.6 mL/min (by C-G formula based on SCr of 0.54 mg/dL). Liver Function Tests: Recent Labs  Lab 06/25/17 0441  AST 64*  ALT 52  ALKPHOS 134*  BILITOT 0.3  PROT 7.0  ALBUMIN 3.0*   No results for input(s): LIPASE, AMYLASE in the last 168 hours. No results for input(s): AMMONIA in the last 168 hours. Coagulation Profile: Recent Labs  Lab 06/23/17 0240  INR 1.36   Cardiac Enzymes: Recent Labs  Lab 06/23/17 0004 06/23/17 0240 06/23/17 0823 06/23/17 1419  TROPONINI <0.03 <0.03 <0.03 <0.03   BNP (last 3 results) No results for input(s): PROBNP in the last 8760 hours. HbA1C: Recent Labs    06/23/17 0528  HGBA1C 6.0*   CBG: No results for input(s): GLUCAP in the last 168 hours. Lipid Profile: Recent Labs    06/23/17 0528  CHOL 79  HDL 40*  LDLCALC 31  TRIG 39  CHOLHDL 2.0   Thyroid Function Tests: Recent Labs    06/23/17 0432  TSH 0.443   Anemia Panel: No results for input(s): VITAMINB12, FOLATE, FERRITIN, TIBC, IRON, RETICCTPCT in the last 72 hours. Sepsis Labs: Recent Labs  Lab 06/23/17 0240 06/23/17 0243 06/23/17 0432 06/23/17 0823  PROCALCITON 0.12  --   --   --   LATICACIDVEN  --  2.9* 4.2* 3.7*    Recent Results (from the past 240 hour(s))  Respiratory Panel by PCR     Status: None   Collection Time: 06/23/17  2:42 AM  Result Value Ref Range Status   Adenovirus NOT DETECTED NOT DETECTED Final   Coronavirus 229E NOT  DETECTED NOT DETECTED Final   Coronavirus HKU1 NOT DETECTED NOT DETECTED Final   Coronavirus NL63 NOT DETECTED NOT DETECTED Final   Coronavirus OC43 NOT DETECTED NOT DETECTED Final   Metapneumovirus NOT DETECTED NOT DETECTED Final   Rhinovirus / Enterovirus NOT DETECTED NOT DETECTED Final   Influenza A NOT DETECTED NOT DETECTED Final   Influenza B  NOT DETECTED NOT DETECTED Final   Parainfluenza Virus 1 NOT DETECTED NOT DETECTED Final   Parainfluenza Virus 2 NOT DETECTED NOT DETECTED Final   Parainfluenza Virus 3 NOT DETECTED NOT DETECTED Final   Parainfluenza Virus 4 NOT DETECTED NOT DETECTED Final   Respiratory Syncytial Virus NOT DETECTED NOT DETECTED Final   Bordetella pertussis NOT DETECTED NOT DETECTED Final   Chlamydophila pneumoniae NOT DETECTED NOT DETECTED Final   Mycoplasma pneumoniae NOT DETECTED NOT DETECTED Final  Culture, blood (x 2)     Status: None (Preliminary result)   Collection Time: 06/23/17  4:32 AM  Result Value Ref Range Status   Specimen Description BLOOD RIGHT ANTECUBITAL  Final   Special Requests IN PEDIATRIC BOTTLE Blood Culture adequate volume  Final   Culture NO GROWTH 1 DAY  Final   Report Status PENDING  Incomplete  Culture, blood (x 2)     Status: None (Preliminary result)   Collection Time: 06/23/17  4:32 AM  Result Value Ref Range Status   Specimen Description BLOOD  Final   Special Requests AEROBIC BOTTLE ONLY Blood Culture adequate volume  Final   Culture NO GROWTH 1 DAY  Final   Report Status PENDING  Incomplete  Culture, sputum-assessment     Status: None   Collection Time: 06/23/17  7:15 PM  Result Value Ref Range Status   Specimen Description EXPECTORATED SPUTUM  Final   Special Requests NONE  Final   Sputum evaluation THIS SPECIMEN IS ACCEPTABLE FOR SPUTUM CULTURE  Final   Report Status 06/24/2017 FINAL  Final  Culture, respiratory (NON-Expectorated)     Status: None (Preliminary result)   Collection Time: 06/23/17  7:15 PM  Result  Value Ref Range Status   Specimen Description EXPECTORATED SPUTUM  Final   Special Requests NONE Reflexed from [id number]  Final   Gram Stain   Final    FEW WBC PRESENT, PREDOMINANTLY PMN RARE SQUAMOUS EPITHELIAL CELLS PRESENT RARE GRAM POSITIVE COCCI    Culture TOO YOUNG TO READ  Final   Report Status PENDING  Incomplete  MRSA PCR Screening     Status: None   Collection Time: 06/24/17  3:53 PM  Result Value Ref Range Status   MRSA by PCR NEGATIVE NEGATIVE Final    Comment:        The GeneXpert MRSA Assay (FDA approved for NASAL specimens only), is one component of a comprehensive MRSA colonization surveillance program. It is not intended to diagnose MRSA infection nor to guide or monitor treatment for MRSA infections.      Radiology Studies: Ct Angio Chest Pe W Or Wo Contrast  Result Date: 06/24/2017 CLINICAL DATA:  Shortness of breath. EXAM: CT ANGIOGRAPHY CHEST WITH CONTRAST TECHNIQUE: Multidetector CT imaging of the chest was performed using the standard protocol during bolus administration of intravenous contrast. Multiplanar CT image reconstructions and MIPs were obtained to evaluate the vascular anatomy. CONTRAST:  58mL ISOVUE-370 IOPAMIDOL (ISOVUE-370) INJECTION 76% COMPARISON:  None. FINDINGS: Cardiovascular: Mild cardiac enlargement. Aortic atherosclerosis. The main pulmonary artery is patent. No saddle embolus. No suspicious lobar or segmental pulmonary artery filling defects identified. Mediastinum/Nodes: The trachea appears patent and is midline. Normal appearance of the esophagus. No mediastinal, hilar, axillary or supraclavicular adenopathy. Lungs/Pleura: Small to moderate bilateral pleural effusions. Bronchiectasis is identified bilaterally. There are also large bilateral upper lobe predominant areas of ground-glass attenuation and with interlobular septal thickening. Upper Abdomen: No acute abnormality. Musculoskeletal: No chest wall abnormality. No acute or significant  osseous findings.  Review of the MIP images confirms the above findings. IMPRESSION: 1. No evidence for acute pulmonary embolus. 2. Bilateral areas of bronchiectasis. 3. Bilateral multifocal areas of ground-glass attenuation and interlobular septal thickening is noted. The appearance is nonspecific but in the acute setting may represent multifocal bronchopneumonia. Other inflammatory and infectious etiologies are not excluded. Suggest followup imaging to ensure resolution. In the absence of interval clearing a high-resolution CT of the chest would be advised. 4.  Aortic Atherosclerosis (ICD10-I70.0). Electronically Signed   By: Kerby Moors M.D.   On: 06/24/2017 14:52    Scheduled Meds: . amitriptyline  50 mg Oral QHS  . amLODipine  5 mg Oral QPM  . arformoterol  15 mcg Nebulization BID  . azelastine  1-2 spray Each Nare Daily   And  . fluticasone  1-2 spray Each Nare Daily  . budesonide (PULMICORT) nebulizer solution  0.5 mg Nebulization BID  . calcium-vitamin D  2 tablet Oral QODAY  . celecoxib  200 mg Oral BID  . cholecalciferol  1,000 Units Oral Daily  . enoxaparin (LOVENOX) injection  30 mg Subcutaneous Q24H  . feeding supplement (ENSURE ENLIVE)  237 mL Oral BID BM  . ipratropium  0.5 mg Nebulization TID  . levothyroxine  88 mcg Oral QAC breakfast  . methylPREDNISolone (SOLU-MEDROL) injection  60 mg Intravenous Q6H  . multivitamin with minerals  1 tablet Oral Q M,W,F  . pantoprazole  40 mg Oral Daily  . tiotropium  1 capsule Inhalation Daily   Continuous Infusions: . piperacillin-tazobactam (ZOSYN)  IV 3.375 g (06/25/17 1059)     LOS: 2 days    Time spent: Total of 35 minutes spent with pt, greater than 50% of which was spent in discussion of  treatment, counseling and coordination of care   Chipper Oman, MD Pager: Text Page via [email or web address]   If 7PM-7AM, please contact night-coverage [email or web address] 06/25/2017, 2:00 PM

## 2017-06-25 NOTE — Progress Notes (Signed)
  Echocardiogram 2D Echocardiogram has been performed.  [name] 06/25/2017, 5:11 PM

## 2017-06-25 NOTE — Plan of Care (Signed)
  Progressing Clinical Measurements: Ability to maintain clinical measurements within normal limits will improve 06/25/2017 0514 - Progressing by Ruben Im, RN Will remain free from infection 06/25/2017 0514 - Progressing by Ruben Im, RN Diagnostic test results will improve 06/25/2017 0514 - Progressing by Ruben Im, RN Respiratory complications will improve 06/25/2017 0514 - Progressing by Ruben Im, RN Cardiovascular complication will be avoided 06/25/2017 0514 - Progressing by Ruben Im, RN Activity: Risk for activity intolerance will decrease 06/25/2017 0514 - Progressing by Ruben Im, RN Nutrition: Adequate nutrition will be maintained 06/25/2017 0514 - Progressing by Ruben Im, RN Coping: Level of anxiety will decrease 06/25/2017 0514 - Progressing by Ruben Im, RN Elimination: Will not experience complications related to bowel motility 06/25/2017 0514 - Progressing by Ruben Im, RN Will not experience complications related to urinary retention 06/25/2017 0514 - Progressing by Ruben Im, RN Safety: Ability to remain free from injury will improve 06/25/2017 0514 - Progressing by Ruben Im, RN Skin Integrity: Risk for impaired skin integrity will decrease 06/25/2017 0514 - Progressing by Ruben Im, RN

## 2017-06-25 NOTE — Progress Notes (Signed)
Pt still having coarse crackles throughout, MD notified, ordered Lasix IV 1 x dose 40 mg, will continue to monitor, thanks [place].

## 2017-06-26 LAB — BASIC METABOLIC PANEL
ANION GAP: 11 (ref 5–15)
BUN: 15 mg/dL (ref 6–20)
CHLORIDE: 91 mmol/L — AB (ref 101–111)
CO2: 33 mmol/L — AB (ref 22–32)
Calcium: 8.5 mg/dL — ABNORMAL LOW (ref 8.9–10.3)
Creatinine, Ser: 0.52 mg/dL (ref 0.44–1.00)
GFR calc non Af Amer: >60 mL/min (ref >60)
GLUCOSE: 164 mg/dL — AB (ref 65–99)
POTASSIUM: 5.7 mmol/L — AB (ref 3.5–5.1)
Sodium: 135 mmol/L (ref 135–145)

## 2017-06-26 LAB — CBC WITH DIFFERENTIAL/PLATELET
Basophils Absolute: 0 10*3/uL (ref 0.0–0.1)
Basophils Relative: 0 %
EOS PCT: 0 %
Eosinophils Absolute: 0 10*3/uL (ref 0.0–0.7)
HEMATOCRIT: 41.1 % (ref 36.0–46.0)
HEMOGLOBIN: 13.4 g/dL (ref 12.0–15.0)
LYMPHS ABS: 0.9 10*3/uL (ref 0.7–4.0)
LYMPHS PCT: 3 %
MCH: 28.3 pg (ref 26.0–34.0)
MCHC: 32.6 g/dL (ref 30.0–36.0)
MCV: 86.9 fL (ref 78.0–100.0)
MONOS PCT: 3 %
Monocytes Absolute: 0.9 10*3/uL (ref 0.1–1.0)
NEUTROS PCT: 94 %
Neutro Abs: 28.4 10*3/uL — ABNORMAL HIGH (ref 1.7–7.7)
Platelets: 443 10*3/uL — ABNORMAL HIGH (ref 150–400)
RBC: 4.73 MIL/uL (ref 3.87–5.11)
RDW: 13.9 % (ref 11.5–15.5)
WBC: 30.2 10*3/uL — AB (ref 4.0–10.5)

## 2017-06-26 LAB — ECHOCARDIOGRAM COMPLETE
HEIGHTINCHES: 64 in
WEIGHTICAEL: 1520.29 [oz_av]

## 2017-06-26 LAB — POTASSIUM: Potassium: 3.3 mmol/L — ABNORMAL LOW (ref 3.5–5.1)

## 2017-06-26 MED ORDER — MAGIC MOUTHWASH
5.0000 mL | Freq: Three times a day (TID) | ORAL | Status: DC | PRN
  Filled 2017-06-26: qty 5

## 2017-06-26 MED ORDER — SODIUM CHLORIDE 0.9 % IV BOLUS (SEPSIS)
500.0000 mL | Freq: Once | INTRAVENOUS | Status: AC
  Administered 2017-06-26: 500 mL via INTRAVENOUS

## 2017-06-26 MED ORDER — METHYLPREDNISOLONE SODIUM SUCC 125 MG IJ SOLR
60.0000 mg | Freq: Two times a day (BID) | INTRAMUSCULAR | Status: DC
  Administered 2017-06-26 – 2017-06-27 (×2): 60 mg via INTRAVENOUS
  Filled 2017-06-26 (×2): qty 2

## 2017-06-26 MED ORDER — FUROSEMIDE 10 MG/ML IJ SOLN
20.0000 mg | Freq: Once | INTRAMUSCULAR | Status: AC
  Administered 2017-06-26: 20 mg via INTRAVENOUS
  Filled 2017-06-26: qty 2

## 2017-06-26 MED ORDER — IPRATROPIUM BROMIDE 0.02 % IN SOLN: 0.5000 mg | Freq: Four times a day (QID) | RESPIRATORY_TRACT | Status: DC | PRN

## 2017-06-26 MED ORDER — LEVALBUTEROL HCL 1.25 MG/0.5ML IN NEBU: 1.2500 mg | INHALATION_SOLUTION | RESPIRATORY_TRACT | Status: DC | PRN

## 2017-06-26 NOTE — Evaluation (Signed)
Physical Therapy Evaluation Patient Details Name: [name] MRN: [id number] DOB: Jan 19, 1931 Today's Date: 06/26/2017   History of Present Illness  Pt adm with acute on chronic respiratory failure with hypoxia. PMH - COPD, htn  Clinical Impression  Pt admitted with above diagnosis and presents to PT with functional limitations due to deficits listed below (See PT problem list). Pt needs skilled PT to maximize independence and safety to allow discharge to home with intermittent assist of daughter. Feel pt will make steady progress toward return home.     Follow Up Recommendations Home health PT;Supervision - Intermittent    Equipment Recommendations  None recommended by PT    Recommendations for Other Services       Precautions / Restrictions Precautions Precautions: Fall Restrictions Weight Bearing Restrictions: No      Mobility  Bed Mobility Overal bed mobility: Modified Independent             General bed mobility comments: Incr time  Transfers Overall transfer level: Needs assistance Equipment used: Rolling walker (2 wheeled) Transfers: Sit to/from Stand Sit to Stand: Min guard         General transfer comment: Assist for safety. Verbal cues for hand placement  Ambulation/Gait Ambulation/Gait assistance: Min guard Ambulation Distance (Feet): 60 Feet Assistive device: Rolling walker (2 wheeled) Gait Pattern/deviations: Step-through pattern;Decreased step length - right;Decreased step length - left Gait velocity: decr Gait velocity interpretation: Below normal speed for age/gender General Gait Details: Assist for safety. Pt amb on 2L of O2 with SpO2 90% after amb. Pt took 3 standing rest breaks  Stairs            Wheelchair Mobility    Modified Rankin (Stroke Patients Only)       Balance Overall balance assessment: Needs assistance Sitting-balance support: No upper extremity supported;Feet supported Sitting balance-Leahy Scale: Good      Standing balance support: No upper extremity supported Standing balance-Leahy Scale: Fair                               Pertinent Vitals/Pain Pain Assessment: No/denies pain    Home Living Family/patient expects to be discharged to:: Private residence Living Arrangements: Alone Available Help at Discharge: Family;Available PRN/intermittently(dtr lives close by and available as needed) Type of Home: House Home Access: Level entry     Home Layout: One level Home Equipment: Environmental consultant - 2 wheels;Cane - single point      Prior Function Level of Independence: Independent with assistive device(s)         Comments: Uses cane or rolling walker as needed     Hand Dominance        Extremity/Trunk Assessment   Upper Extremity Assessment Upper Extremity Assessment: Generalized weakness    Lower Extremity Assessment Lower Extremity Assessment: Generalized weakness       Communication   Communication: No difficulties  Cognition Arousal/Alertness: Awake/alert Behavior During Therapy: WFL for tasks assessed/performed Overall Cognitive Status: Within Functional Limits for tasks assessed                                        General Comments      Exercises     Assessment/Plan    PT Assessment Patient needs continued PT services  PT Problem List Decreased strength;Decreased activity tolerance;Decreased balance;Decreased mobility;Decreased knowledge of use of DME;Cardiopulmonary status  limiting activity       PT Treatment Interventions DME instruction;Gait training;Functional mobility training;Therapeutic activities;Therapeutic exercise;Balance training;Patient/family education    PT Goals (Current goals can be found in the Care Plan section)  Acute Rehab PT Goals Patient Stated Goal: return home PT Goal Formulation: With patient Time For Goal Achievement: 07/03/17 Potential to Achieve Goals: Good    Frequency Min 3X/week    Barriers to discharge Decreased caregiver support Lives alone    Co-evaluation               AM-PAC PT "6 Clicks" Daily Activity  Outcome Measure Difficulty turning over in bed (including adjusting bedclothes, sheets and blankets)?: None Difficulty moving from lying on back to sitting on the side of the bed? : A Little Difficulty sitting down on and standing up from a chair with arms (e.g., wheelchair, bedside commode, etc,.)?: Unable Help needed moving to and from a bed to chair (including a wheelchair)?: A Little Help needed walking in hospital room?: A Little Help needed climbing 3-5 steps with a railing? : A Little 6 Click Score: 17    End of Session Equipment Utilized During Treatment: Gait belt Activity Tolerance: Patient limited by fatigue Patient left: in bed;with call bell/phone within reach;with family/visitor present Nurse Communication: Mobility status;Other (comment)(purewick removed for amb) PT Visit Diagnosis: Unsteadiness on feet (R26.81);History of falling (Z91.81);Muscle weakness (generalized) (M62.81)    Time: 7829-5621 PT Time Calculation (min) (ACUTE ONLY): 17 min   Charges:   PT Evaluation $PT Eval Moderate Complexity: 1 Mod     PT G CodesMarland Kitchen        [facility] [place] 06/26/2017, 4:17 PM

## 2017-06-26 NOTE — Progress Notes (Addendum)
PROGRESS NOTE [facility]   [name]   GLO:[id number] DOB: 02-07-1931  DOA: 06/22/2017 PCP: Lujean Amel, MD   Brief Narrative:  [name] is a 83 y/o F with PMHx significant for COPD, bronchiectasis, vocal cord paralysis, HTN, GERD, hypothyroidism, depression/anxiety presented with cough, SOB and fever. In the ED patient was found to be hypoxic and CXR showing possible RLL pneumonia, patient was admitted for further treatment and evaluation.   Subjective: Continues to improve, breathing much better, on O2 nasal canula sats at 95-99%. Still coughing some.  Denies chest pain.   Assessment & Plan: Acute respiratory failure with hypoxia - Continues to improve  Multifactorial CT with multifocal bronchopneumonia, bronchiectasis and COPD  No PE, ABG unremarkable  Patient initially on Rocephin/Azithro switched to Zosyn continue for now, if continues to improve can deescalate in AM  WBC decreasing but still high, predominant neutrophils, awaiting pathology review, suspect this is part of inflammatory process + steroids  Taper solumedrol  Continue nebulizer treatment decrease frequency as it may have caused hyperK+  Continue to wean O2 as tolerated  Strep Pnemo, legionella, MRSA PCR and respiratory panel negative   Hypothyroid  TSH normal  Continue synthroid  LBBB  ? New on EKG, Troponin negative x 3 - no past records available  ECHO unremarkable EF 60-65%, No WMA  Sinus tachy likely related to infectious process.  Follow up as outpatient.   HTN  BP stable  Continue home meds   Hypokalemia - improved  Replete PRN  Check BMP   Protein calorie malnutrition  Continue nutrition recommendations   HypoNa+  Resolved   Mild hyperK+ May be related to albuterol use vs hemolysis  Will give 500 cc bolus  Lasix 20 IV  Check K in PM   DVT prophylaxis: Lovenox  Code Status: Full code  Family Communication: Daughter at bedside  Disposition Plan: Home when  medically stable   Consultants:   None   Procedures:   None   Antimicrobials: Anti-infectives (From admission, onward)   Start     Dose/Rate Route Frequency Ordered Stop   06/25/17 0000  piperacillin-tazobactam (ZOSYN) IVPB 3.375 g     3.375 g 12.5 mL/hr over 240 Minutes Intravenous Every 8 hours 06/24/17 1536     06/24/17 1630  piperacillin-tazobactam (ZOSYN) IVPB 3.375 g     3.375 g 100 mL/hr over 30 Minutes Intravenous  Once 06/24/17 1536 06/24/17 1800   06/24/17 0000  azithromycin (ZITHROMAX) 500 mg in dextrose 5 % 250 mL IVPB  Status:  Discontinued     500 mg 250 mL/hr over 60 Minutes Intravenous Every 24 hours 06/23/17 0245 06/24/17 1530   06/23/17 2200  cefTRIAXone (ROCEPHIN) 1 g in dextrose 5 % 50 mL IVPB  Status:  Discontinued     1 g 100 mL/hr over 30 Minutes Intravenous Every 24 hours 06/23/17 0245 06/24/17 1530   06/23/17 0030  cefTRIAXone (ROCEPHIN) 1 g in dextrose 5 % 50 mL IVPB     1 g 100 mL/hr over 30 Minutes Intravenous  Once 06/23/17 0025 06/23/17 0122   06/23/17 0030  azithromycin (ZITHROMAX) 500 mg in dextrose 5 % 250 mL IVPB     500 mg 250 mL/hr over 60 Minutes Intravenous  Once 06/23/17 0025 06/23/17 0255          Objective: Vitals:   06/25/17 1950 06/25/17 2045 06/26/17 0634 06/26/17 0819  BP:  135/62 (!) 148/79   Pulse: (!) 117 (!) 117 (!) 111  Resp: 18 20 20    Temp:  98 F (36.7 C) 97.6 F (36.4 C)   TempSrc:  Oral Oral   SpO2: 92% 96% 97% 99%  Weight:   42.4 kg (93 lb 6.4 oz)   Height:        Intake/Output Summary (Last 24 hours) at 06/26/2017 0951 Last data filed at 06/26/2017 0636 Gross per 24 hour  Intake 410 ml  Output 1270 ml  Net -860 ml   Filed Weights   06/24/17 0458 06/25/17 0435 06/26/17 0634  Weight: 44.2 kg (97 lb 7.1 oz) 43.1 kg (95 lb 0.3 oz) 42.4 kg (93 lb 6.4 oz)    Examination:  General: NAD, sitting up, O2 2L [place]  Cardiovascular: Tachy 104, S1/S2 +, no rubs, no gallops Respiratory: Improve air entry,  diffuse rhonchi improved, mild expiratory wheezing  Abdominal: Soft, NT, ND, + bowel sounds Extremities: no edema  Data Reviewed: I have personally reviewed following labs and imaging studies  CBC: Recent Labs  Lab 06/23/17 0004 06/24/17 0909 06/24/17 1048 06/25/17 0441 06/26/17 0545  WBC 16.7* 50.4* 56.0* 47.4* 30.2*  NEUTROABS 12.9* 47.9* 53.2* 45.5* 28.4*  HGB 12.5 12.9 13.1 13.8 13.4  HCT 36.9 38.0 39.2 41.9 41.1  MCV 84.4 86.2 87.1 87.7 86.9  PLT 378 395 465* 461* 371*   Basic Metabolic Panel: Recent Labs  Lab 06/23/17 0004 06/23/17 0240 06/24/17 0909 06/25/17 0441 06/26/17 0545  NA 128*  --  136 136 135  K 3.0*  --  4.2 3.7 5.7*  CL 93*  --  102 92* 91*  CO2 23  --  24 33* 33*  GLUCOSE 162*  --  184* 175* 164*  BUN 9  --  7 11 15   CREATININE 0.74  --  0.45 0.54 0.52  CALCIUM 8.3*  --  8.6* 8.7* 8.5*  MG  --  2.3  --  1.8  --    GFR: Estimated Creatinine Clearance: 35 mL/min (by C-G formula based on SCr of 0.52 mg/dL). Liver Function Tests: Recent Labs  Lab 06/25/17 0441  AST 64*  ALT 52  ALKPHOS 134*  BILITOT 0.3  PROT 7.0  ALBUMIN 3.0*   No results for input(s): LIPASE, AMYLASE in the last 168 hours. No results for input(s): AMMONIA in the last 168 hours. Coagulation Profile: Recent Labs  Lab 06/23/17 0240  INR 1.36   Cardiac Enzymes: Recent Labs  Lab 06/23/17 0004 06/23/17 0240 06/23/17 0823 06/23/17 1419  TROPONINI <0.03 <0.03 <0.03 <0.03   BNP (last 3 results) No results for input(s): PROBNP in the last 8760 hours. HbA1C: No results for input(s): HGBA1C in the last 72 hours. CBG: No results for input(s): GLUCAP in the last 168 hours. Lipid Profile: No results for input(s): CHOL, HDL, LDLCALC, TRIG, CHOLHDL, LDLDIRECT in the last 72 hours. Thyroid Function Tests: No results for input(s): TSH, T4TOTAL, FREET4, T3FREE, THYROIDAB in the last 72 hours. Anemia Panel: No results for input(s): VITAMINB12, FOLATE, FERRITIN, TIBC, IRON,  RETICCTPCT in the last 72 hours. Sepsis Labs: Recent Labs  Lab 06/23/17 0240 06/23/17 0243 06/23/17 0432 06/23/17 0823  PROCALCITON 0.12  --   --   --   LATICACIDVEN  --  2.9* 4.2* 3.7*    Recent Results (from the past 240 hour(s))  Respiratory Panel by PCR     Status: None   Collection Time: 06/23/17  2:42 AM  Result Value Ref Range Status   Adenovirus NOT DETECTED NOT DETECTED Final   Coronavirus 229E  NOT DETECTED NOT DETECTED Final   Coronavirus HKU1 NOT DETECTED NOT DETECTED Final   Coronavirus NL63 NOT DETECTED NOT DETECTED Final   Coronavirus OC43 NOT DETECTED NOT DETECTED Final   Metapneumovirus NOT DETECTED NOT DETECTED Final   Rhinovirus / Enterovirus NOT DETECTED NOT DETECTED Final   Influenza A NOT DETECTED NOT DETECTED Final   Influenza B NOT DETECTED NOT DETECTED Final   Parainfluenza Virus 1 NOT DETECTED NOT DETECTED Final   Parainfluenza Virus 2 NOT DETECTED NOT DETECTED Final   Parainfluenza Virus 3 NOT DETECTED NOT DETECTED Final   Parainfluenza Virus 4 NOT DETECTED NOT DETECTED Final   Respiratory Syncytial Virus NOT DETECTED NOT DETECTED Final   Bordetella pertussis NOT DETECTED NOT DETECTED Final   Chlamydophila pneumoniae NOT DETECTED NOT DETECTED Final   Mycoplasma pneumoniae NOT DETECTED NOT DETECTED Final  Culture, blood (x 2)     Status: None (Preliminary result)   Collection Time: 06/23/17  4:32 AM  Result Value Ref Range Status   Specimen Description BLOOD RIGHT ANTECUBITAL  Final   Special Requests IN PEDIATRIC BOTTLE Blood Culture adequate volume  Final   Culture NO GROWTH 2 DAYS  Final   Report Status PENDING  Incomplete  Culture, blood (x 2)     Status: None (Preliminary result)   Collection Time: 06/23/17  4:32 AM  Result Value Ref Range Status   Specimen Description BLOOD  Final   Special Requests AEROBIC BOTTLE ONLY Blood Culture adequate volume  Final   Culture NO GROWTH 2 DAYS  Final   Report Status PENDING  Incomplete  Culture,  sputum-assessment     Status: None   Collection Time: 06/23/17  7:15 PM  Result Value Ref Range Status   Specimen Description EXPECTORATED SPUTUM  Final   Special Requests NONE  Final   Sputum evaluation THIS SPECIMEN IS ACCEPTABLE FOR SPUTUM CULTURE  Final   Report Status 06/24/2017 FINAL  Final  Culture, respiratory (NON-Expectorated)     Status: None (Preliminary result)   Collection Time: 06/23/17  7:15 PM  Result Value Ref Range Status   Specimen Description EXPECTORATED SPUTUM  Final   Special Requests NONE Reflexed from [id number]  Final   Gram Stain   Final    FEW WBC PRESENT, PREDOMINANTLY PMN RARE SQUAMOUS EPITHELIAL CELLS PRESENT RARE GRAM POSITIVE COCCI    Culture TOO YOUNG TO READ  Final   Report Status PENDING  Incomplete  MRSA PCR Screening     Status: None   Collection Time: 06/24/17  3:53 PM  Result Value Ref Range Status   MRSA by PCR NEGATIVE NEGATIVE Final    Comment:        The GeneXpert MRSA Assay (FDA approved for NASAL specimens only), is one component of a comprehensive MRSA colonization surveillance program. It is not intended to diagnose MRSA infection nor to guide or monitor treatment for MRSA infections.      Radiology Studies: Ct Angio Chest Pe W Or Wo Contrast  Result Date: 06/24/2017 CLINICAL DATA:  Shortness of breath. EXAM: CT ANGIOGRAPHY CHEST WITH CONTRAST TECHNIQUE: Multidetector CT imaging of the chest was performed using the standard protocol during bolus administration of intravenous contrast. Multiplanar CT image reconstructions and MIPs were obtained to evaluate the vascular anatomy. CONTRAST:  87mL ISOVUE-370 IOPAMIDOL (ISOVUE-370) INJECTION 76% COMPARISON:  None. FINDINGS: Cardiovascular: Mild cardiac enlargement. Aortic atherosclerosis. The main pulmonary artery is patent. No saddle embolus. No suspicious lobar or segmental pulmonary artery filling defects identified. Mediastinum/Nodes: The  trachea appears patent and is midline. Normal  appearance of the esophagus. No mediastinal, hilar, axillary or supraclavicular adenopathy. Lungs/Pleura: Small to moderate bilateral pleural effusions. Bronchiectasis is identified bilaterally. There are also large bilateral upper lobe predominant areas of ground-glass attenuation and with interlobular septal thickening. Upper Abdomen: No acute abnormality. Musculoskeletal: No chest wall abnormality. No acute or significant osseous findings. Review of the MIP images confirms the above findings. IMPRESSION: 1. No evidence for acute pulmonary embolus. 2. Bilateral areas of bronchiectasis. 3. Bilateral multifocal areas of ground-glass attenuation and interlobular septal thickening is noted. The appearance is nonspecific but in the acute setting may represent multifocal bronchopneumonia. Other inflammatory and infectious etiologies are not excluded. Suggest followup imaging to ensure resolution. In the absence of interval clearing a high-resolution CT of the chest would be advised. 4.  Aortic Atherosclerosis (ICD10-I70.0). Electronically Signed   By: Kerby Moors M.D.   On: 06/24/2017 14:52    Scheduled Meds: . amitriptyline  50 mg Oral QHS  . amLODipine  5 mg Oral QPM  . arformoterol  15 mcg Nebulization BID  . azelastine  1-2 spray Each Nare Daily   And  . fluticasone  1-2 spray Each Nare Daily  . budesonide (PULMICORT) nebulizer solution  0.5 mg Nebulization BID  . calcium-vitamin D  2 tablet Oral QODAY  . celecoxib  200 mg Oral BID  . cholecalciferol  1,000 Units Oral Daily  . enoxaparin (LOVENOX) injection  30 mg Subcutaneous Q24H  . feeding supplement (ENSURE ENLIVE)  237 mL Oral BID BM  . levothyroxine  88 mcg Oral QAC breakfast  . methylPREDNISolone (SOLU-MEDROL) injection  60 mg Intravenous Q12H  . multivitamin with minerals  1 tablet Oral Q M,W,F  . pantoprazole  40 mg Oral Daily  . tiotropium  1 capsule Inhalation Daily   Continuous Infusions: . piperacillin-tazobactam (ZOSYN)  IV  Stopped (06/26/17 0445)  . sodium chloride       LOS: 3 days    Time spent: Total of 35 minutes spent with pt, greater than 50% of which was spent in discussion of  treatment, counseling and coordination of care   Chipper Oman, MD Pager: Text Page via [email or web address]   If 7PM-7AM, please contact night-coverage [email or web address] 06/26/2017, 9:51 AM

## 2017-06-26 NOTE — Progress Notes (Signed)
   06/26/17 1051  Clinical Encounter Type  Visited With Patient and family together  Visit Type Initial  Referral From Patient  Consult/Referral To Chaplain   Patient was sitting up and fully awake and daughter was present.  Following up on a SCC for a notary.  Patient and daughter had a form that needed to be notarized.  Visited with family.  Will follow as needed. Chaplain Katherene Ponto

## 2017-06-26 NOTE — Care Management Note (Signed)
Case Management Note  Patient Details  Name: [name] MRN: [id number] Date of Birth: 1931/04/30  Subjective/Objective:  Acute on Chronic Resp Failure with Hypoxia.                 Action/Plan: Patient lives alone but her daughter lives close by and checks on her frequently; PCP: Dorthy Cooler, [place], MD; has private insurance with Medicare [place] with prescription drug coverage; pharmacy [facility] choice [facility]; patient reports not problem getting her medication; patient could benefit from a Disease Management Program for COPD/ Pneumonia; [place] choice offered, pt chose [facility]; Dan with [facility] called for arrangements; DME - walker and cane at home and she states that she uses them PRN. CM will continue to follow for disposition needs. Expected Discharge Date:  06/25/17               Expected Discharge Plan:  [place]  In-House Referral: [facility]    Discharge planning Services  CM Consult  Post Acute Care Choice:    Choice offered to:  Patient, Adult Children  HH Arranged:  RN, Disease Management, PT [place] Agency:  [place]  Status of Service:  In process, will continue to follow  Sherrilyn Rist [phone] 06/26/2017, 3:14 PM

## 2017-06-26 NOTE — Plan of Care (Signed)
  Education: Knowledge of General Education information will improve 06/26/2017 0307 - Progressing by Anson Fret, RN Note POC reviewed with pt.; instructed more on flutter valve.

## 2017-06-27 DIAGNOSIS — J449 Chronic obstructive pulmonary disease, unspecified: Secondary | ICD-10-CM

## 2017-06-27 LAB — BASIC METABOLIC PANEL
ANION GAP: 11 (ref 5–15)
BUN: 11 mg/dL (ref 6–20)
CO2: 33 mmol/L — AB (ref 22–32)
Calcium: 8.7 mg/dL — ABNORMAL LOW (ref 8.9–10.3)
Chloride: 88 mmol/L — ABNORMAL LOW (ref 101–111)
Creatinine, Ser: 0.5 mg/dL (ref 0.44–1.00)
GFR calc non Af Amer: >60 mL/min (ref >60)
GLUCOSE: 179 mg/dL — AB (ref 65–99)
POTASSIUM: 4 mmol/L (ref 3.5–5.1)
Sodium: 132 mmol/L — ABNORMAL LOW (ref 135–145)

## 2017-06-27 LAB — CBC WITH DIFFERENTIAL/PLATELET
BASOS ABS: 0 10*3/uL (ref 0.0–0.1)
BASOS PCT: 0 %
Eosinophils Absolute: 0 10*3/uL (ref 0.0–0.7)
Eosinophils Relative: 0 %
HEMATOCRIT: 44.6 % (ref 36.0–46.0)
HEMOGLOBIN: 14.9 g/dL (ref 12.0–15.0)
LYMPHS PCT: 3 %
Lymphs Abs: 0.8 10*3/uL (ref 0.7–4.0)
MCH: 29 pg (ref 26.0–34.0)
MCHC: 33.4 g/dL (ref 30.0–36.0)
MCV: 86.9 fL (ref 78.0–100.0)
MONOS PCT: 3 %
Monocytes Absolute: 0.8 10*3/uL (ref 0.1–1.0)
NEUTROS ABS: 24.3 10*3/uL — AB (ref 1.7–7.7)
NEUTROS PCT: 94 %
Platelets: 471 10*3/uL — ABNORMAL HIGH (ref 150–400)
RBC: 5.13 MIL/uL — ABNORMAL HIGH (ref 3.87–5.11)
RDW: 13.9 % (ref 11.5–15.5)
WBC: 25.9 10*3/uL — ABNORMAL HIGH (ref 4.0–10.5)

## 2017-06-27 LAB — MAGNESIUM: MAGNESIUM: 2 mg/dL (ref 1.7–2.4)

## 2017-06-27 LAB — CULTURE, RESPIRATORY W GRAM STAIN

## 2017-06-27 LAB — CULTURE, RESPIRATORY

## 2017-06-27 MED ORDER — METHYLPREDNISOLONE SODIUM SUCC 125 MG IJ SOLR
60.0000 mg | Freq: Two times a day (BID) | INTRAMUSCULAR | Status: AC
  Administered 2017-06-27: 60 mg via INTRAVENOUS
  Filled 2017-06-27: qty 2

## 2017-06-27 MED ORDER — AMOXICILLIN-POT CLAVULANATE 250-62.5 MG/5ML PO SUSR
500.0000 mg | Freq: Two times a day (BID) | ORAL | Status: DC
  Administered 2017-06-27 – 2017-06-29 (×5): 500 mg via ORAL
  Filled 2017-06-27 (×7): qty 10

## 2017-06-27 MED ORDER — PREDNISONE 50 MG PO TABS: 60.0000 mg | ORAL_TABLET | Freq: Every day | ORAL | Status: DC

## 2017-06-27 NOTE — Progress Notes (Signed)
PROGRESS NOTE [facility]   [name]   GUY:[id number] DOB: 24-Aug-1930  DOA: 06/22/2017 PCP: Lujean Amel, MD   Brief Narrative:  [name] is a 83 y/o F with PMHx significant for COPD, bronchiectasis, vocal cord paralysis, HTN, GERD, hypothyroidism, depression/anxiety presented with cough, SOB and fever. In the ED patient was found to be hypoxic and CXR showing possible RLL pneumonia, patient was admitted for further treatment and evaluation.   Subjective: Patient has no complaints today, feeling great up walking. No acute event overnight. Remains afebrile    Assessment & Plan: Acute respiratory failure with hypoxia - Continues to improve  Multifactorial CT with multifocal bronchopneumonia, bronchiectasis and COPD  No PE, ABG unremarkable  Patient initially on Rocephin/Azithro switched to Zosyn for 3 days, will de-escalate to Augmentin BIB and monitor overnight  WBC decreasing but still high, predominant neutrophils, awaiting pathology review, suspect this is part of inflammatory process + steroids  Switch solumedrol to prednisone in AM  Get O2 sat with ambulation, continue to wean O2 as tolerated  Strep Pnemo, legionella, MRSA PCR and respiratory panel negative   Hypothyroid  TSH normal  Continue synthroid  LBBB  ? New on EKG, Troponin negative x 3 - no past records available  ECHO unremarkable EF 60-65%, No WMA  Sinus tachy likely related to infectious process.  Follow up as outpatient.   HTN  BP stable  Continue home meds   Hypokalemia - resolved  Replete PRN  Check BMP   Protein calorie malnutrition  Continue nutrition recommendations   HypoNa+  Resolved   DVT prophylaxis: Lovenox  Code Status: Full code  Family Communication: Daughter at bedside  Disposition Plan: Home when medically stable   Consultants:   None   Procedures:   None   Antimicrobials: Anti-infectives (From admission, onward)   Start     Dose/Rate Route  Frequency Ordered Stop   06/27/17 1300  amoxicillin-clavulanate (AUGMENTIN) 250-62.5 MG/5ML suspension 500 mg     500 mg Oral Every 12 hours 06/27/17 1134     06/25/17 0000  piperacillin-tazobactam (ZOSYN) IVPB 3.375 g  Status:  Discontinued     3.375 g 12.5 mL/hr over 240 Minutes Intravenous Every 8 hours 06/24/17 1536 06/27/17 1134   06/24/17 1630  piperacillin-tazobactam (ZOSYN) IVPB 3.375 g     3.375 g 100 mL/hr over 30 Minutes Intravenous  Once 06/24/17 1536 06/24/17 1800   06/24/17 0000  azithromycin (ZITHROMAX) 500 mg in dextrose 5 % 250 mL IVPB  Status:  Discontinued     500 mg 250 mL/hr over 60 Minutes Intravenous Every 24 hours 06/23/17 0245 06/24/17 1530   06/23/17 2200  cefTRIAXone (ROCEPHIN) 1 g in dextrose 5 % 50 mL IVPB  Status:  Discontinued     1 g 100 mL/hr over 30 Minutes Intravenous Every 24 hours 06/23/17 0245 06/24/17 1530   06/23/17 0030  cefTRIAXone (ROCEPHIN) 1 g in dextrose 5 % 50 mL IVPB     1 g 100 mL/hr over 30 Minutes Intravenous  Once 06/23/17 0025 06/23/17 0122   06/23/17 0030  azithromycin (ZITHROMAX) 500 mg in dextrose 5 % 250 mL IVPB     500 mg 250 mL/hr over 60 Minutes Intravenous  Once 06/23/17 0025 06/23/17 0255         Objective: Vitals:   06/26/17 1918 06/26/17 1952 06/27/17 0513 06/27/17 0841  BP: (!) 154/76  (!) 150/78   Pulse: (!) 112  (!) 113   Resp: 18  18  Temp: 98.3 F (36.8 C)  98 F (36.7 C)   TempSrc: Oral  Oral   SpO2: 96% 98% 98% 98%  Weight:   43.8 kg (96 lb 9 oz)   Height:        Intake/Output Summary (Last 24 hours) at 06/27/2017 1134 Last data filed at 06/27/2017 0900 Gross per 24 hour  Intake 784.3 ml  Output 2020 ml  Net -1235.7 ml   Filed Weights   06/25/17 0435 06/26/17 0634 06/27/17 0513  Weight: 43.1 kg (95 lb 0.3 oz) 42.4 kg (93 lb 6.4 oz) 43.8 kg (96 lb 9 oz)    Examination:  General: Pt is alert, awake, not in acute distress Cardiovascular: RRR, S1/S2  Respiratory: Air entry significantly  improved, no wheezing, mild coarse sounds   Abdominal: Soft NT Extremities: no edema  Data Reviewed: I have personally reviewed following labs and imaging studies  CBC: Recent Labs  Lab 06/24/17 0909 06/24/17 1048 06/25/17 0441 06/26/17 0545 06/27/17 0817  WBC 50.4* 56.0* 47.4* 30.2* 25.9*  NEUTROABS 47.9* 53.2* 45.5* 28.4* 24.3*  HGB 12.9 13.1 13.8 13.4 14.9  HCT 38.0 39.2 41.9 41.1 44.6  MCV 86.2 87.1 87.7 86.9 86.9  PLT 395 465* 461* 443* 892*   Basic Metabolic Panel: Recent Labs  Lab 06/23/17 0004 06/23/17 0240 06/24/17 0909 06/25/17 0441 06/26/17 0545 06/26/17 1535 06/27/17 0817  NA 128*  --  136 136 135  --  132*  K 3.0*  --  4.2 3.7 5.7* 3.3* 4.0  CL 93*  --  102 92* 91*  --  88*  CO2 23  --  24 33* 33*  --  33*  GLUCOSE 162*  --  184* 175* 164*  --  179*  BUN 9  --  7 11 15   --  11  CREATININE 0.74  --  0.45 0.54 0.52  --  0.50  CALCIUM 8.3*  --  8.6* 8.7* 8.5*  --  8.7*  MG  --  2.3  --  1.8  --   --  2.0   GFR: Estimated Creatinine Clearance: 36.2 mL/min (by C-G formula based on SCr of 0.5 mg/dL). Liver Function Tests: Recent Labs  Lab 06/25/17 0441  AST 64*  ALT 52  ALKPHOS 134*  BILITOT 0.3  PROT 7.0  ALBUMIN 3.0*   No results for input(s): LIPASE, AMYLASE in the last 168 hours. No results for input(s): AMMONIA in the last 168 hours. Coagulation Profile: Recent Labs  Lab 06/23/17 0240  INR 1.36   Cardiac Enzymes: Recent Labs  Lab 06/23/17 0004 06/23/17 0240 06/23/17 0823 06/23/17 1419  TROPONINI <0.03 <0.03 <0.03 <0.03   BNP (last 3 results) No results for input(s): PROBNP in the last 8760 hours. HbA1C: No results for input(s): HGBA1C in the last 72 hours. CBG: No results for input(s): GLUCAP in the last 168 hours. Lipid Profile: No results for input(s): CHOL, HDL, LDLCALC, TRIG, CHOLHDL, LDLDIRECT in the last 72 hours. Thyroid Function Tests: No results for input(s): TSH, T4TOTAL, FREET4, T3FREE, THYROIDAB in the last 72  hours. Anemia Panel: No results for input(s): VITAMINB12, FOLATE, FERRITIN, TIBC, IRON, RETICCTPCT in the last 72 hours. Sepsis Labs: Recent Labs  Lab 06/23/17 0240 06/23/17 0243 06/23/17 0432 06/23/17 0823  PROCALCITON 0.12  --   --   --   LATICACIDVEN  --  2.9* 4.2* 3.7*    Recent Results (from the past 240 hour(s))  Respiratory Panel by PCR     Status: None  Collection Time: 06/23/17  2:42 AM  Result Value Ref Range Status   Adenovirus NOT DETECTED NOT DETECTED Final   Coronavirus 229E NOT DETECTED NOT DETECTED Final   Coronavirus HKU1 NOT DETECTED NOT DETECTED Final   Coronavirus NL63 NOT DETECTED NOT DETECTED Final   Coronavirus OC43 NOT DETECTED NOT DETECTED Final   Metapneumovirus NOT DETECTED NOT DETECTED Final   Rhinovirus / Enterovirus NOT DETECTED NOT DETECTED Final   Influenza A NOT DETECTED NOT DETECTED Final   Influenza B NOT DETECTED NOT DETECTED Final   Parainfluenza Virus 1 NOT DETECTED NOT DETECTED Final   Parainfluenza Virus 2 NOT DETECTED NOT DETECTED Final   Parainfluenza Virus 3 NOT DETECTED NOT DETECTED Final   Parainfluenza Virus 4 NOT DETECTED NOT DETECTED Final   Respiratory Syncytial Virus NOT DETECTED NOT DETECTED Final   Bordetella pertussis NOT DETECTED NOT DETECTED Final   Chlamydophila pneumoniae NOT DETECTED NOT DETECTED Final   Mycoplasma pneumoniae NOT DETECTED NOT DETECTED Final  Culture, blood (x 2)     Status: None (Preliminary result)   Collection Time: 06/23/17  4:32 AM  Result Value Ref Range Status   Specimen Description BLOOD RIGHT ANTECUBITAL  Final   Special Requests IN PEDIATRIC BOTTLE Blood Culture adequate volume  Final   Culture NO GROWTH 3 DAYS  Final   Report Status PENDING  Incomplete  Culture, blood (x 2)     Status: None (Preliminary result)   Collection Time: 06/23/17  4:32 AM  Result Value Ref Range Status   Specimen Description BLOOD  Final   Special Requests AEROBIC BOTTLE ONLY Blood Culture adequate volume   Final   Culture NO GROWTH 3 DAYS  Final   Report Status PENDING  Incomplete  Culture, sputum-assessment     Status: None   Collection Time: 06/23/17  7:15 PM  Result Value Ref Range Status   Specimen Description EXPECTORATED SPUTUM  Final   Special Requests NONE  Final   Sputum evaluation THIS SPECIMEN IS ACCEPTABLE FOR SPUTUM CULTURE  Final   Report Status 06/24/2017 FINAL  Final  Culture, respiratory (NON-Expectorated)     Status: None (Preliminary result)   Collection Time: 06/23/17  7:15 PM  Result Value Ref Range Status   Specimen Description EXPECTORATED SPUTUM  Final   Special Requests NONE Reflexed from [id number]  Final   Gram Stain   Final    FEW WBC PRESENT, PREDOMINANTLY PMN RARE SQUAMOUS EPITHELIAL CELLS PRESENT RARE GRAM POSITIVE COCCI    Culture MODERATE YEAST  Final   Report Status PENDING  Incomplete  MRSA PCR Screening     Status: None   Collection Time: 06/24/17  3:53 PM  Result Value Ref Range Status   MRSA by PCR NEGATIVE NEGATIVE Final    Comment:        The GeneXpert MRSA Assay (FDA approved for NASAL specimens only), is one component of a comprehensive MRSA colonization surveillance program. It is not intended to diagnose MRSA infection nor to guide or monitor treatment for MRSA infections.      Radiology Studies: No results found.  Scheduled Meds: . amitriptyline  50 mg Oral QHS  . amLODipine  5 mg Oral QPM  . amoxicillin-clavulanate  500 mg Oral Q12H  . arformoterol  15 mcg Nebulization BID  . azelastine  1-2 spray Each Nare Daily   And  . fluticasone  1-2 spray Each Nare Daily  . budesonide (PULMICORT) nebulizer solution  0.5 mg Nebulization BID  . calcium-vitamin D  2 tablet Oral QODAY  . celecoxib  200 mg Oral BID  . cholecalciferol  1,000 Units Oral Daily  . enoxaparin (LOVENOX) injection  30 mg Subcutaneous Q24H  . feeding supplement (ENSURE ENLIVE)  237 mL Oral BID BM  . levothyroxine  88 mcg Oral QAC breakfast  . methylPREDNISolone  (SOLU-MEDROL) injection  60 mg Intravenous Q12H  . multivitamin with minerals  1 tablet Oral Q M,W,F  . pantoprazole  40 mg Oral Daily  . [START ON 06/28/2017] predniSONE  60 mg Oral Q breakfast  . tiotropium  1 capsule Inhalation Daily   Continuous Infusions: . sodium chloride 500 mL (06/26/17 1027)     LOS: 4 days    Time spent: Total of 25 minutes spent with pt, greater than 50% of which was spent in discussion of  treatment, counseling and coordination of care   Chipper Oman, MD Pager: Text Page via [email or web address]   If 7PM-7AM, please contact night-coverage [email or web address] 06/27/2017, 11:34 AM

## 2017-06-27 NOTE — Progress Notes (Signed)
SATURATION QUALIFICATIONS: (This note is used to comply with regulatory documentation for home oxygen)  Patient Saturations on Room Air at Rest = 91%  Patient Saturations on Room Air while Ambulating = 86%  Patient Saturations on 2 Liters of oxygen while Ambulating = 92%  Please briefly explain why patient needs home oxygen:  Pt 86% ambulating on RA.

## 2017-06-27 NOTE — Plan of Care (Signed)
Pt. Free from injury. Uses call light to call for assistance with mobility.

## 2017-06-27 NOTE — Progress Notes (Signed)
Physical Therapy Treatment Patient Details Name: [name] MRN: [id number] DOB: [birth year] Today's Date: 06/27/2017    History of Present Illness Pt admitted on 06/22/17 with acute on chronic respiratory failure with hypoxia. PMH - COPD, HTN.   PT Comments    Pt declining amb in hallway secondary to incontinence and needing to be washed up. Today's session focused on functional task training; pt requiring supervision for ambulation throughout room with RW, including using bathroom, dressing, and brushing teeth at sink. Dependent for LE bathing while standing. Daughter present throughout session and states she plans to live with pt at d/c to assist as needed. From a mobility perspective, feel pt is safe to return home with assist from daughter, RW use, and HHPT. Will continue to follow acutely to maximize functional mobility and independence.   Follow Up Recommendations  Home health PT;Supervision - Intermittent     Equipment Recommendations  None recommended by PT    Recommendations for Other Services       Precautions / Restrictions Precautions Precautions: Fall Precaution Comments: Watch SpO2 Restrictions Weight Bearing Restrictions: No    Mobility  Bed Mobility Overal bed mobility: Independent                Transfers Overall transfer level: Needs assistance Equipment used: Rolling walker (2 wheeled) Transfers: Sit to/from Stand Sit to Stand: Supervision         General transfer comment: Stood x3 from bed and toilet with RW and supervision for safety; good technique and hand placement  Ambulation/Gait Ambulation/Gait assistance: Supervision Ambulation Distance (Feet): 40 Feet Assistive device: Rolling walker (2 wheeled) Gait Pattern/deviations: Step-through pattern;Decreased stride length;Trunk flexed Gait velocity: Decreased Gait velocity interpretation: <1.8 ft/sec, indicative of risk for recurrent falls General Gait Details: Amb throughout room  with RW and supervision for safety. 1x seated rest break on toilet and 1x standing rest break while brushing teeth at sink. SpO2 down to 84% on 2L O2 [place]   Stairs            Wheelchair Mobility    Modified Rankin (Stroke Patients Only)       Balance Overall balance assessment: Needs assistance Sitting-balance support: No upper extremity supported;Feet supported Sitting balance-Leahy Scale: Good     Standing balance support: No upper extremity supported Standing balance-Leahy Scale: Fair Standing balance comment: Can static stand for gown change and brushing teeth at sink with no UE support and supervision for safety. Dynamic stability much improved with RW                            Cognition Arousal/Alertness: Awake/alert Behavior During Therapy: WFL for tasks assessed/performed Overall Cognitive Status: Within Functional Limits for tasks assessed                                        Exercises      General Comments General comments (skin integrity, edema, etc.): Daughter present during session      Pertinent Vitals/Pain Pain Assessment: No/denies pain    Home Living                      Prior Function            PT Goals (current goals can now be found in the care plan section) Acute Rehab PT Goals Patient Stated Goal:  return home PT Goal Formulation: With patient Time For Goal Achievement: 07/03/17 Potential to Achieve Goals: Good Progress towards PT goals: Progressing toward goals    Frequency    Min 3X/week      PT Plan Current plan remains appropriate    Co-evaluation              AM-PAC PT "6 Clicks" Daily Activity  Outcome Measure  Difficulty turning over in bed (including adjusting bedclothes, sheets and blankets)?: None Difficulty moving from lying on back to sitting on the side of the bed? : None Difficulty sitting down on and standing up from a chair with arms (e.g., wheelchair, bedside  commode, etc,.)?: A Little Help needed moving to and from a bed to chair (including a wheelchair)?: A Little Help needed walking in hospital room?: A Little Help needed climbing 3-5 steps with a railing? : A Little 6 Click Score: 20    End of Session Equipment Utilized During Treatment: Gait belt;Oxygen Activity Tolerance: Patient tolerated treatment well;Patient limited by fatigue Patient left: in bed;with call bell/phone within reach;with family/visitor present Nurse Communication: Mobility status;Other (comment)(Pt washed up, needs new purewick) PT Visit Diagnosis: Unsteadiness on feet (R26.81);History of falling (Z91.81);Muscle weakness (generalized) (M62.81)     Time: 4098-1191 PT Time Calculation (min) (ACUTE ONLY): 34 min  Charges:  $Gait Training: 8-22 mins $Therapeutic Activity: 8-22 mins                    G Codes:      Mabeline Caras, PT, DPT Acute Rehab Services  Pager: [place] 06/27/2017, 9:13 AM

## 2017-06-28 LAB — CULTURE, BLOOD (ROUTINE X 2)
CULTURE: NO GROWTH
CULTURE: NO GROWTH
Special Requests: ADEQUATE
Special Requests: ADEQUATE

## 2017-06-28 LAB — MAGNESIUM: MAGNESIUM: 2 mg/dL (ref 1.7–2.4)

## 2017-06-28 LAB — BASIC METABOLIC PANEL
ANION GAP: 12 (ref 5–15)
BUN: 13 mg/dL (ref 6–20)
CO2: 33 mmol/L — ABNORMAL HIGH (ref 22–32)
Calcium: 8.6 mg/dL — ABNORMAL LOW (ref 8.9–10.3)
Chloride: 89 mmol/L — ABNORMAL LOW (ref 101–111)
Creatinine, Ser: 0.59 mg/dL (ref 0.44–1.00)
GFR calc Af Amer: >60 mL/min (ref >60)
Glucose, Bld: 150 mg/dL — ABNORMAL HIGH (ref 65–99)
POTASSIUM: 2.8 mmol/L — AB (ref 3.5–5.1)
SODIUM: 134 mmol/L — AB (ref 135–145)

## 2017-06-28 LAB — CBC WITH DIFFERENTIAL/PLATELET
BASOS ABS: 0 10*3/uL (ref 0.0–0.1)
Basophils Relative: 0 %
EOS ABS: 0 10*3/uL (ref 0.0–0.7)
EOS PCT: 0 %
HCT: 41.8 % (ref 36.0–46.0)
Hemoglobin: 14 g/dL (ref 12.0–15.0)
Lymphocytes Relative: 10 %
Lymphs Abs: 2 10*3/uL (ref 0.7–4.0)
MCH: 28.3 pg (ref 26.0–34.0)
MCHC: 33.5 g/dL (ref 30.0–36.0)
MCV: 84.4 fL (ref 78.0–100.0)
Monocytes Absolute: 0.7 10*3/uL (ref 0.1–1.0)
Monocytes Relative: 4 %
Neutro Abs: 16.8 10*3/uL — ABNORMAL HIGH (ref 1.7–7.7)
Neutrophils Relative %: 86 %
PLATELETS: 481 10*3/uL — AB (ref 150–400)
RBC: 4.95 MIL/uL (ref 3.87–5.11)
RDW: 13.4 % (ref 11.5–15.5)
WBC: 19.5 10*3/uL — AB (ref 4.0–10.5)

## 2017-06-28 LAB — PATHOLOGIST SMEAR REVIEW

## 2017-06-28 MED ORDER — PREDNISONE 50 MG PO TABS
60.0000 mg | ORAL_TABLET | Freq: Every day | ORAL | Status: DC
  Administered 2017-06-28 – 2017-06-29 (×2): 60 mg via ORAL
  Filled 2017-06-28 (×2): qty 1

## 2017-06-28 MED ORDER — LEVOTHYROXINE SODIUM 88 MCG PO TABS
88.0000 ug | ORAL_TABLET | Freq: Every day | ORAL | Status: DC
  Administered 2017-06-28 – 2017-06-29 (×2): 88 ug via ORAL
  Filled 2017-06-28 (×2): qty 1

## 2017-06-28 MED ORDER — ALUM & MAG HYDROXIDE-SIMETH 200-200-20 MG/5ML PO SUSP
15.0000 mL | Freq: Four times a day (QID) | ORAL | Status: DC | PRN
  Administered 2017-06-28: 15 mL via ORAL
  Filled 2017-06-28: qty 30

## 2017-06-28 MED ORDER — POTASSIUM CHLORIDE 10 MEQ/100ML IV SOLN
10.0000 meq | INTRAVENOUS | Status: AC
  Administered 2017-06-28 (×3): 10 meq via INTRAVENOUS
  Filled 2017-06-28 (×3): qty 100

## 2017-06-28 MED ORDER — POTASSIUM CHLORIDE 10 MEQ/100ML IV SOLN: 10.0000 meq | INTRAVENOUS | Status: DC

## 2017-06-28 NOTE — Progress Notes (Signed)
Physical Therapy Treatment Patient Details Name: [name] MRN: [id number] DOB: 1931/01/16 Today's Date: 06/28/2017    History of Present Illness Pt admitted on 06/22/17 with acute on chronic respiratory failure with hypoxia. PMH - COPD, HTN.    PT Comments    Patient received up in chair, pleasant and willing to work with skilled PT services this afternoon. Began session with seated and standing functional exercises with short rest breaks PRN due to shortness of breath/HR increase. Note O2 96% RA and HR 115BPM at rest. Also performed gait approximately 18f with rolling walker in the room with S, assistance for line management from PT; at end of gait distance O2 89% room air, HR 125-130BPM. O2 did briefly drop to 84% with repeated sit to stand practice for hand placement/safety with patient, however recovered back to 93% with seated rest and pursed lip breathing on room air. Patient left up in chair with all needs met, family visiting this afternoon.    Follow Up Recommendations  Home health PT;Supervision - Intermittent     Equipment Recommendations  None recommended by PT    Recommendations for Other Services       Precautions / Restrictions Precautions Precautions: Fall Precaution Comments: Watch SpO2 Restrictions Weight Bearing Restrictions: No    Mobility  Bed Mobility               General bed mobility comments: DNT, received up in chair   Transfers Overall transfer level: Needs assistance Equipment used: Rolling walker (2 wheeled) Transfers: Sit to/from Stand Sit to Stand: Supervision         General transfer comment: sit to stand approx 6-8 times throughout session, VC for hand placement and safety   Ambulation/Gait Ambulation/Gait assistance: Supervision Ambulation Distance (Feet): 60 Feet Assistive device: Rolling walker (2 wheeled) Gait Pattern/deviations: Step-through pattern;Decreased stride length;Trunk flexed     General Gait Details:  amb in room with RW, S for safety and line management; able to ambulate 662fcontinuously with O2 remaining mid to low 90s on room air with cues for pursed lip breathing    Stairs            Wheelchair Mobility    Modified Rankin (Stroke Patients Only)       Balance                                            Cognition Arousal/Alertness: Awake/alert Behavior During Therapy: WFL for tasks assessed/performed Overall Cognitive Status: Within Functional Limits for tasks assessed                                        Exercises General Exercises - Lower Extremity Long Arc Quad: Both;10 reps;Seated Hip ABduction/ADduction: Both;10 reps;Seated(manual resistance from PT ) Hip Flexion/Marching: Both;20 reps;Seated;Standing Heel Raises: Both;10 reps;Standing    General Comments        Pertinent Vitals/Pain Pain Assessment: No/denies pain    Home Living                      Prior Function            PT Goals (current goals can now be found in the care plan section) Acute Rehab PT Goals Patient Stated Goal: return home PT Goal Formulation:  With patient Time For Goal Achievement: 07/03/17 Potential to Achieve Goals: Good Progress towards PT goals: Progressing toward goals    Frequency    Min 3X/week      PT Plan Current plan remains appropriate    Co-evaluation              AM-PAC PT "6 Clicks" Daily Activity  Outcome Measure  Difficulty turning over in bed (including adjusting bedclothes, sheets and blankets)?: None Difficulty moving from lying on back to sitting on the side of the bed? : None Difficulty sitting down on and standing up from a chair with arms (e.g., wheelchair, bedside commode, etc,.)?: None Help needed moving to and from a bed to chair (including a wheelchair)?: None Help needed walking in hospital room?: A Little Help needed climbing 3-5 steps with a railing? : A Little 6 Click Score:  22    End of Session Equipment Utilized During Treatment: Oxygen Activity Tolerance: Patient tolerated treatment well;Patient limited by fatigue Patient left: in chair;with call bell/phone within reach;with family/visitor present Nurse Communication: Other (comment)(patient's IV is hurting/patient requests RN look at IV site ) PT Visit Diagnosis: Unsteadiness on feet (R26.81);History of falling (Z91.81);Muscle weakness (generalized) (M62.81)     Time: 6314-9702 PT Time Calculation (min) (ACUTE ONLY): 24 min  Charges:  $Gait Training: 8-22 mins $Therapeutic Exercise: 8-22 mins                    G Codes:       Deniece Ree PT, DPT, CBIS  Supplemental Physical Therapist [place]

## 2017-06-28 NOTE — Progress Notes (Signed)
Patient is to go home on oxygen; [facility] called; oxygen to be delivered to the room today prior to going home; B Yale RN,MHA,BSN [phone]

## 2017-06-28 NOTE — Consult Note (Signed)
   [facility] CM Inpatient Consult   06/28/2017  [name] June 17, 1931 [id number]   Patient assess for high risk in the Medicare ACO. Met with the patient and her daughter, [name] at the bedside regarding the benefits of [facility] services. Explained that [facility] is a covered benefit of insurance. Patient endorses Dr. Lujean Amel, at [place] is her primary care provider.  This office generally provides the transition of care follow up for his patients. Review information for [facility], 24 hour nurse advise line magnet, and a brochure was provided with contact information. Explained that [facility] does not interfere with or replace any services arranged by the inpatient care management staff.  Patient felt she has what she needs with [place] but she will call if she feels she needs additional services when they finish.    She denies issues with medications uses [facility] for pharmacy.  She denies any problems with transportation as daughters are available to transport.  He daughter states her food intake is her issue and her sodium level.  She endorses she had a nutritionist to assist as well.  She currently denies any additional concerns.  Will assign to COPD/Pneumonia follow up calls.  For questions, please contact:   Natividad Brood, RN BSN [facility] Liaison  [phone] business mobile phone Toll free office [phone]

## 2017-06-28 NOTE — Progress Notes (Signed)
PROGRESS NOTE [facility]   SIMONNE BOULOS   NWG:[id number] DOB: 1931-03-17  DOA: 06/22/2017 PCP: Lujean Amel, MD   Brief Narrative:  [name] is a 83 y/o F with PMHx significant for COPD, bronchiectasis, vocal cord paralysis, HTN, GERD, hypothyroidism, depression/anxiety presented with cough, SOB and fever. In the ED patient was found to be hypoxic and CXR showing possible RLL pneumonia, patient was admitted for further treatment and evaluation. CT chest showed multifocal bronchopneumonia, bronchiectasis and COPD. Patient was treated with broad spectrum abx and IV solumedrol. Patient initially required high flow oxygen subsequently was weaned off to RA, although patient still needing oxigen with ambulation. Patient WBC decreased and patient remains afebrile   Subjective: Patient seen and examined, off oxygen when at rest. Cough improved, afebrile, no acute events overnight.   Assessment & Plan: Acute respiratory failure with hypoxia - Continues to improve  Multifactorial CT with multifocal bronchopneumonia, bronchiectasis and COPD  No PE, ABG unremarkable  Patient initially on Rocephin/Azithro switched to Zosyn for 3 days, transitioned to Augmentin BIB to complete total of 14 days of abx therapy, end date 07/06/17 WBC decreasing, predominant neutrophils, from infectious process and steroid use, check cbc in AM Initially treated with solumedrol, transitioned to prednisone will continue taper for 14 days Ambulatory O2 sats, shows that she needs O2 with ambulation  Strep Pnemo, legionella, MRSA PCR and respiratory panel negative   Hypothyroid  TSH normal  Continue synthroid  LBBB  ? New on EKG, Troponin negative x 3 - no past records available  ECHO unremarkable EF 60-65%, No WMA  Sinus tachy improved  Follow up as outpatient.   HTN  BP stable  Continue home meds   Hypokalemia  Mag normal, replete with IV K  Check BMP in AM   Protein calorie malnutrition    Continue nutrition recommendations   HypoNa+  Resolved   DVT prophylaxis: Lovenox  Code Status: Full code  Family Communication: Daughter at bedside  Disposition Plan: Home when medically stable   Consultants:   None   Procedures:   None   Antimicrobials: Anti-infectives (From admission, onward)   Start     Dose/Rate Route Frequency Ordered Stop   06/27/17 1300  amoxicillin-clavulanate (AUGMENTIN) 250-62.5 MG/5ML suspension 500 mg     500 mg Oral Every 12 hours 06/27/17 1134     06/25/17 0000  piperacillin-tazobactam (ZOSYN) IVPB 3.375 g  Status:  Discontinued     3.375 g 12.5 mL/hr over 240 Minutes Intravenous Every 8 hours 06/24/17 1536 06/27/17 1134   06/24/17 1630  piperacillin-tazobactam (ZOSYN) IVPB 3.375 g     3.375 g 100 mL/hr over 30 Minutes Intravenous  Once 06/24/17 1536 06/24/17 1800   06/24/17 0000  azithromycin (ZITHROMAX) 500 mg in dextrose 5 % 250 mL IVPB  Status:  Discontinued     500 mg 250 mL/hr over 60 Minutes Intravenous Every 24 hours 06/23/17 0245 06/24/17 1530   06/23/17 2200  cefTRIAXone (ROCEPHIN) 1 g in dextrose 5 % 50 mL IVPB  Status:  Discontinued     1 g 100 mL/hr over 30 Minutes Intravenous Every 24 hours 06/23/17 0245 06/24/17 1530   06/23/17 0030  cefTRIAXone (ROCEPHIN) 1 g in dextrose 5 % 50 mL IVPB     1 g 100 mL/hr over 30 Minutes Intravenous  Once 06/23/17 0025 06/23/17 0122   06/23/17 0030  azithromycin (ZITHROMAX) 500 mg in dextrose 5 % 250 mL IVPB     500 mg 250 mL/hr  over 60 Minutes Intravenous  Once 06/23/17 0025 06/23/17 0255         Objective: Vitals:   06/28/17 0616 06/28/17 0834 06/28/17 0836 06/28/17 0837  BP: 139/77     Pulse: (!) 107     Resp: 18     Temp: 97.8 F (36.6 C)     TempSrc: Oral     SpO2: 96% 99% 99% 99%  Weight: 40.5 kg (89 lb 3.2 oz)     Height:        Intake/Output Summary (Last 24 hours) at 06/28/2017 1305 Last data filed at 06/28/2017 1245 Gross per 24 hour  Intake 480 ml  Output 2700  ml  Net -2220 ml   Filed Weights   06/26/17 0634 06/27/17 0513 06/28/17 0616  Weight: 42.4 kg (93 lb 6.4 oz) 43.8 kg (96 lb 9 oz) 40.5 kg (89 lb 3.2 oz)    Examination:  General: Pt is alert, awake, not in acute distress Cardiovascular: RRR, S1/S2 +, no rubs, no gallops Respiratory: Good air entry, mild rales on RLL and mid LL  Abdominal: Soft, NT, ND Extremities: no edemas  Data Reviewed: I have personally reviewed following labs and imaging studies  CBC: Recent Labs  Lab 06/24/17 1048 06/25/17 0441 06/26/17 0545 06/27/17 0817 06/28/17 0804  WBC 56.0* 47.4* 30.2* 25.9* 19.5*  NEUTROABS 53.2* 45.5* 28.4* 24.3* 16.8*  HGB 13.1 13.8 13.4 14.9 14.0  HCT 39.2 41.9 41.1 44.6 41.8  MCV 87.1 87.7 86.9 86.9 84.4  PLT 465* 461* 443* 471* 426*   Basic Metabolic Panel: Recent Labs  Lab 06/23/17 0240 06/24/17 0909 06/25/17 0441 06/26/17 0545 06/26/17 1535 06/27/17 0817 06/28/17 0804  NA  --  136 136 135  --  132* 134*  K  --  4.2 3.7 5.7* 3.3* 4.0 2.8*  CL  --  102 92* 91*  --  88* 89*  CO2  --  24 33* 33*  --  33* 33*  GLUCOSE  --  184* 175* 164*  --  179* 150*  BUN  --  7 11 15   --  11 13  CREATININE  --  0.45 0.54 0.52  --  0.50 0.59  CALCIUM  --  8.6* 8.7* 8.5*  --  8.7* 8.6*  MG 2.3  --  1.8  --   --  2.0 2.0   GFR: Estimated Creatinine Clearance: 33.5 mL/min (by C-G formula based on SCr of 0.59 mg/dL). Liver Function Tests: Recent Labs  Lab 06/25/17 0441  AST 64*  ALT 52  ALKPHOS 134*  BILITOT 0.3  PROT 7.0  ALBUMIN 3.0*   No results for input(s): LIPASE, AMYLASE in the last 168 hours. No results for input(s): AMMONIA in the last 168 hours. Coagulation Profile: Recent Labs  Lab 06/23/17 0240  INR 1.36   Cardiac Enzymes: Recent Labs  Lab 06/23/17 0004 06/23/17 0240 06/23/17 0823 06/23/17 1419  TROPONINI <0.03 <0.03 <0.03 <0.03   BNP (last 3 results) No results for input(s): PROBNP in the last 8760 hours. HbA1C: No results for input(s):  HGBA1C in the last 72 hours. CBG: No results for input(s): GLUCAP in the last 168 hours. Lipid Profile: No results for input(s): CHOL, HDL, LDLCALC, TRIG, CHOLHDL, LDLDIRECT in the last 72 hours. Thyroid Function Tests: No results for input(s): TSH, T4TOTAL, FREET4, T3FREE, THYROIDAB in the last 72 hours. Anemia Panel: No results for input(s): VITAMINB12, FOLATE, FERRITIN, TIBC, IRON, RETICCTPCT in the last 72 hours. Sepsis Labs: Recent Labs  Lab  06/23/17 0240 06/23/17 0243 06/23/17 0432 06/23/17 0823  PROCALCITON 0.12  --   --   --   LATICACIDVEN  --  2.9* 4.2* 3.7*    Recent Results (from the past 240 hour(s))  Respiratory Panel by PCR     Status: None   Collection Time: 06/23/17  2:42 AM  Result Value Ref Range Status   Adenovirus NOT DETECTED NOT DETECTED Final   Coronavirus 229E NOT DETECTED NOT DETECTED Final   Coronavirus HKU1 NOT DETECTED NOT DETECTED Final   Coronavirus NL63 NOT DETECTED NOT DETECTED Final   Coronavirus OC43 NOT DETECTED NOT DETECTED Final   Metapneumovirus NOT DETECTED NOT DETECTED Final   Rhinovirus / Enterovirus NOT DETECTED NOT DETECTED Final   Influenza A NOT DETECTED NOT DETECTED Final   Influenza B NOT DETECTED NOT DETECTED Final   Parainfluenza Virus 1 NOT DETECTED NOT DETECTED Final   Parainfluenza Virus 2 NOT DETECTED NOT DETECTED Final   Parainfluenza Virus 3 NOT DETECTED NOT DETECTED Final   Parainfluenza Virus 4 NOT DETECTED NOT DETECTED Final   Respiratory Syncytial Virus NOT DETECTED NOT DETECTED Final   Bordetella pertussis NOT DETECTED NOT DETECTED Final   Chlamydophila pneumoniae NOT DETECTED NOT DETECTED Final   Mycoplasma pneumoniae NOT DETECTED NOT DETECTED Final  Culture, blood (x 2)     Status: None (Preliminary result)   Collection Time: 06/23/17  4:32 AM  Result Value Ref Range Status   Specimen Description BLOOD RIGHT ANTECUBITAL  Final   Special Requests IN PEDIATRIC BOTTLE Blood Culture adequate volume  Final    Culture NO GROWTH 4 DAYS  Final   Report Status PENDING  Incomplete  Culture, blood (x 2)     Status: None (Preliminary result)   Collection Time: 06/23/17  4:32 AM  Result Value Ref Range Status   Specimen Description BLOOD  Final   Special Requests AEROBIC BOTTLE ONLY Blood Culture adequate volume  Final   Culture NO GROWTH 4 DAYS  Final   Report Status PENDING  Incomplete  Culture, sputum-assessment     Status: None   Collection Time: 06/23/17  7:15 PM  Result Value Ref Range Status   Specimen Description EXPECTORATED SPUTUM  Final   Special Requests NONE  Final   Sputum evaluation THIS SPECIMEN IS ACCEPTABLE FOR SPUTUM CULTURE  Final   Report Status 06/24/2017 FINAL  Final  Culture, respiratory (NON-Expectorated)     Status: None   Collection Time: 06/23/17  7:15 PM  Result Value Ref Range Status   Specimen Description EXPECTORATED SPUTUM  Final   Special Requests NONE Reflexed from [id number]  Final   Gram Stain   Final    FEW WBC PRESENT, PREDOMINANTLY PMN RARE SQUAMOUS EPITHELIAL CELLS PRESENT RARE GRAM POSITIVE COCCI    Culture MODERATE CANDIDA ALBICANS  Final   Report Status 06/27/2017 FINAL  Final  MRSA PCR Screening     Status: None   Collection Time: 06/24/17  3:53 PM  Result Value Ref Range Status   MRSA by PCR NEGATIVE NEGATIVE Final    Comment:        The GeneXpert MRSA Assay (FDA approved for NASAL specimens only), is one component of a comprehensive MRSA colonization surveillance program. It is not intended to diagnose MRSA infection nor to guide or monitor treatment for MRSA infections.      Radiology Studies: No results found.  Scheduled Meds: . amitriptyline  50 mg Oral QHS  . amLODipine  5 mg Oral QPM  .  amoxicillin-clavulanate  500 mg Oral Q12H  . arformoterol  15 mcg Nebulization BID  . azelastine  1-2 spray Each Nare Daily   And  . fluticasone  1-2 spray Each Nare Daily  . budesonide (PULMICORT) nebulizer solution  0.5 mg Nebulization BID    . calcium-vitamin D  2 tablet Oral QODAY  . celecoxib  200 mg Oral BID  . cholecalciferol  1,000 Units Oral Daily  . enoxaparin (LOVENOX) injection  30 mg Subcutaneous Q24H  . feeding supplement (ENSURE ENLIVE)  237 mL Oral BID BM  . levothyroxine  88 mcg Oral QAC breakfast  . multivitamin with minerals  1 tablet Oral Q M,W,F  . pantoprazole  40 mg Oral Daily  . predniSONE  60 mg Oral Q breakfast  . tiotropium  1 capsule Inhalation Daily   Continuous Infusions: . potassium chloride 10 mEq (06/28/17 1303)     LOS: 5 days    Time spent: Total of 15 minutes spent with pt, greater than 50% of which was spent in discussion of  treatment, counseling and coordination of care   Chipper Oman, MD Pager: Text Page via [email or web address]   If 7PM-7AM, please contact night-coverage [email or web address] 06/28/2017, 1:05 PM

## 2017-06-29 LAB — CBC WITH DIFFERENTIAL/PLATELET
BASOS ABS: 0 10*3/uL (ref 0.0–0.1)
Basophils Relative: 0 %
EOS ABS: 0.5 10*3/uL (ref 0.0–0.7)
EOS PCT: 3 %
HCT: 40.4 % (ref 36.0–46.0)
HEMOGLOBIN: 13.3 g/dL (ref 12.0–15.0)
LYMPHS ABS: 2.1 10*3/uL (ref 0.7–4.0)
LYMPHS PCT: 12 %
MCH: 28 pg (ref 26.0–34.0)
MCHC: 32.9 g/dL (ref 30.0–36.0)
MCV: 85.1 fL (ref 78.0–100.0)
Monocytes Absolute: 2.2 10*3/uL — ABNORMAL HIGH (ref 0.1–1.0)
Monocytes Relative: 13 %
NEUTROS PCT: 72 %
Neutro Abs: 12.9 10*3/uL — ABNORMAL HIGH (ref 1.7–7.7)
PLATELETS: 408 10*3/uL — AB (ref 150–400)
RBC: 4.75 MIL/uL (ref 3.87–5.11)
RDW: 13.6 % (ref 11.5–15.5)
WBC: 17.7 10*3/uL — AB (ref 4.0–10.5)

## 2017-06-29 LAB — BASIC METABOLIC PANEL
Anion gap: 7 (ref 5–15)
BUN: 12 mg/dL (ref 6–20)
CHLORIDE: 93 mmol/L — AB (ref 101–111)
CO2: 35 mmol/L — ABNORMAL HIGH (ref 22–32)
Calcium: 8.4 mg/dL — ABNORMAL LOW (ref 8.9–10.3)
Creatinine, Ser: 0.51 mg/dL (ref 0.44–1.00)
Glucose, Bld: 79 mg/dL (ref 65–99)
POTASSIUM: 2.8 mmol/L — AB (ref 3.5–5.1)
SODIUM: 135 mmol/L (ref 135–145)

## 2017-06-29 MED ORDER — POTASSIUM CHLORIDE 10 MEQ/100ML IV SOLN
10.0000 meq | INTRAVENOUS | Status: AC
  Administered 2017-06-29 (×3): 10 meq via INTRAVENOUS
  Filled 2017-06-29 (×3): qty 100

## 2017-06-29 MED ORDER — AMOXICILLIN-POT CLAVULANATE 250-62.5 MG/5ML PO SUSR: 500.0000 mg | Freq: Two times a day (BID) | ORAL | 0 refills | Status: AC

## 2017-06-29 MED ORDER — PREDNISONE 10 MG PO TABS: ORAL_TABLET | ORAL | 0 refills | Status: DC

## 2017-06-29 MED ORDER — POTASSIUM CHLORIDE 20 MEQ/15ML (10%) PO SOLN: 20.0000 meq | Freq: Every day | ORAL | 0 refills | Status: DC

## 2017-06-29 NOTE — Progress Notes (Signed)
Pt has orders to be discharged. Discharge instructions given and pt has no additional questions at this time. Medication regimen reviewed and pt educated. Pt verbalized understanding and has no additional questions. Telemetry box removed. IV removed and site in good condition. Pt stable and waiting for transportation.  Lemonte Al RN 

## 2017-06-29 NOTE — Discharge Summary (Signed)
Physician Discharge Summary  [name]  QQI:[id number]  DOB: 1931/06/02  DOA: 06/22/2017 PCP: Lujean Amel, MD  Admit date: 06/22/2017 Discharge date: 06/29/2017  Admitted From: Home  Disposition: Home   Recommendations for Outpatient Follow-up:  1. Follow up with PCP in 1-2 weeks 2. Please obtain BMP/CBC in one week to monitor Cr/K and Hgb  3. Please obtain a CT chest in 4 week to monitor resolutions of symptoms  4. Follow up with pulmonology in 2-3 weeks   Discharge Condition: Stable  CODE STATUS: Full code  Diet recommendation: Heart Healthy   Brief/Interim Summary: [name] is a 83 y/o F with PMHx significant for COPD, bronchiectasis, vocal cord paralysis, HTN, GERD, hypothyroidism, depression/anxiety presented with cough, SOB and fever. In the ED patient was found to be hypoxic and CXR showing possible RLL pneumonia, patient was admitted for further treatment and evaluation. CT chest showed multifocal bronchopneumonia, bronchiectasis and COPD. Patient was treated with broad spectrum abx and IV solumedrol. Patient initially required high flow oxygen subsequently was weaned off to RA, although patient still needing oxigen with ambulation. Patient WBC decreased and patient remains afebrile. Patient was evaluated by PT whom recommended home health PT. Patient was discharge to follow up with PCP.   Subjective: Patient seen and examined while working with PT, Breathing well. Denies chest pain SOB and dizziness. No acute events last night.   Discharge Diagnoses/Hospital Course:  Principal Problem:   Acute on chronic respiratory failure with hypoxia (HCC) Active Problems:   Vocal cord paresis   BRONCHIECTASIS   Protein calorie malnutrition (HCC)   GERD (gastroesophageal reflux disease)   Lobar pneumonia (HCC)   LBBB (left bundle branch block)   Hyponatremia   Hypokalemia   Sepsis ([place])   Essential hypertension   COPD with acute exacerbation (HCC)   Depression  with anxiety   Hypothyroidism  Acute respiratory failure with hypoxia - Continues to improve  Multifactorial CT with multifocal bronchopneumonia, bronchiectasis and COPD  No PE, ABG unremarkable  Patient initially on Rocephin/Azithro switched to Zosyn for 3 days, transitioned to Augmentin BIB to complete total of 14 days of abx therapy, end date 07/06/17 WBC decreasing, predominant neutrophils, from infectious process and steroid use, check cbc in AM Initially treated with solumedrol, transitioned to prednisone will continue taper for 14 days Ambulatory O2 sats, shows that she needs O2 with ambulation  Strep Pnemo, legionella, MRSA PCR and respiratory panel negative  Continue home nebulizers Follow up with pulmonology in 2-3 weeks   Hypothyroid  TSH normal  Continue synthroid  LBBB  ? New on EKG, Troponin negative x 3 - no past records available  ECHO unremarkable EF 60-65%, No WMA  Sinus tachy improved  Follow up as outpatient.   HTN  BP stable  Continue home meds   Hypokalemia  Replete, started on K replacement therapy   Protein calorie malnutrition  Continue nutrition recommendations   HypoNa+  Resolved   All other chronic medical condition were stable during the hospitalization.  Patient was seen by physical therapy, recommending HH PT  On the day of the discharge the patient's vitals were stable, and no other acute medical condition were reported by patient. the patient was felt safe to be discharge to home   Discharge Instructions  You were cared for by a hospitalist during your hospital stay. If you have any questions about your discharge medications or the care you received while you were in the hospital after you are discharged, you  can call the unit and asked to speak with the hospitalist on call if the hospitalist that took care of you is not available. Once you are discharged, your primary care physician will handle any further medical issues. Please note  that NO REFILLS for any discharge medications will be authorized once you are discharged, as it is imperative that you return to your primary care physician (or establish a relationship with a primary care physician if you do not have one) for your aftercare needs so that they can reassess your need for medications and monitor your lab values.  Discharge Instructions    Call MD for:  difficulty breathing, headache or visual disturbances   Complete by:  As directed    Call MD for:  extreme fatigue   Complete by:  As directed    Call MD for:  hives   Complete by:  As directed    Call MD for:  persistant dizziness or light-headedness   Complete by:  As directed    Call MD for:  persistant nausea and vomiting   Complete by:  As directed    Call MD for:  redness, tenderness, or signs of infection (pain, swelling, redness, odor or green/yellow discharge around incision site)   Complete by:  As directed    Call MD for:  severe uncontrolled pain   Complete by:  As directed    Call MD for:  temperature >100.4   Complete by:  As directed    Diet - low sodium heart healthy   Complete by:  As directed    Increase activity slowly   Complete by:  As directed      Allergies as of 06/29/2017      Reactions   Asa [aspirin] Other (See Comments)   Spits up blood   Boniva [ibandronic Acid] Other (See Comments)   GI upset   Cystex [germanium] Nausea Only   Doxycycline Nausea Only, Hypertension      Medication List    STOP taking these medications   acetaminophen-codeine 300-30 MG tablet Commonly known as:  TYLENOL #3   amoxicillin-clavulanate 875-125 MG tablet Commonly known as:  AUGMENTIN Replaced by:  amoxicillin-clavulanate 250-62.5 MG/5ML suspension   beclomethasone 40 MCG/ACT inhaler Commonly known as:  QVAR   benzonatate 100 MG capsule Commonly known as:  TESSALON   chlorpheniramine-HYDROcodone 10-8 MG/5ML Suer Commonly known as:  TUSSIONEX PENNKINETIC ER   FIRST-DUKES  MOUTHWASH Susp   HYDROcodone-acetaminophen 5-325 MG tablet Commonly known as:  NORCO/VICODIN     TAKE these medications   AEROCHAMBER MV inhaler Use as instructed   ALPRAZolam 0.5 MG tablet Commonly known as:  XANAX Take 0.25-0.5 mg by mouth 2 (two) times daily as needed for anxiety.   amitriptyline 50 MG tablet Commonly known as:  ELAVIL Take 50 mg by mouth at bedtime.   amLODipine 5 MG tablet Commonly known as:  NORVASC Take 5 mg by mouth every evening.   amoxicillin-clavulanate 250-62.5 MG/5ML suspension Commonly known as:  AUGMENTIN Take 10 mLs (500 mg total) by mouth every 12 (twelve) hours for 7 days. Replaces:  amoxicillin-clavulanate 875-125 MG tablet   Azelastine-Fluticasone 137-50 MCG/ACT Susp Commonly known as:  DYMISTA Place 1-2 sprays into the nose daily. What changed:    when to take this  reasons to take this  Another medication with the same name was removed. Continue taking this medication, and follow the directions you see here.   beclomethasone 40 MCG/ACT inhaler Commonly known as:  QVAR REDIHALER  Inhale 2 puffs 2 (two) times daily into the lungs.   CALCIUM 1200 PO Take 1 capsule by mouth every other day.   celecoxib 200 MG capsule Commonly known as:  CELEBREX Take 1 capsule (200 mg total) by mouth 2 (two) times daily. What changed:    when to take this  reasons to take this   cholecalciferol 1000 units tablet Commonly known as:  VITAMIN D Take 1,000 Units by mouth daily.   GLUCOSAMINE CHONDR 1500 COMPLX Caps Take 1 capsule by mouth daily.   ipratropium 17 MCG/ACT inhaler Commonly known as:  ATROVENT HFA Inhale 2 puffs into the lungs every 6 (six) hours as needed for wheezing.   irbesartan 300 MG tablet Commonly known as:  AVAPRO Take 300 mg by mouth daily.   levothyroxine 88 MCG tablet Commonly known as:  SYNTHROID, LEVOTHROID Take 88 mcg by mouth daily before breakfast.   multivitamin tablet Take 1 tablet by mouth every  Monday, Wednesday, and Friday.   omeprazole 20 MG capsule Commonly known as:  PRILOSEC Take 1 capsule (20 mg total) by mouth daily.   potassium chloride 20 MEQ/15ML (10%) Soln Take 15 mLs (20 mEq total) by mouth daily.   predniSONE 10 MG tablet Commonly known as:  DELTASONE Take 4 tablets for 3 days; Take 3 tablets for 4 days; Take 2 tablets for 3 days; Take 1 tablet for 4 days   SPIRIVA HANDIHALER 18 MCG inhalation capsule Generic drug:  tiotropium INHALE THE CONTENTS OF 1 CAPSULE DAILY            Durable Medical Equipment  (From admission, onward)        Start     Ordered   06/28/17 1013  For home use only DME oxygen  Once    Question Answer Comment  Mode or (Route) Nasal cannula   Liters per Minute 2   Frequency Continuous (stationary and portable oxygen unit needed)   Oxygen conserving device Yes   Oxygen delivery system Gas      06/28/17 [place] [facility] Follow up.   Why:  They will do your home health care at your home Contact information: [place] [address] [phone]        Lujean Amel, MD. Schedule an appointment as soon as possible for a visit in 1 week(s).   Specialty:  Family Medicine Why:  Hospital follow up  Contact information: [address] [place] [address] [phone]          Allergies  Allergen Reactions  . Asa [Aspirin] Other (See Comments)    Spits up blood  . Boniva [Ibandronic Acid] Other (See Comments)    GI upset  . Cystex [Germanium] Nausea Only  . Doxycycline Nausea Only and Hypertension    Consultations:  None   Procedures/Studies: Ct Angio Chest Pe W Or Wo Contrast  Result Date: 06/24/2017 CLINICAL DATA:  Shortness of breath. EXAM: CT ANGIOGRAPHY CHEST WITH CONTRAST TECHNIQUE: Multidetector CT imaging of the chest was performed using the standard protocol during bolus administration of intravenous contrast.  Multiplanar CT image reconstructions and MIPs were obtained to evaluate the vascular anatomy. CONTRAST:  15mL ISOVUE-370 IOPAMIDOL (ISOVUE-370) INJECTION 76% COMPARISON:  None. FINDINGS: Cardiovascular: Mild cardiac enlargement. Aortic atherosclerosis. The main pulmonary artery is patent. No saddle embolus. No suspicious lobar or segmental pulmonary artery filling defects identified. Mediastinum/Nodes: The trachea appears patent and  is midline. Normal appearance of the esophagus. No mediastinal, hilar, axillary or supraclavicular adenopathy. Lungs/Pleura: Small to moderate bilateral pleural effusions. Bronchiectasis is identified bilaterally. There are also large bilateral upper lobe predominant areas of ground-glass attenuation and with interlobular septal thickening. Upper Abdomen: No acute abnormality. Musculoskeletal: No chest wall abnormality. No acute or significant osseous findings. Review of the MIP images confirms the above findings. IMPRESSION: 1. No evidence for acute pulmonary embolus. 2. Bilateral areas of bronchiectasis. 3. Bilateral multifocal areas of ground-glass attenuation and interlobular septal thickening is noted. The appearance is nonspecific but in the acute setting may represent multifocal bronchopneumonia. Other inflammatory and infectious etiologies are not excluded. Suggest followup imaging to ensure resolution. In the absence of interval clearing a high-resolution CT of the chest would be advised. 4.  Aortic Atherosclerosis (ICD10-I70.0). Electronically Signed   By: Kerby Moors M.D.   On: 06/24/2017 14:52   Dg Chest Portable 1 View  Result Date: 06/23/2017 CLINICAL DATA:  Shortness of breath tonight. EXAM: PORTABLE CHEST 1 VIEW COMPARISON:  Radiograph 12/08/2016 FINDINGS: Heart size upper normal. Atherosclerosis of the aortic arch. Symmetric patchy suprahilar opacities are new from prior exam. Background diffuse chronic interstitial thickening is stable. Again seen biapical  pleuroparenchymal scarring. Possible blunting of both costophrenic angles. No pneumothorax. Remote right rib fracture. IMPRESSION: Symmetric patchy suprahilar opacities, new from prior exam, may be pulmonary edema, infectious or inflammatory. Possible small pleural effusions. Background interstitial thickening and underlying chronic lung disease again seen. Electronically Signed   By: Jeb Levering M.D.   On: 06/23/2017 01:20    Discharge Exam: Vitals:   06/29/17 0821 06/29/17 1230  BP:  137/70  Pulse: (!) 103 (!) 107  Resp: 18 18  Temp:  98.2 F (36.8 C)  SpO2: 94% 94%   Vitals:   06/29/17 0639 06/29/17 0650 06/29/17 0821 06/29/17 1230  BP:    137/70  Pulse: 99 94 (!) 103 (!) 107  Resp:   18 18  Temp:    98.2 F (36.8 C)  TempSrc:    Oral  SpO2: 94% 92% 94% 94%  Weight:      Height:        General: NAD Cardiovascular: RRR, S1/S2 +, no rubs, no gallops Respiratory: Good air entry  Abdominal: Soft, NT, ND, bowel sounds + Extremities: no edema, no cyanosis   The results of significant diagnostics from this hospitalization (including imaging, microbiology, ancillary and laboratory) are listed below for reference.     Microbiology: Recent Results (from the past 240 hour(s))  Respiratory Panel by PCR     Status: None   Collection Time: 06/23/17  2:42 AM  Result Value Ref Range Status   Adenovirus NOT DETECTED NOT DETECTED Final   Coronavirus 229E NOT DETECTED NOT DETECTED Final   Coronavirus HKU1 NOT DETECTED NOT DETECTED Final   Coronavirus NL63 NOT DETECTED NOT DETECTED Final   Coronavirus OC43 NOT DETECTED NOT DETECTED Final   Metapneumovirus NOT DETECTED NOT DETECTED Final   Rhinovirus / Enterovirus NOT DETECTED NOT DETECTED Final   Influenza A NOT DETECTED NOT DETECTED Final   Influenza B NOT DETECTED NOT DETECTED Final   Parainfluenza Virus 1 NOT DETECTED NOT DETECTED Final   Parainfluenza Virus 2 NOT DETECTED NOT DETECTED Final   Parainfluenza Virus 3 NOT  DETECTED NOT DETECTED Final   Parainfluenza Virus 4 NOT DETECTED NOT DETECTED Final   Respiratory Syncytial Virus NOT DETECTED NOT DETECTED Final   Bordetella pertussis NOT DETECTED NOT DETECTED Final  Chlamydophila pneumoniae NOT DETECTED NOT DETECTED Final   Mycoplasma pneumoniae NOT DETECTED NOT DETECTED Final  Culture, blood (x 2)     Status: None   Collection Time: 06/23/17  4:32 AM  Result Value Ref Range Status   Specimen Description BLOOD RIGHT ANTECUBITAL  Final   Special Requests IN PEDIATRIC BOTTLE Blood Culture adequate volume  Final   Culture NO GROWTH 5 DAYS  Final   Report Status 06/28/2017 FINAL  Final  Culture, blood (x 2)     Status: None   Collection Time: 06/23/17  4:32 AM  Result Value Ref Range Status   Specimen Description BLOOD  Final   Special Requests AEROBIC BOTTLE ONLY Blood Culture adequate volume  Final   Culture NO GROWTH 5 DAYS  Final   Report Status 06/28/2017 FINAL  Final  Culture, sputum-assessment     Status: None   Collection Time: 06/23/17  7:15 PM  Result Value Ref Range Status   Specimen Description EXPECTORATED SPUTUM  Final   Special Requests NONE  Final   Sputum evaluation THIS SPECIMEN IS ACCEPTABLE FOR SPUTUM CULTURE  Final   Report Status 06/24/2017 FINAL  Final  Culture, respiratory (NON-Expectorated)     Status: None   Collection Time: 06/23/17  7:15 PM  Result Value Ref Range Status   Specimen Description EXPECTORATED SPUTUM  Final   Special Requests NONE Reflexed from [id number]  Final   Gram Stain   Final    FEW WBC PRESENT, PREDOMINANTLY PMN RARE SQUAMOUS EPITHELIAL CELLS PRESENT RARE GRAM POSITIVE COCCI    Culture MODERATE CANDIDA ALBICANS  Final   Report Status 06/27/2017 FINAL  Final  MRSA PCR Screening     Status: None   Collection Time: 06/24/17  3:53 PM  Result Value Ref Range Status   MRSA by PCR NEGATIVE NEGATIVE Final    Comment:        The GeneXpert MRSA Assay (FDA approved for NASAL specimens only), is one  component of a comprehensive MRSA colonization surveillance program. It is not intended to diagnose MRSA infection nor to guide or monitor treatment for MRSA infections.      Labs: BNP (last 3 results) Recent Labs    06/23/17 0151  BNP 884.1*   Basic Metabolic Panel: Recent Labs  Lab 06/23/17 0240  06/25/17 0441 06/26/17 0545 06/26/17 1535 06/27/17 0817 06/28/17 0804 06/29/17 0623  NA  --    < > 136 135  --  132* 134* 135  K  --    < > 3.7 5.7* 3.3* 4.0 2.8* 2.8*  CL  --    < > 92* 91*  --  88* 89* 93*  CO2  --    < > 33* 33*  --  33* 33* 35*  GLUCOSE  --    < > 175* 164*  --  179* 150* 79  BUN  --    < > 11 15  --  11 13 12   CREATININE  --    < > 0.54 0.52  --  0.50 0.59 0.51  CALCIUM  --    < > 8.7* 8.5*  --  8.7* 8.6* 8.4*  MG 2.3  --  1.8  --   --  2.0 2.0  --    < > = values in this interval not displayed.   Liver Function Tests: Recent Labs  Lab 06/25/17 0441  AST 64*  ALT 52  ALKPHOS 134*  BILITOT 0.3  PROT 7.0  ALBUMIN  3.0*   No results for input(s): LIPASE, AMYLASE in the last 168 hours. No results for input(s): AMMONIA in the last 168 hours. CBC: Recent Labs  Lab 06/25/17 0441 06/26/17 0545 06/27/17 0817 06/28/17 0804 06/29/17 0623  WBC 47.4* 30.2* 25.9* 19.5* 17.7*  NEUTROABS 45.5* 28.4* 24.3* 16.8* 12.9*  HGB 13.8 13.4 14.9 14.0 13.3  HCT 41.9 41.1 44.6 41.8 40.4  MCV 87.7 86.9 86.9 84.4 85.1  PLT 461* 443* 471* 481* 408*   Cardiac Enzymes: Recent Labs  Lab 06/23/17 0004 06/23/17 0240 06/23/17 0823 06/23/17 1419  TROPONINI <0.03 <0.03 <0.03 <0.03   BNP: Invalid input(s): POCBNP CBG: No results for input(s): GLUCAP in the last 168 hours. D-Dimer No results for input(s): DDIMER in the last 72 hours. Hgb A1c No results for input(s): HGBA1C in the last 72 hours. Lipid Profile No results for input(s): CHOL, HDL, LDLCALC, TRIG, CHOLHDL, LDLDIRECT in the last 72 hours. Thyroid function studies No results for input(s): TSH,  T4TOTAL, T3FREE, THYROIDAB in the last 72 hours.  Invalid input(s): FREET3 Anemia work up No results for input(s): VITAMINB12, FOLATE, FERRITIN, TIBC, IRON, RETICCTPCT in the last 72 hours. Urinalysis No results found for: COLORURINE, APPEARANCEUR, [place], [place], [place], [place], [place], [place], [place], [place], NITRITE, LEUKOCYTESUR Sepsis Labs Invalid input(s): PROCALCITONIN,  WBC,  LACTICIDVEN Microbiology Recent Results (from the past 240 hour(s))  Respiratory Panel by PCR     Status: None   Collection Time: 06/23/17  2:42 AM  Result Value Ref Range Status   Adenovirus NOT DETECTED NOT DETECTED Final   Coronavirus 229E NOT DETECTED NOT DETECTED Final   Coronavirus HKU1 NOT DETECTED NOT DETECTED Final   Coronavirus NL63 NOT DETECTED NOT DETECTED Final   Coronavirus OC43 NOT DETECTED NOT DETECTED Final   Metapneumovirus NOT DETECTED NOT DETECTED Final   Rhinovirus / Enterovirus NOT DETECTED NOT DETECTED Final   Influenza A NOT DETECTED NOT DETECTED Final   Influenza B NOT DETECTED NOT DETECTED Final   Parainfluenza Virus 1 NOT DETECTED NOT DETECTED Final   Parainfluenza Virus 2 NOT DETECTED NOT DETECTED Final   Parainfluenza Virus 3 NOT DETECTED NOT DETECTED Final   Parainfluenza Virus 4 NOT DETECTED NOT DETECTED Final   Respiratory Syncytial Virus NOT DETECTED NOT DETECTED Final   Bordetella pertussis NOT DETECTED NOT DETECTED Final   Chlamydophila pneumoniae NOT DETECTED NOT DETECTED Final   Mycoplasma pneumoniae NOT DETECTED NOT DETECTED Final  Culture, blood (x 2)     Status: None   Collection Time: 06/23/17  4:32 AM  Result Value Ref Range Status   Specimen Description BLOOD RIGHT ANTECUBITAL  Final   Special Requests IN PEDIATRIC BOTTLE Blood Culture adequate volume  Final   Culture NO GROWTH 5 DAYS  Final   Report Status 06/28/2017 FINAL  Final  Culture, blood (x 2)     Status: None   Collection Time: 06/23/17  4:32 AM  Result Value Ref Range Status    Specimen Description BLOOD  Final   Special Requests AEROBIC BOTTLE ONLY Blood Culture adequate volume  Final   Culture NO GROWTH 5 DAYS  Final   Report Status 06/28/2017 FINAL  Final  Culture, sputum-assessment     Status: None   Collection Time: 06/23/17  7:15 PM  Result Value Ref Range Status   Specimen Description EXPECTORATED SPUTUM  Final   Special Requests NONE  Final   Sputum evaluation THIS SPECIMEN IS ACCEPTABLE FOR SPUTUM CULTURE  Final   Report Status 06/24/2017 FINAL  Final  Culture, respiratory (NON-Expectorated)     Status: None   Collection Time: 06/23/17  7:15 PM  Result Value Ref Range Status   Specimen Description EXPECTORATED SPUTUM  Final   Special Requests NONE Reflexed from [id number]  Final   Gram Stain   Final    FEW WBC PRESENT, PREDOMINANTLY PMN RARE SQUAMOUS EPITHELIAL CELLS PRESENT RARE GRAM POSITIVE COCCI    Culture MODERATE CANDIDA ALBICANS  Final   Report Status 06/27/2017 FINAL  Final  MRSA PCR Screening     Status: None   Collection Time: 06/24/17  3:53 PM  Result Value Ref Range Status   MRSA by PCR NEGATIVE NEGATIVE Final    Comment:        The GeneXpert MRSA Assay (FDA approved for NASAL specimens only), is one component of a comprehensive MRSA colonization surveillance program. It is not intended to diagnose MRSA infection nor to guide or monitor treatment for MRSA infections.    Time coordinating discharge:  35 minutes  SIGNED:  Chipper Oman, MD  [facility] 06/29/2017, 4:02 PM  Pager please text page via  [email or web address] Password [email or web address]

## 2017-06-29 NOTE — Progress Notes (Addendum)
Pt continues to desat into the 70s while getting up to bedside commode. Pt  desats <88% on room air after falling asleep with daughter in room. Pt placed on 0.5L [place] while sleeping. Will continue to monitor.

## 2017-06-29 NOTE — Progress Notes (Signed)
Physical Therapy Treatment Patient Details Name: [name] MRN: [id number] DOB: June 09, 1931 Today's Date: 06/29/2017    History of Present Illness Pt admitted on 06/22/17 with acute on chronic respiratory failure with hypoxia. PMH - COPD, HTN.    PT Comments    Patient making progress toward goals.  Patient able to ambulate 36' with RW on room air with O2 sats dropping only to 89%.  Able to return to 92% in several seconds with pursed-lip breathing.  Improved.   Follow Up Recommendations  Home health PT;Supervision - Intermittent     Equipment Recommendations  None recommended by PT    Recommendations for Other Services       Precautions / Restrictions Precautions Precautions: Fall Precaution Comments: Watch SpO2 Restrictions Weight Bearing Restrictions: No    Mobility  Bed Mobility Overal bed mobility: Independent                Transfers Overall transfer level: Needs assistance Equipment used: Rolling walker (2 wheeled);None Transfers: Sit to/from Stand Sit to Stand: Supervision         General transfer comment: Supervision for safety only.  Ambulation/Gait Ambulation/Gait assistance: Supervision Ambulation Distance (Feet): 70 Feet Assistive device: Rolling walker (2 wheeled) Gait Pattern/deviations: Step-through pattern;Decreased stride length;Trunk flexed Gait velocity: decreased Gait velocity interpretation: Below normal speed for age/gender General Gait Details: Patient able to manipulate RW on her own.  Gait fairly stable with RW.  Patient ambulated on RA with O2 sats dropping to 89% only.  Talked with patient about self-monitoring gait, knowing when to stop for break.  Following gait, patient stood and ambulated 3' to sink with min assist to brush teeth.   Stairs            Wheelchair Mobility    Modified Rankin (Stroke Patients Only)       Balance Overall balance assessment: Needs assistance         Standing balance  support: No upper extremity supported Standing balance-Leahy Scale: Fair                              Cognition Arousal/Alertness: Awake/alert Behavior During Therapy: WFL for tasks assessed/performed Overall Cognitive Status: Within Functional Limits for tasks assessed                                        Exercises      General Comments        Pertinent Vitals/Pain Pain Assessment: No/denies pain    Home Living                      Prior Function            PT Goals (current goals can now be found in the care plan section) Acute Rehab PT Goals Patient Stated Goal: return home Progress towards PT goals: Progressing toward goals    Frequency    Min 3X/week      PT Plan Current plan remains appropriate    Co-evaluation              AM-PAC PT "6 Clicks" Daily Activity  Outcome Measure  Difficulty turning over in bed (including adjusting bedclothes, sheets and blankets)?: None Difficulty moving from lying on back to sitting on the side of the bed? : None Difficulty sitting down on and standing  up from a chair with arms (e.g., wheelchair, bedside commode, etc,.)?: None Help needed moving to and from a bed to chair (including a wheelchair)?: None Help needed walking in hospital room?: A Little Help needed climbing 3-5 steps with a railing? : A Little 6 Click Score: 22    End of Session Equipment Utilized During Treatment: Gait belt Activity Tolerance: Patient tolerated treatment well Patient left: in bed;with call bell/phone within reach;with family/visitor present Nurse Communication: (O2 sats with gait on RA) PT Visit Diagnosis: Unsteadiness on feet (R26.81);History of falling (Z91.81);Muscle weakness (generalized) (M62.81)     Time: 1030-1314 PT Time Calculation (min) (ACUTE ONLY): 18 min  Charges:  $Gait Training: 8-22 mins                    G Codes:       Carita Pian. Sanjuana Kava, [facility] Acute Rehab  Services Pager [place] 06/29/2017, 2:42 PM

## 2017-07-08 DIAGNOSIS — F329 Major depressive disorder, single episode, unspecified: Secondary | ICD-10-CM | POA: Diagnosis not present

## 2017-07-08 DIAGNOSIS — E46 Unspecified protein-calorie malnutrition: Secondary | ICD-10-CM | POA: Diagnosis not present

## 2017-07-08 DIAGNOSIS — I447 Left bundle-branch block, unspecified: Secondary | ICD-10-CM | POA: Diagnosis not present

## 2017-07-08 DIAGNOSIS — I10 Essential (primary) hypertension: Secondary | ICD-10-CM | POA: Diagnosis not present

## 2017-07-08 DIAGNOSIS — J9621 Acute and chronic respiratory failure with hypoxia: Secondary | ICD-10-CM | POA: Diagnosis not present

## 2017-07-08 DIAGNOSIS — J441 Chronic obstructive pulmonary disease with (acute) exacerbation: Secondary | ICD-10-CM | POA: Diagnosis not present

## 2017-07-08 DIAGNOSIS — J3800 Paralysis of vocal cords and larynx, unspecified: Secondary | ICD-10-CM | POA: Diagnosis not present

## 2017-07-08 DIAGNOSIS — F419 Anxiety disorder, unspecified: Secondary | ICD-10-CM | POA: Diagnosis not present

## 2017-07-08 DIAGNOSIS — E039 Hypothyroidism, unspecified: Secondary | ICD-10-CM | POA: Diagnosis not present

## 2017-07-08 DIAGNOSIS — Z8701 Personal history of pneumonia (recurrent): Secondary | ICD-10-CM | POA: Diagnosis not present

## 2017-07-09 ENCOUNTER — Other Ambulatory Visit: Payer: Self-pay

## 2017-07-09 DIAGNOSIS — J479 Bronchiectasis, uncomplicated: Secondary | ICD-10-CM | POA: Diagnosis not present

## 2017-07-09 DIAGNOSIS — E441 Mild protein-calorie malnutrition: Secondary | ICD-10-CM | POA: Diagnosis not present

## 2017-07-09 DIAGNOSIS — J449 Chronic obstructive pulmonary disease, unspecified: Secondary | ICD-10-CM | POA: Diagnosis not present

## 2017-07-09 DIAGNOSIS — Z79899 Other long term (current) drug therapy: Secondary | ICD-10-CM | POA: Diagnosis not present

## 2017-07-09 DIAGNOSIS — E039 Hypothyroidism, unspecified: Secondary | ICD-10-CM | POA: Diagnosis not present

## 2017-07-09 DIAGNOSIS — I10 Essential (primary) hypertension: Secondary | ICD-10-CM | POA: Diagnosis not present

## 2017-07-09 NOTE — Patient Outreach (Signed)
[facility]) Care Management  07/09/2017  [name] 04/03/1939 [id number]   EMMI: pneumonia Referral date: 07/09/17 Referral source:EMMI pneumonia red alert Referral reason: Been confused: YES, swelloing in hands/ feet or changes in weight; YES  Day # 2  Telephone call to patient regarding EMMI pneumonia red alert. HIPAA verified with patient. Patient gave verbal authorization to speak with her daughter [name] regarding her health information.  Discussed EMMI pneumonia program with patient and daughter.  Daughter denies patient having any confusion. Daughter states patient has, "slight swelling in her feet."  Daughter states patient is scheduled to see her primary MD today and will discuss the swelling.  Daughter states patient does not have a follow up with her pulmonary doctor as of yet but will schedule. Daughter states patient has all of her medications and takes her medications as prescribed. Daughter states patient has round the clock care being provided from her family. Daughter states patients oxygen was delivered on 06/30/17.  States patient uses her oxygen at 2L as needed and at night. Daughter states patient has a pulse oximetry.  Daughter states advance home care services started on yesterday 07/08/17.  Daughter states patient will have home health nurse and physical therapy.  RNCM discussed signs/ symptoms of pneumonia.  RNCM advised patient/daughter to notify MD of any changes in condition prior to scheduled appointment. RNCM verified patient/daughter  aware of 911 services for urgent/ emergent needs. RNCM discussed [facility] care management services with patients daughter. Daughter verbally agreed patient had no additional needs at this time.  Daughter verbally agreed to received [facility] care management brochure for future reference.  Daughter verbally agreed to ongoing EMMI pneumonia calls.  Patient expressed her appreciation/ gratitude regarding the care  she received at [facility].   ASSESSMENT:  Per patients medical record 06/29/17: Admit date: 06/22/2017 Discharge date: 06/29/2017 Discharge Diagnoses/Hospital Course:  Principal Problem:   Acute on chronic respiratory failure with hypoxia [facility]) Active Problems:   Vocal cord paresis   BRONCHIECTASIS   Protein calorie malnutrition (HCC)   GERD (gastroesophageal reflux disease)   Lobar pneumonia (HCC)   LBBB (left bundle branch block)   Hyponatremia   Hypokalemia   Sepsis ([place])   Essential hypertension   COPD with acute exacerbation (HCC)   Depression with anxiety   Hypothyroidism  83 y/o F with PMHx significant for COPD, bronchiectasis, vocal cord paralysis, HTN, GERD, hypothyroidism, depression/anxiety   PLAN: RNCM will refer patient to care management assistant to close due to patient being assessed and having no further needs.  RNCM will mail patient [facility] care management outreach letter/ brochure.   Quinn Plowman RN,BSN,CCM [facility] Telephonic  [phone]

## 2017-07-11 ENCOUNTER — Other Ambulatory Visit: Payer: Self-pay

## 2017-07-11 DIAGNOSIS — I10 Essential (primary) hypertension: Secondary | ICD-10-CM | POA: Diagnosis not present

## 2017-07-11 DIAGNOSIS — J441 Chronic obstructive pulmonary disease with (acute) exacerbation: Secondary | ICD-10-CM | POA: Diagnosis not present

## 2017-07-11 DIAGNOSIS — J9621 Acute and chronic respiratory failure with hypoxia: Secondary | ICD-10-CM | POA: Diagnosis not present

## 2017-07-11 DIAGNOSIS — J3800 Paralysis of vocal cords and larynx, unspecified: Secondary | ICD-10-CM | POA: Diagnosis not present

## 2017-07-11 DIAGNOSIS — Z8701 Personal history of pneumonia (recurrent): Secondary | ICD-10-CM | POA: Diagnosis not present

## 2017-07-11 DIAGNOSIS — I447 Left bundle-branch block, unspecified: Secondary | ICD-10-CM | POA: Diagnosis not present

## 2017-07-11 NOTE — Patient Outreach (Signed)
[facility]) Care Management  07/11/2017  [name] [place] February 12, 1931 [id number]  EMMI: pneumonia Referral date: 07/11/17 Referral source:EMMI pneumonia red alert Referral reason:  swelloing in hands/ feet or changes in weight; YES  Day # 5 Attempt #1  Telephone call to patient regarding EMMI pneumonia red alert. Unable to reach patient. HIPAA compliant voice message left for patients caregiver/ daughter with call back phone number.  PLAN; RNCM will attempt 2nd telephone call to patient within 3 business days.   Quinn Plowman RN,BSN,CCM [facility] Telephonic  [phone]

## 2017-07-12 ENCOUNTER — Other Ambulatory Visit: Payer: Self-pay

## 2017-07-12 NOTE — Patient Outreach (Signed)
[facility]) Care Management  07/12/2017  [name] 04-Mar-1931 [id number]    EMMI: pneumonia Referral date: 07/09/17 Referral source:EMMI pneumonia red alert Referral reason: swelling in hands/ feet or changes in weight; YES  Day # 5  Telephone call to patient / daughter regarding EMMI pneumonia red alert. RNCM spoke with patients daughter, [name].  HIPAA verified for patient by daughter.  Daughter states patient is doing fine. Daughter states patient was seen by her doctor on Monday 07/09/17 and the swelling in her feet was addressed. Daughter states the doctor was not concerned about the mild swelling in patients feet.  Daughter denies any changes in patients treatment plan/ medications. Daughter states patient is not having any new symptoms. Daughter states patient has a home health nurse seeing her 2 times per week. Daughter denies patient having any further needs at this time and request EMMI pneumonia calls be discontinued.  RNCM confirmed patient/ daughter has [facility] contact information and 24 hour nurse advise line number.   PLAN:  RNCM will refer patient to care management assistant to close and to discontinue EMMI pneumonia calls.   Quinn Plowman RN,BSN,CCM [facility] Telephonic  [phone]

## 2017-07-13 ENCOUNTER — Telehealth: Payer: Self-pay | Admitting: Internal Medicine

## 2017-07-13 DIAGNOSIS — J9621 Acute and chronic respiratory failure with hypoxia: Secondary | ICD-10-CM | POA: Diagnosis not present

## 2017-07-13 DIAGNOSIS — J3800 Paralysis of vocal cords and larynx, unspecified: Secondary | ICD-10-CM | POA: Diagnosis not present

## 2017-07-13 DIAGNOSIS — I447 Left bundle-branch block, unspecified: Secondary | ICD-10-CM | POA: Diagnosis not present

## 2017-07-13 DIAGNOSIS — I10 Essential (primary) hypertension: Secondary | ICD-10-CM | POA: Diagnosis not present

## 2017-07-13 DIAGNOSIS — J441 Chronic obstructive pulmonary disease with (acute) exacerbation: Secondary | ICD-10-CM | POA: Diagnosis not present

## 2017-07-13 DIAGNOSIS — Z8701 Personal history of pneumonia (recurrent): Secondary | ICD-10-CM | POA: Diagnosis not present

## 2017-07-13 NOTE — Telephone Encounter (Signed)
Spoke with patient regarding hospital follow up on 07/17/17 at 10am with CY Pt was requesting a later appt that day, CY appt is full Pt advised she is wanting both daughters with her on appt Pt decided to keep original appt at 10am 07/17/17 with CY no changes Nothing further needed

## 2017-07-14 DIAGNOSIS — I447 Left bundle-branch block, unspecified: Secondary | ICD-10-CM | POA: Diagnosis not present

## 2017-07-14 DIAGNOSIS — I10 Essential (primary) hypertension: Secondary | ICD-10-CM | POA: Diagnosis not present

## 2017-07-14 DIAGNOSIS — Z8701 Personal history of pneumonia (recurrent): Secondary | ICD-10-CM | POA: Diagnosis not present

## 2017-07-14 DIAGNOSIS — J3800 Paralysis of vocal cords and larynx, unspecified: Secondary | ICD-10-CM | POA: Diagnosis not present

## 2017-07-14 DIAGNOSIS — J441 Chronic obstructive pulmonary disease with (acute) exacerbation: Secondary | ICD-10-CM | POA: Diagnosis not present

## 2017-07-14 DIAGNOSIS — J9621 Acute and chronic respiratory failure with hypoxia: Secondary | ICD-10-CM | POA: Diagnosis not present

## 2017-07-16 ENCOUNTER — Other Ambulatory Visit: Payer: Self-pay | Admitting: Internal Medicine

## 2017-07-16 DIAGNOSIS — I10 Essential (primary) hypertension: Secondary | ICD-10-CM | POA: Diagnosis not present

## 2017-07-16 DIAGNOSIS — Z8701 Personal history of pneumonia (recurrent): Secondary | ICD-10-CM | POA: Diagnosis not present

## 2017-07-16 DIAGNOSIS — I447 Left bundle-branch block, unspecified: Secondary | ICD-10-CM | POA: Diagnosis not present

## 2017-07-16 DIAGNOSIS — J9621 Acute and chronic respiratory failure with hypoxia: Secondary | ICD-10-CM | POA: Diagnosis not present

## 2017-07-16 DIAGNOSIS — J3800 Paralysis of vocal cords and larynx, unspecified: Secondary | ICD-10-CM | POA: Diagnosis not present

## 2017-07-16 DIAGNOSIS — J441 Chronic obstructive pulmonary disease with (acute) exacerbation: Secondary | ICD-10-CM | POA: Diagnosis not present

## 2017-07-17 ENCOUNTER — Ambulatory Visit (INDEPENDENT_AMBULATORY_CARE_PROVIDER_SITE_OTHER)
Admission: RE | Admit: 2017-07-17 | Discharge: 2017-07-17 | Disposition: A | Discharge status: Home / Self Care | Payer: Medicare Other | Source: Ambulatory Visit | Attending: Internal Medicine | Admitting: Internal Medicine

## 2017-07-17 ENCOUNTER — Encounter: Payer: Self-pay | Admitting: Internal Medicine

## 2017-07-17 ENCOUNTER — Other Ambulatory Visit (INDEPENDENT_AMBULATORY_CARE_PROVIDER_SITE_OTHER): Payer: Medicare Other

## 2017-07-17 ENCOUNTER — Ambulatory Visit (INDEPENDENT_AMBULATORY_CARE_PROVIDER_SITE_OTHER): Payer: Medicare Other | Admitting: Internal Medicine

## 2017-07-17 ENCOUNTER — Telehealth: Payer: Self-pay | Admitting: Internal Medicine

## 2017-07-17 VITALS — BP 112/70 | HR 82 | Wt 90.2 lb

## 2017-07-17 DIAGNOSIS — J471 Bronchiectasis with (acute) exacerbation: Secondary | ICD-10-CM

## 2017-07-17 DIAGNOSIS — J9621 Acute and chronic respiratory failure with hypoxia: Secondary | ICD-10-CM | POA: Diagnosis not present

## 2017-07-17 DIAGNOSIS — J158 Pneumonia due to other specified bacteria: Secondary | ICD-10-CM

## 2017-07-17 DIAGNOSIS — J479 Bronchiectasis, uncomplicated: Secondary | ICD-10-CM

## 2017-07-17 LAB — CBC WITH DIFFERENTIAL/PLATELET
BASOS ABS: 0.1 10*3/uL (ref 0.0–0.1)
Basophils Relative: 0.7 % (ref 0.0–3.0)
Eosinophils Absolute: 1.2 10*3/uL — ABNORMAL HIGH (ref 0.0–0.7)
Eosinophils Relative: 12.2 % — ABNORMAL HIGH (ref 0.0–5.0)
HCT: 37.9 % (ref 36.0–46.0)
Hemoglobin: 12.4 g/dL (ref 12.0–15.0)
LYMPHS ABS: 1.1 10*3/uL (ref 0.7–4.0)
Lymphocytes Relative: 11.6 % — ABNORMAL LOW (ref 12.0–46.0)
MCHC: 32.8 g/dL (ref 30.0–36.0)
MCV: 87.2 fl (ref 78.0–100.0)
MONO ABS: 0.8 10*3/uL (ref 0.1–1.0)
MONOS PCT: 8.6 % (ref 3.0–12.0)
NEUTROS PCT: 66.9 % (ref 43.0–77.0)
Neutro Abs: 6.5 10*3/uL (ref 1.4–7.7)
Platelets: 299 10*3/uL (ref 150.0–400.0)
RBC: 4.35 Mil/uL (ref 3.87–5.11)
RDW: 15.3 % (ref 11.5–15.5)
WBC: 9.8 10*3/uL (ref 4.0–10.5)

## 2017-07-17 LAB — BASIC METABOLIC PANEL
BUN: 9 mg/dL (ref 6–23)
CALCIUM: 9 mg/dL (ref 8.4–10.5)
CO2: 33 mEq/L — ABNORMAL HIGH (ref 19–32)
Chloride: 92 mEq/L — ABNORMAL LOW (ref 96–112)
Creatinine, Ser: 0.56 mg/dL (ref 0.40–1.20)
GFR: 109.36 mL/min (ref >60.00)
GLUCOSE: 102 mg/dL — AB (ref 70–99)
Potassium: 3.8 mEq/L (ref 3.5–5.1)
Sodium: 131 mEq/L — ABNORMAL LOW (ref 135–145)

## 2017-07-17 MED ORDER — FIRST-DUKES MOUTHWASH MT SUSP: OROMUCOSAL | 1 refills | Status: DC

## 2017-07-17 NOTE — Patient Instructions (Signed)
Order- schedule CT chest, hi res, no contrast     Dx bronchiectasis            Labs-    CBC w diff, BMET       Dx bronchiectasis  Ok to try on and off the Mucinex to decide if it helps you  Please call as needed

## 2017-07-17 NOTE — Assessment & Plan Note (Signed)
Recent pneumonia was likely an aspiration event.  We reemphasized aspiration precautions. Plan-update CT scan as requested in discharge plan, along with CBC and chemistry.

## 2017-07-17 NOTE — Telephone Encounter (Signed)
Spoke with pt and advised rx sent to pharmacy. Nothing further is needed.   

## 2017-07-17 NOTE — Telephone Encounter (Signed)
Ok [employer] Magic Mouthwash   150 ml,   Swish and swallow 5 ml, 4 times daily

## 2017-07-17 NOTE — Assessment & Plan Note (Signed)
Feels significantly better with oxygen even at rest.  We will reassess at next visit.

## 2017-07-17 NOTE — Progress Notes (Signed)
Patient ID: [name], female    DOB: Feb 12, 1931, 84 y.o.   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  coughing up green/dark gray mucus. wheezing, squeaking sounds in chest.    For the past week has noted increased cough, increased dyspnea on exertion and more chest rattle. Denies fever. Sputum more green. CXR 07/08/2015 IMPRESSION: Chronic changes bilaterally stable from the prior exam. The nodular appearing density in the periphery of the left upper lobe is stable but shows a less nodular appearance. Continued followup is recommended. Electronically Signed  By: Inez Catalina M.D.  On: 07/08/2015 15:04  07/17/17- 84 year old female never smoker followed for bronchiectasis with history MAIC-treated, intermittent hemoptysis, complicated by chronic vocal cord paresis, Hashimoto's thyroiditis, GERD, insomnia  Hosp 12/14-21/18- pneumonia w acute hypoxic respiratory failure. Discharged on augmentin, pred taper, O2.    O2 2L/ Advanced ----Post hospital visit; Was in hospital for PNA. Pt feels better but still continues to have weakness.  Arrival Sat today 99% on 2L O2 Daughters are with her.  She has been very weak.  Cough is productive without fever.  Takes fractions of a Xanax tablet to help her sleep.Marland Kitchen UTD flu/ pvax CTa chest 06/24/17 IMPRESSION: 1. No evidence for acute pulmonary embolus. 2. Bilateral areas of  bronchiectasis. 3. Bilateral multifocal areas of ground-glass attenuation and interlobular septal thickening is noted. The appearance is nonspecific but in the acute setting may represent multifocal bronchopneumonia. Other inflammatory and infectious etiologies are not excluded. Suggest followup imaging to ensure resolution. In the absence of interval clearing a high-resolution CT of the chest would be advised. 4.  Aortic Atherosclerosis (ICD10-I70.0).  ROS-see HPI + = positive Constitutional:   No-   weight loss, +night sweats, fevers, chills, fatigue, lassitude. HEENT:   No-  headaches, difficulty swallowing, tooth/dental problems, sore throat, + chronic hoarseness      No-  sneezing, itching, ear ache, nasal congestion, post nasal drip,  CV:  No-   chest pain, orthopnea, PND, swelling in lower extremities, anasarca,  dizziness, palpitations Resp: +  shortness of breath with exertion or at rest.              + productive cough,  No non-productive cough,  + coughing up of blood.              No-   change in color of mucus.  No- wheezing.   Skin: No-   rash or lesions. GI:  + heartburn, indigestion, No-abdominal pain, nausea, No-vomiting,  GU: MS:  No-   joint pain or swelling. + Back pain   Neuro-     nothing unusual Psych:  No- change in mood or affect. No depression or anxiety.  No memory loss.  Objective:   Physical Exam General- Alert, Oriented, Affect-appropriate, Distress- none acute  Slender, talkative, + 2 2 L Skin- rash-none, lesions- none, excoriation- none Lymphadenopathy- none Head- atraumatic            Eyes- Gross vision intact, PERRLA, conjunctivae clear secretions            Ears- Hearing, canals normal  Nose- Clear, No-Septal dev, mucus, polyps, erosion, perforation             Throat- Mallampati II , mucosa clear/ dry , drainage- none, tonsils- atrophy, +chronic hoarseness Neck- flexible , trachea midline, no stridor , thyroid nl, carotid no  bruit Chest - symmetrical excursion , unlabored           Heart/CV- RRR now , no murmur , no gallop  , no rub, nl s1 s2                           - JVD- none , edema- none, stasis changes- none, varices- none           Lung- + rhonchi right lung, unlabored, wheeze- none, cough+light , dullness-none,  rub- none           Chest wall-  Abd-  Br/ Gen/ Rectal- Not done, not indicated Extrem- cyanosis- none, clubbing, none, atrophy- none, strength- nl Neuro- grossly intact to observation

## 2017-07-17 NOTE — Telephone Encounter (Signed)
Spoke with pt, needs a RX for magic mouthwash. She states her mouth and cheeks are burning and it is effecting her appetite. Her mouth is extremely dry and but tried to use salt water rinses to help with symptoms. She meant to mention this to CY when she was there but she forgot. CY please advise.   Current Outpatient Medications on File Prior to Visit  Medication Sig Dispense Refill  . ALPRAZolam (XANAX) 0.5 MG tablet Take 0.25-0.5 mg by mouth 2 (two) times daily as needed for anxiety.    Marland Kitchen amitriptyline (ELAVIL) 50 MG tablet Take 50 mg by mouth at bedtime.     Marland Kitchen amLODipine (NORVASC) 5 MG tablet Take 5 mg by mouth every evening.     . Azelastine-Fluticasone (DYMISTA) 137-50 MCG/ACT SUSP Place 1-2 sprays into the nose daily. (Patient taking differently: Place 1-2 sprays into the nose daily as needed (for allergies). ) 69 g 3  . beclomethasone (QVAR REDIHALER) 40 MCG/ACT inhaler Inhale 2 puffs 2 (two) times daily into the lungs. 3 Inhaler 3  . Calcium Carbonate-Vit D-Min (CALCIUM 1200 PO) Take 1 capsule by mouth every other day.     . celecoxib (CELEBREX) 200 MG capsule Take 1 capsule (200 mg total) by mouth 2 (two) times daily. (Patient taking differently: Take 200 mg by mouth 2 (two) times daily as needed for mild pain. ) 60 capsule 0  . cholecalciferol (VITAMIN D) 1000 UNITS tablet Take 1,000 Units by mouth daily.    . Glucosamine-Chondroit-Vit C-Mn (GLUCOSAMINE CHONDR 1500 COMPLX) CAPS Take 1 capsule by mouth daily.     Marland Kitchen ipratropium (ATROVENT HFA) 17 MCG/ACT inhaler Inhale 2 puffs into the lungs every 6 (six) hours as needed for wheezing. 1 Inhaler 11  . irbesartan (AVAPRO) 300 MG tablet Take 300 mg by mouth daily.    Marland Kitchen levothyroxine (SYNTHROID, LEVOTHROID) 88 MCG tablet Take 88 mcg by mouth daily before breakfast.     . Multiple Vitamin (MULTIVITAMIN) tablet Take 1 tablet by mouth every Monday, Wednesday, and Friday.     Marland Kitchen omeprazole (PRILOSEC) 20 MG capsule Take 1 capsule (20 mg total) by  mouth daily. 90 capsule 4  . Spacer/Aero-Holding Chambers (AEROCHAMBER MV) inhaler Use as instructed 1 each 0  . SPIRIVA HANDIHALER 18 MCG inhalation capsule INHALE THE CONTENTS OF 1 CAPSULE DAILY 90 capsule 0   No current facility-administered medications on file prior to visit.    Allergies  Allergen Reactions  . Ambien [Zolpidem]     Foggy mind-gets up and walks around (pt does not remember anything of the event)  . Asa [Aspirin] Other (See Comments)    Spits up blood  . Boniva [Ibandronic Acid] Other (See Comments)    GI upset  . Cystex [Germanium] Nausea Only  . Doxycycline Nausea Only and Hypertension

## 2017-07-17 NOTE — Assessment & Plan Note (Signed)
Her primary risk is chronic low-grade aspiration due to her vocal cord paralysis. Plan-high resolution CT chest for comparison with hospital study as requested at discharge.

## 2017-07-18 ENCOUNTER — Telehealth: Payer: Self-pay | Admitting: Internal Medicine

## 2017-07-18 ENCOUNTER — Other Ambulatory Visit: Payer: Medicare Other

## 2017-07-18 NOTE — Telephone Encounter (Signed)
Notes recorded by Eula Listen, RN on 07/17/2017 at 5:01 PM EST LMTCB 1/8 ------  Notes recorded by Deneise Lever, MD on 07/17/2017 at 4:35 PM EST CT chest- stable since previous CT. Some improvement of pneumonia, but there are small areas of pneumonia involvement and there is fluid in the esophagus. These suggest she has been aspirating from her stomach into her lungs, as we discussed at our office visit. Important that she sit upright to eat and drink, and don't lie down for an hour after eating. Keep on with recommendations from the office meeting, but call sooner if needed.  Notes recorded by Eula Listen, RN on 07/17/2017 at 3:50 PM EST LMTCB 1/8 ------  Notes recorded by Deneise Lever, MD on 07/17/2017 at 3:05 PM EST Labs- blood cell counts are ok. The chemistry levels of sodium and chloride is low. I suggest she salt her food as she likes, for taste. We will recheck labs in the future.  Pt gave verbal to speak with her daughter [name]. [name] is aware of results and voiced her understanding. Nothing further is needed.

## 2017-07-19 DIAGNOSIS — I10 Essential (primary) hypertension: Secondary | ICD-10-CM | POA: Diagnosis not present

## 2017-07-19 DIAGNOSIS — K219 Gastro-esophageal reflux disease without esophagitis: Secondary | ICD-10-CM | POA: Diagnosis not present

## 2017-07-19 DIAGNOSIS — J3800 Paralysis of vocal cords and larynx, unspecified: Secondary | ICD-10-CM | POA: Diagnosis not present

## 2017-07-19 DIAGNOSIS — J441 Chronic obstructive pulmonary disease with (acute) exacerbation: Secondary | ICD-10-CM | POA: Diagnosis not present

## 2017-07-19 DIAGNOSIS — Z8701 Personal history of pneumonia (recurrent): Secondary | ICD-10-CM | POA: Diagnosis not present

## 2017-07-19 DIAGNOSIS — I447 Left bundle-branch block, unspecified: Secondary | ICD-10-CM | POA: Diagnosis not present

## 2017-07-19 DIAGNOSIS — J9621 Acute and chronic respiratory failure with hypoxia: Secondary | ICD-10-CM | POA: Diagnosis not present

## 2017-07-20 ENCOUNTER — Other Ambulatory Visit: Payer: Medicare Other

## 2017-07-24 DIAGNOSIS — J3800 Paralysis of vocal cords and larynx, unspecified: Secondary | ICD-10-CM | POA: Diagnosis not present

## 2017-07-24 DIAGNOSIS — I447 Left bundle-branch block, unspecified: Secondary | ICD-10-CM | POA: Diagnosis not present

## 2017-07-24 DIAGNOSIS — I10 Essential (primary) hypertension: Secondary | ICD-10-CM | POA: Diagnosis not present

## 2017-07-24 DIAGNOSIS — J9621 Acute and chronic respiratory failure with hypoxia: Secondary | ICD-10-CM | POA: Diagnosis not present

## 2017-07-24 DIAGNOSIS — J441 Chronic obstructive pulmonary disease with (acute) exacerbation: Secondary | ICD-10-CM | POA: Diagnosis not present

## 2017-07-24 DIAGNOSIS — Z8701 Personal history of pneumonia (recurrent): Secondary | ICD-10-CM | POA: Diagnosis not present

## 2017-07-25 ENCOUNTER — Telehealth: Payer: Self-pay | Admitting: Internal Medicine

## 2017-07-25 MED ORDER — HYDROCOD POLST-CPM POLST ER 10-8 MG/5ML PO SUER: 5.0000 mL | Freq: Two times a day (BID) | ORAL | 0 refills | Status: DC | PRN

## 2017-07-25 NOTE — Telephone Encounter (Signed)
Rx printed and placed on CDY's desk to be signed  Will hold signed to document when this was placed up front

## 2017-07-25 NOTE — Telephone Encounter (Signed)
Ok to refill- increase amount to 200 ml

## 2017-07-25 NOTE — Telephone Encounter (Signed)
Rx has signed and placed up front for pick up. Pt is aware. Nothing further was needed.

## 2017-07-25 NOTE — Telephone Encounter (Signed)
Spoke with the pt  She is asking for tussionex RF  She was last given #120 ml on 03/29/17   She was seen last on 07/17/17 and reports her cough is about the same since then  She is taking mucinex daily  Next appt 08/16/17  She wants her daughter to pick up tussionex script today  Please advise thanks! Allergies  Allergen Reactions  . Ambien [Zolpidem]     Foggy mind-gets up and walks around (pt does not remember anything of the event)  . Asa [Aspirin] Other (See Comments)    Spits up blood  . Boniva [Ibandronic Acid] Other (See Comments)    GI upset  . Cystex [Germanium] Nausea Only  . Doxycycline Nausea Only and Hypertension   Current Outpatient Medications on File Prior to Visit  Medication Sig Dispense Refill  . ALPRAZolam (XANAX) 0.5 MG tablet Take 0.25-0.5 mg by mouth 2 (two) times daily as needed for anxiety.    Marland Kitchen amitriptyline (ELAVIL) 50 MG tablet Take 50 mg by mouth at bedtime.     Marland Kitchen amLODipine (NORVASC) 5 MG tablet Take 5 mg by mouth every evening.     . Azelastine-Fluticasone (DYMISTA) 137-50 MCG/ACT SUSP Place 1-2 sprays into the nose daily. (Patient taking differently: Place 1-2 sprays into the nose daily as needed (for allergies). ) 69 g 3  . beclomethasone (QVAR REDIHALER) 40 MCG/ACT inhaler Inhale 2 puffs 2 (two) times daily into the lungs. 3 Inhaler 3  . Calcium Carbonate-Vit D-Min (CALCIUM 1200 PO) Take 1 capsule by mouth every other day.     . celecoxib (CELEBREX) 200 MG capsule Take 1 capsule (200 mg total) by mouth 2 (two) times daily. (Patient taking differently: Take 200 mg by mouth 2 (two) times daily as needed for mild pain. ) 60 capsule 0  . cholecalciferol (VITAMIN D) 1000 UNITS tablet Take 1,000 Units by mouth daily.    . Diphenhyd-Hydrocort-Nystatin (FIRST-DUKES MOUTHWASH) SUSP Use 1 teaspoon to swish and swallow 4 times a day 120 mL 1  . Glucosamine-Chondroit-Vit C-Mn (GLUCOSAMINE CHONDR 1500 COMPLX) CAPS Take 1 capsule by mouth daily.     Marland Kitchen ipratropium  (ATROVENT HFA) 17 MCG/ACT inhaler Inhale 2 puffs into the lungs every 6 (six) hours as needed for wheezing. 1 Inhaler 11  . irbesartan (AVAPRO) 300 MG tablet Take 300 mg by mouth daily.    Marland Kitchen levothyroxine (SYNTHROID, LEVOTHROID) 88 MCG tablet Take 88 mcg by mouth daily before breakfast.     . Multiple Vitamin (MULTIVITAMIN) tablet Take 1 tablet by mouth every Monday, Wednesday, and Friday.     Marland Kitchen omeprazole (PRILOSEC) 20 MG capsule Take 1 capsule (20 mg total) by mouth daily. 90 capsule 4  . Spacer/Aero-Holding Chambers (AEROCHAMBER MV) inhaler Use as instructed 1 each 0  . SPIRIVA HANDIHALER 18 MCG inhalation capsule INHALE THE CONTENTS OF 1 CAPSULE DAILY 90 capsule 0   No current facility-administered medications on file prior to visit.

## 2017-07-26 DIAGNOSIS — I10 Essential (primary) hypertension: Secondary | ICD-10-CM | POA: Diagnosis not present

## 2017-07-26 DIAGNOSIS — I447 Left bundle-branch block, unspecified: Secondary | ICD-10-CM | POA: Diagnosis not present

## 2017-07-26 DIAGNOSIS — J3800 Paralysis of vocal cords and larynx, unspecified: Secondary | ICD-10-CM | POA: Diagnosis not present

## 2017-07-26 DIAGNOSIS — J441 Chronic obstructive pulmonary disease with (acute) exacerbation: Secondary | ICD-10-CM | POA: Diagnosis not present

## 2017-07-26 DIAGNOSIS — J9621 Acute and chronic respiratory failure with hypoxia: Secondary | ICD-10-CM | POA: Diagnosis not present

## 2017-07-26 DIAGNOSIS — Z8701 Personal history of pneumonia (recurrent): Secondary | ICD-10-CM | POA: Diagnosis not present

## 2017-08-01 DIAGNOSIS — I447 Left bundle-branch block, unspecified: Secondary | ICD-10-CM | POA: Diagnosis not present

## 2017-08-01 DIAGNOSIS — J3800 Paralysis of vocal cords and larynx, unspecified: Secondary | ICD-10-CM | POA: Diagnosis not present

## 2017-08-01 DIAGNOSIS — I10 Essential (primary) hypertension: Secondary | ICD-10-CM | POA: Diagnosis not present

## 2017-08-01 DIAGNOSIS — Z8701 Personal history of pneumonia (recurrent): Secondary | ICD-10-CM | POA: Diagnosis not present

## 2017-08-01 DIAGNOSIS — J9621 Acute and chronic respiratory failure with hypoxia: Secondary | ICD-10-CM | POA: Diagnosis not present

## 2017-08-01 DIAGNOSIS — J441 Chronic obstructive pulmonary disease with (acute) exacerbation: Secondary | ICD-10-CM | POA: Diagnosis not present

## 2017-08-08 DIAGNOSIS — J9621 Acute and chronic respiratory failure with hypoxia: Secondary | ICD-10-CM | POA: Diagnosis not present

## 2017-08-08 DIAGNOSIS — I10 Essential (primary) hypertension: Secondary | ICD-10-CM | POA: Diagnosis not present

## 2017-08-08 DIAGNOSIS — J441 Chronic obstructive pulmonary disease with (acute) exacerbation: Secondary | ICD-10-CM | POA: Diagnosis not present

## 2017-08-08 DIAGNOSIS — I447 Left bundle-branch block, unspecified: Secondary | ICD-10-CM | POA: Diagnosis not present

## 2017-08-08 DIAGNOSIS — Z8701 Personal history of pneumonia (recurrent): Secondary | ICD-10-CM | POA: Diagnosis not present

## 2017-08-08 DIAGNOSIS — J3800 Paralysis of vocal cords and larynx, unspecified: Secondary | ICD-10-CM | POA: Diagnosis not present

## 2017-08-09 DIAGNOSIS — J9621 Acute and chronic respiratory failure with hypoxia: Secondary | ICD-10-CM | POA: Diagnosis not present

## 2017-08-09 DIAGNOSIS — I447 Left bundle-branch block, unspecified: Secondary | ICD-10-CM | POA: Diagnosis not present

## 2017-08-09 DIAGNOSIS — I10 Essential (primary) hypertension: Secondary | ICD-10-CM | POA: Diagnosis not present

## 2017-08-09 DIAGNOSIS — J3800 Paralysis of vocal cords and larynx, unspecified: Secondary | ICD-10-CM | POA: Diagnosis not present

## 2017-08-09 DIAGNOSIS — Z8701 Personal history of pneumonia (recurrent): Secondary | ICD-10-CM | POA: Diagnosis not present

## 2017-08-09 DIAGNOSIS — J441 Chronic obstructive pulmonary disease with (acute) exacerbation: Secondary | ICD-10-CM | POA: Diagnosis not present

## 2017-08-14 DIAGNOSIS — I447 Left bundle-branch block, unspecified: Secondary | ICD-10-CM | POA: Diagnosis not present

## 2017-08-14 DIAGNOSIS — J3800 Paralysis of vocal cords and larynx, unspecified: Secondary | ICD-10-CM | POA: Diagnosis not present

## 2017-08-14 DIAGNOSIS — Z8701 Personal history of pneumonia (recurrent): Secondary | ICD-10-CM | POA: Diagnosis not present

## 2017-08-14 DIAGNOSIS — I10 Essential (primary) hypertension: Secondary | ICD-10-CM | POA: Diagnosis not present

## 2017-08-14 DIAGNOSIS — J441 Chronic obstructive pulmonary disease with (acute) exacerbation: Secondary | ICD-10-CM | POA: Diagnosis not present

## 2017-08-14 DIAGNOSIS — J9621 Acute and chronic respiratory failure with hypoxia: Secondary | ICD-10-CM | POA: Diagnosis not present

## 2017-08-16 ENCOUNTER — Telehealth: Payer: Self-pay | Admitting: Internal Medicine

## 2017-08-16 ENCOUNTER — Ambulatory Visit (INDEPENDENT_AMBULATORY_CARE_PROVIDER_SITE_OTHER): Payer: Medicare Other | Admitting: Internal Medicine

## 2017-08-16 ENCOUNTER — Encounter: Payer: Self-pay | Admitting: Internal Medicine

## 2017-08-16 DIAGNOSIS — K219 Gastro-esophageal reflux disease without esophagitis: Secondary | ICD-10-CM | POA: Diagnosis not present

## 2017-08-16 DIAGNOSIS — J9621 Acute and chronic respiratory failure with hypoxia: Secondary | ICD-10-CM

## 2017-08-16 DIAGNOSIS — J479 Bronchiectasis, uncomplicated: Secondary | ICD-10-CM | POA: Diagnosis not present

## 2017-08-16 MED ORDER — FLUCONAZOLE 100 MG PO TABS: 100.0000 mg | ORAL_TABLET | Freq: Every day | ORAL | 0 refills | Status: AC

## 2017-08-16 MED ORDER — FIRST-DUKES MOUTHWASH MT SUSP: OROMUCOSAL | 1 refills | Status: DC

## 2017-08-16 NOTE — Patient Instructions (Addendum)
Ok to continue the same meds. Ok to call for refills, or have the drug store call us.  We suggested elevating the head end of your bed by putting a brick under each of the head legs. This will help reduce the risk of aspirating stomach juice at night.   Script sent for [employer]  Ok to continue oxygen 2l continuously.

## 2017-08-16 NOTE — Telephone Encounter (Signed)
If mouthwash for irritation can do any combo of malox, benadryl, lidocaine or hydrocortisone.

## 2017-08-16 NOTE — Telephone Encounter (Signed)
Please suggest to her that we trry Rx Diflucan 100 mg, # 7, 1 daily. This would be instead of the Magic Mouthwash, which the drug store tells [place] is on back-order.

## 2017-08-16 NOTE — Telephone Encounter (Signed)
Spoke to [place] with [facility]. Marita [place]  states that Nystatin is on back order, which is one of the components of [facility]'s magic mouthwash.    Marita [place] states if CY is trying to treat thrush, other options would be clotrinazole troche or Nystatin tablets.   CY please advise. Thanks  Current Outpatient Medications on File Prior to Visit  Medication Sig Dispense Refill  . ALPRAZolam (XANAX) 0.5 MG tablet Take 0.25-0.5 mg by mouth 2 (two) times daily as needed for anxiety.    Marland Kitchen amitriptyline (ELAVIL) 50 MG tablet Take 50 mg by mouth at bedtime.     Marland Kitchen amLODipine (NORVASC) 5 MG tablet Take 5 mg by mouth every evening.     . Azelastine-Fluticasone (DYMISTA) 137-50 MCG/ACT SUSP Place 1-2 sprays into the nose daily. (Patient taking differently: Place 1-2 sprays into the nose daily as needed (for allergies). ) 69 g 3  . beclomethasone (QVAR REDIHALER) 40 MCG/ACT inhaler Inhale 2 puffs 2 (two) times daily into the lungs. 3 Inhaler 3  . Calcium Carbonate-Vit D-Min (CALCIUM 1200 PO) Take 1 capsule by mouth every other day.     . celecoxib (CELEBREX) 200 MG capsule Take 1 capsule (200 mg total) by mouth 2 (two) times daily. (Patient taking differently: Take 200 mg by mouth 2 (two) times daily as needed for mild pain. ) 60 capsule 0  . chlorpheniramine-HYDROcodone (TUSSIONEX PENNKINETIC ER) 10-8 MG/5ML SUER Take 5 mLs by mouth every 12 (twelve) hours as needed for cough. 200 mL 0  . cholecalciferol (VITAMIN D) 1000 UNITS tablet Take 1,000 Units by mouth daily.    . Diphenhyd-Hydrocort-Nystatin (FIRST-DUKES MOUTHWASH) SUSP Use 1 teaspoon to swish and swallow 4 times a day 120 mL 1  . Glucosamine-Chondroit-Vit C-Mn (GLUCOSAMINE CHONDR 1500 COMPLX) CAPS Take 1 capsule by mouth daily.     Marland Kitchen ipratropium (ATROVENT HFA) 17 MCG/ACT inhaler Inhale 2 puffs into the lungs every 6 (six) hours as needed for wheezing. 1 Inhaler 11  . irbesartan (AVAPRO) 300 MG tablet Take 300 mg by mouth daily.    Marland Kitchen levothyroxine (SYNTHROID,  LEVOTHROID) 88 MCG tablet Take 88 mcg by mouth daily before breakfast.     . Multiple Vitamin (MULTIVITAMIN) tablet Take 1 tablet by mouth every Monday, Wednesday, and Friday.     Marland Kitchen omeprazole (PRILOSEC) 20 MG capsule Take 1 capsule (20 mg total) by mouth daily. 90 capsule 4  . Spacer/Aero-Holding Chambers (AEROCHAMBER MV) inhaler Use as instructed 1 each 0  . SPIRIVA HANDIHALER 18 MCG inhalation capsule INHALE THE CONTENTS OF 1 CAPSULE DAILY 90 capsule 0   No current facility-administered medications on file prior to visit.

## 2017-08-16 NOTE — Progress Notes (Signed)
Patient ID: [name], female    DOB: 1931-02-10, 84 y.o.   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 Discharged on augmentin, pred taper, O2.    O2 2L/ Advanced ----Post hospital visit; Was in hospital for PNA. Pt feels better but still continues to have weakness.  Arrival Sat today 99% on 2L O2 Daughters are with her.  She has been very weak.  Cough is productive without fever.  Takes fractions of a Xanax tablet to help her sleep.Marland Kitchen UTD flu/ pvax CTa chest 06/24/17 IMPRESSION: 1. No evidence for acute pulmonary embolus. 2. Bilateral areas of bronchiectasis. 3. Bilateral multifocal areas of ground-glass attenuation and interlobular septal thickening is noted. The appearance is nonspecific but in the acute setting may represent multifocal bronchopneumonia. Other inflammatory and infectious etiologies are not excluded. Suggest followup imaging to ensure resolution. In the absence of interval clearing a high-resolution CT of the chest would be advised. 4.  Aortic Atherosclerosis (ICD10-I70.0).  08/16/17- 84 year old female never smoker followed for Bronchiectasis with history MAIC-treated, intermittent hemoptysis, complicated by chronic vocal cord paresis, Hashimoto's thyroiditis, GERD, insomnia Spiriva, Atrovent HFA, Tussionex,  O2 2 L/Advanced Oxygen definitely helps.  She still tires easily.  Denies  fever, sweats.  Daily productive cough with no blood from chest.  On oxygen, she has noticed a few blood streaks in nasal discharge. BMET-07/17/17-hyponatremia 131 Sputum culture 06/23/17-mixed organisms, WBCs, Candida CT chest Hi Res  07/17/17 1. Severe cylindrical and varicoid bronchiectasis throughout both lungs involving all lung lobes, most prominent in the lingula and right middle lobe, significantly worsened since 2008, stable since 06/24/2017. Scattered irregular nodular parenchymal bands compatible with postinfectious scarring. Findings are most likely the sequela of chronic atypical mycobacterial infection (MAI). 2. Interval partial resolution of multilobar pneumonia seen on 06/24/2017 chest CT. 3. New smaller foci of pneumonia in the dependent lower lobes, raising consideration of aspiration pneumonia. 4. Mildly patulous thoracic esophagus with small fluid level, suggesting chronic esophageal dysmotility and/or gastroesophageal reflux disease. Aortic Atherosclerosis (ICD10-I70.0).  ROS-see HPI + = positive Constitutional:   No-   weight loss, +night sweats, fevers, chills, fatigue, lassitude. HEENT:   No-  headaches, difficulty swallowing, tooth/dental problems, sore throat, + chronic hoarseness      No-  sneezing, itching, ear ache, nasal congestion, post nasal drip,  CV:  No-   chest pain, orthopnea, PND, swelling in lower extremities, anasarca,  dizziness, palpitations Resp: +  shortness of breath with exertion or at rest.              + productive cough,  No non-productive cough,  + coughing up of blood.            +  change in color of mucus.  No- wheezing.   Skin: No-   rash or lesions. GI:  + heartburn, indigestion, No-abdominal pain, nausea, No-vomiting,  GU: MS:  No-   joint pain or swelling. + Back pain   Neuro-     nothing unusual Psych:  No- change in mood or affect. No depression or anxiety.  No memory loss.  Objective:   Physical Exam General-  Alert, Oriented,  Affect-appropriate, Distress- none acute + elderly/frail, +O2 2 L Skin- rash-none, lesions- none, excoriation- none Lymphadenopathy- none Head- atraumatic + wig            Eyes- Gross vision intact, PERRLA, conjunctivae clear secretions            Ears- Hearing, canals normal            Nose- Clear, No-Septal dev, mucus, polyps, erosion, perforation             Throat- Mallampati II , mucosa clear/ dry , drainage- none, tonsils- atrophy, +chronic hoarseness Neck- flexible , trachea midline, no stridor , thyroid nl, carotid no bruit Chest - symmetrical excursion , unlabored           Heart/CV- RRR now , no murmur , no gallop  , no rub, nl s1 s2                           - JVD- none , edema + trace, stasis changes- none, varices- none           Lung- + rhonchi scattered, unlabored, wheeze- none, cough+light , dullness-none,  rub- none           Chest wall-  Abd-  Br/ Gen/ Rectal- Not done, not indicated Extrem- cyanosis- none, clubbing, none, atrophy- none, strength- nl Neuro- grossly intact to observation

## 2017-08-16 NOTE — Telephone Encounter (Signed)
[name] did [facility] that [facility] is available.  Pt is aware of change in medication and voiced her understanding. Rx for Diflucan has been sent to preferred pharmacy. Nothing further is needed.

## 2017-08-17 DIAGNOSIS — Z8701 Personal history of pneumonia (recurrent): Secondary | ICD-10-CM | POA: Diagnosis not present

## 2017-08-17 DIAGNOSIS — J441 Chronic obstructive pulmonary disease with (acute) exacerbation: Secondary | ICD-10-CM | POA: Diagnosis not present

## 2017-08-17 DIAGNOSIS — I447 Left bundle-branch block, unspecified: Secondary | ICD-10-CM | POA: Diagnosis not present

## 2017-08-17 DIAGNOSIS — J9621 Acute and chronic respiratory failure with hypoxia: Secondary | ICD-10-CM | POA: Diagnosis not present

## 2017-08-17 DIAGNOSIS — J3800 Paralysis of vocal cords and larynx, unspecified: Secondary | ICD-10-CM | POA: Diagnosis not present

## 2017-08-17 DIAGNOSIS — I10 Essential (primary) hypertension: Secondary | ICD-10-CM | POA: Diagnosis not present

## 2017-08-19 NOTE — Assessment & Plan Note (Signed)
Without acute exacerbation but chronically symptomatic.

## 2017-08-19 NOTE — Assessment & Plan Note (Signed)
She depends on oxygen now without change.  We discussed nasal saline gel to protect nasal mucosa from overdrying caused by oxygen.  She does have a humidifier on her home concentrator.

## 2017-08-19 NOTE — Assessment & Plan Note (Signed)
High probability that her bronchiectasis resulted from chronic low-grade aspiration. Plan-reflux precautions addressed and elevation of head of bed recommended.

## 2017-08-22 DIAGNOSIS — J3800 Paralysis of vocal cords and larynx, unspecified: Secondary | ICD-10-CM | POA: Diagnosis not present

## 2017-08-22 DIAGNOSIS — Z8701 Personal history of pneumonia (recurrent): Secondary | ICD-10-CM | POA: Diagnosis not present

## 2017-08-22 DIAGNOSIS — J9621 Acute and chronic respiratory failure with hypoxia: Secondary | ICD-10-CM | POA: Diagnosis not present

## 2017-08-22 DIAGNOSIS — I10 Essential (primary) hypertension: Secondary | ICD-10-CM | POA: Diagnosis not present

## 2017-08-22 DIAGNOSIS — I447 Left bundle-branch block, unspecified: Secondary | ICD-10-CM | POA: Diagnosis not present

## 2017-08-22 DIAGNOSIS — J441 Chronic obstructive pulmonary disease with (acute) exacerbation: Secondary | ICD-10-CM | POA: Diagnosis not present

## 2017-08-24 ENCOUNTER — Other Ambulatory Visit (INDEPENDENT_AMBULATORY_CARE_PROVIDER_SITE_OTHER): Payer: Self-pay | Admitting: Orthopaedic Surgery

## 2017-08-27 NOTE — Telephone Encounter (Signed)
Ok for refill? 

## 2017-08-29 ENCOUNTER — Telehealth (INDEPENDENT_AMBULATORY_CARE_PROVIDER_SITE_OTHER): Payer: Self-pay | Admitting: Orthopaedic Surgery

## 2017-08-29 DIAGNOSIS — Z8701 Personal history of pneumonia (recurrent): Secondary | ICD-10-CM | POA: Diagnosis not present

## 2017-08-29 DIAGNOSIS — J441 Chronic obstructive pulmonary disease with (acute) exacerbation: Secondary | ICD-10-CM | POA: Diagnosis not present

## 2017-08-29 DIAGNOSIS — I447 Left bundle-branch block, unspecified: Secondary | ICD-10-CM | POA: Diagnosis not present

## 2017-08-29 DIAGNOSIS — J9621 Acute and chronic respiratory failure with hypoxia: Secondary | ICD-10-CM | POA: Diagnosis not present

## 2017-08-29 DIAGNOSIS — I10 Essential (primary) hypertension: Secondary | ICD-10-CM | POA: Diagnosis not present

## 2017-08-29 DIAGNOSIS — J3800 Paralysis of vocal cords and larynx, unspecified: Secondary | ICD-10-CM | POA: Diagnosis not present

## 2017-08-29 NOTE — Telephone Encounter (Signed)
[name]  [facility]  Ref #[id number]  Pt insurance does not expect the brand of med requested. Vm was left around 2pm

## 2017-09-03 DIAGNOSIS — J441 Chronic obstructive pulmonary disease with (acute) exacerbation: Secondary | ICD-10-CM | POA: Diagnosis not present

## 2017-09-03 DIAGNOSIS — J9621 Acute and chronic respiratory failure with hypoxia: Secondary | ICD-10-CM | POA: Diagnosis not present

## 2017-09-03 DIAGNOSIS — I10 Essential (primary) hypertension: Secondary | ICD-10-CM | POA: Diagnosis not present

## 2017-09-03 DIAGNOSIS — Z8701 Personal history of pneumonia (recurrent): Secondary | ICD-10-CM | POA: Diagnosis not present

## 2017-09-03 DIAGNOSIS — I447 Left bundle-branch block, unspecified: Secondary | ICD-10-CM | POA: Diagnosis not present

## 2017-09-03 DIAGNOSIS — J3800 Paralysis of vocal cords and larynx, unspecified: Secondary | ICD-10-CM | POA: Diagnosis not present

## 2017-09-05 NOTE — Telephone Encounter (Signed)
I called [facility] at [phone] and obtained authorization. Patient has approved coverage from 08/06/17-09/05/18 on Celebrex. Case number is [id number].   I spoke with Laqueshia at [phone]. She states they are processing approval and patient should receive medication within next 3-5 days.  I left voicemail for patient advising.

## 2017-11-02 DIAGNOSIS — D0439 Carcinoma in situ of skin of other parts of face: Secondary | ICD-10-CM | POA: Diagnosis not present

## 2017-11-02 DIAGNOSIS — L821 Other seborrheic keratosis: Secondary | ICD-10-CM | POA: Diagnosis not present

## 2017-11-02 DIAGNOSIS — B078 Other viral warts: Secondary | ICD-10-CM | POA: Diagnosis not present

## 2017-11-02 DIAGNOSIS — D225 Melanocytic nevi of trunk: Secondary | ICD-10-CM | POA: Diagnosis not present

## 2017-11-12 DIAGNOSIS — J441 Chronic obstructive pulmonary disease with (acute) exacerbation: Secondary | ICD-10-CM | POA: Diagnosis not present

## 2017-11-23 ENCOUNTER — Telehealth: Payer: Self-pay | Admitting: Internal Medicine

## 2017-11-23 MED ORDER — MAGIC MOUTHWASH: 10.0000 mL | Freq: Three times a day (TID) | ORAL | 0 refills | Status: DC

## 2017-11-23 NOTE — Telephone Encounter (Signed)
Ok Magic Mouthwash   140 ml,    Swish and swallow 10 ml, 3 times daily

## 2017-11-23 NOTE — Telephone Encounter (Signed)
Called pt and advised message from the provider. Pt understood and verbalized understanding. Nothing further is needed.   Rx sent to the pharmacy.  

## 2017-11-23 NOTE — Telephone Encounter (Signed)
Called and spoke with patient, she states that she has several sore places in her mouth. This has been going on for a few days now and she is having trouble eating. She is asking that we send in a prescription for magic mouth wash.    CY please advise on this, thank you.

## 2017-11-23 NOTE — Telephone Encounter (Signed)
Current Outpatient Medications on File Prior to Visit  Medication Sig Dispense Refill  . ALPRAZolam (XANAX) 0.5 MG tablet Take 0.25-0.5 mg by mouth 2 (two) times daily as needed for anxiety.    Marland Kitchen amitriptyline (ELAVIL) 50 MG tablet Take 50 mg by mouth at bedtime.     Marland Kitchen amLODipine (NORVASC) 5 MG tablet Take 5 mg by mouth every evening.     . Azelastine-Fluticasone (DYMISTA) 137-50 MCG/ACT SUSP Place 1-2 sprays into the nose daily. (Patient taking differently: Place 1-2 sprays into the nose daily as needed (for allergies). ) 69 g 3  . beclomethasone (QVAR REDIHALER) 40 MCG/ACT inhaler Inhale 2 puffs 2 (two) times daily into the lungs. 3 Inhaler 3  . Calcium Carbonate-Vit D-Min (CALCIUM 1200 PO) Take 1 capsule by mouth every other day.     . celecoxib (CELEBREX) 200 MG capsule TAKE 1 CAPSULE TWICE A DAY 180 capsule 4  . chlorpheniramine-HYDROcodone (TUSSIONEX PENNKINETIC ER) 10-8 MG/5ML SUER Take 5 mLs by mouth every 12 (twelve) hours as needed for cough. 200 mL 0  . cholecalciferol (VITAMIN D) 1000 UNITS tablet Take 1,000 Units by mouth daily.    . Glucosamine-Chondroit-Vit C-Mn (GLUCOSAMINE CHONDR 1500 COMPLX) CAPS Take 1 capsule by mouth daily.     Marland Kitchen ipratropium (ATROVENT HFA) 17 MCG/ACT inhaler Inhale 2 puffs into the lungs every 6 (six) hours as needed for wheezing. 1 Inhaler 11  . irbesartan (AVAPRO) 300 MG tablet Take 300 mg by mouth daily.    Marland Kitchen levothyroxine (SYNTHROID, LEVOTHROID) 88 MCG tablet Take 88 mcg by mouth daily before breakfast.     . Multiple Vitamin (MULTIVITAMIN) tablet Take 1 tablet by mouth every Monday, Wednesday, and Friday.     Marland Kitchen omeprazole (PRILOSEC) 20 MG capsule Take 1 capsule (20 mg total) by mouth daily. 90 capsule 4  . Spacer/Aero-Holding Chambers (AEROCHAMBER MV) inhaler Use as instructed 1 each 0  . SPIRIVA HANDIHALER 18 MCG inhalation capsule INHALE THE CONTENTS OF 1 CAPSULE DAILY 90 capsule 0   No current facility-administered medications on file prior to visit.     Allergies  Allergen Reactions  . Ambien [Zolpidem]     Foggy mind-gets up and walks around (pt does not remember anything of the event)  . Asa [Aspirin] Other (See Comments)    Spits up blood  . Boniva [Ibandronic Acid] Other (See Comments)    GI upset  . Cystex [Germanium] Nausea Only  . Doxycycline Nausea Only and Hypertension

## 2017-11-28 DIAGNOSIS — D225 Melanocytic nevi of trunk: Secondary | ICD-10-CM | POA: Diagnosis not present

## 2017-11-28 DIAGNOSIS — L905 Scar conditions and fibrosis of skin: Secondary | ICD-10-CM | POA: Diagnosis not present

## 2017-11-28 DIAGNOSIS — L821 Other seborrheic keratosis: Secondary | ICD-10-CM | POA: Diagnosis not present

## 2017-11-28 DIAGNOSIS — Z08 Encounter for follow-up examination after completed treatment for malignant neoplasm: Secondary | ICD-10-CM | POA: Diagnosis not present

## 2017-11-28 DIAGNOSIS — D1801 Hemangioma of skin and subcutaneous tissue: Secondary | ICD-10-CM | POA: Diagnosis not present

## 2017-11-28 DIAGNOSIS — Z85820 Personal history of malignant melanoma of skin: Secondary | ICD-10-CM | POA: Diagnosis not present

## 2017-12-20 ENCOUNTER — Telehealth: Payer: Self-pay | Admitting: Internal Medicine

## 2017-12-20 MED ORDER — ACLIDINIUM BROMIDE 400 MCG/ACT IN AEPB: 1.0000 | INHALATION_SPRAY | Freq: Two times a day (BID) | RESPIRATORY_TRACT | 3 refills | Status: DC

## 2017-12-20 NOTE — Telephone Encounter (Signed)
Called and spoke with patient, she states that she got a letter from her insurance company stating that they would no longer be covering her Spiriva. They gave the patient two options that they would cover. The options are   1. Incruse 2. Tudorza  Patient would like to take one of these instead of paying out of pocket.   CY please advise on this, thank you.    Current Outpatient Medications on File Prior to Visit  Medication Sig Dispense Refill  . ALPRAZolam (XANAX) 0.5 MG tablet Take 0.25-0.5 mg by mouth 2 (two) times daily as needed for anxiety.    Marland Kitchen amitriptyline (ELAVIL) 50 MG tablet Take 50 mg by mouth at bedtime.     Marland Kitchen amLODipine (NORVASC) 5 MG tablet Take 5 mg by mouth every evening.     . Azelastine-Fluticasone (DYMISTA) 137-50 MCG/ACT SUSP Place 1-2 sprays into the nose daily. (Patient taking differently: Place 1-2 sprays into the nose daily as needed (for allergies). ) 69 g 3  . beclomethasone (QVAR REDIHALER) 40 MCG/ACT inhaler Inhale 2 puffs 2 (two) times daily into the lungs. 3 Inhaler 3  . Calcium Carbonate-Vit D-Min (CALCIUM 1200 PO) Take 1 capsule by mouth every other day.     . celecoxib (CELEBREX) 200 MG capsule TAKE 1 CAPSULE TWICE A DAY 180 capsule 4  . chlorpheniramine-HYDROcodone (TUSSIONEX PENNKINETIC ER) 10-8 MG/5ML SUER Take 5 mLs by mouth every 12 (twelve) hours as needed for cough. 200 mL 0  . cholecalciferol (VITAMIN D) 1000 UNITS tablet Take 1,000 Units by mouth daily.    . Glucosamine-Chondroit-Vit C-Mn (GLUCOSAMINE CHONDR 1500 COMPLX) CAPS Take 1 capsule by mouth daily.     Marland Kitchen ipratropium (ATROVENT HFA) 17 MCG/ACT inhaler Inhale 2 puffs into the lungs every 6 (six) hours as needed for wheezing. 1 Inhaler 11  . irbesartan (AVAPRO) 300 MG tablet Take 300 mg by mouth daily.    Marland Kitchen levothyroxine (SYNTHROID, LEVOTHROID) 88 MCG tablet Take 88 mcg by mouth daily before breakfast.     . magic mouthwash SOLN Take 10 mLs by mouth 3 (three) times daily. Swish and swallow 140  mL 0  . Multiple Vitamin (MULTIVITAMIN) tablet Take 1 tablet by mouth every Monday, Wednesday, and Friday.     Marland Kitchen omeprazole (PRILOSEC) 20 MG capsule Take 1 capsule (20 mg total) by mouth daily. 90 capsule 4  . Spacer/Aero-Holding Chambers (AEROCHAMBER MV) inhaler Use as instructed 1 each 0  . SPIRIVA HANDIHALER 18 MCG inhalation capsule INHALE THE CONTENTS OF 1 CAPSULE DAILY 90 capsule 0   No current facility-administered medications on file prior to visit.    Allergies  Allergen Reactions  . Ambien [Zolpidem]     Foggy mind-gets up and walks around (pt does not remember anything of the event)  . Asa [Aspirin] Other (See Comments)    Spits up blood  . Boniva [Ibandronic Acid] Other (See Comments)    GI upset  . Cystex [Germanium] Nausea Only  . Doxycycline Nausea Only and Hypertension

## 2017-12-20 NOTE — Telephone Encounter (Signed)
Fine to change to Tudorza    Inhale 1 puff, twice daily       # 1, ref x 12, or ok 3 month/ ref x 3 for mail order

## 2017-12-20 NOTE — Telephone Encounter (Signed)
Spoke with pt. She is aware of this medication change. Rx has been sent in. Nothing further was needed. 

## 2017-12-26 DIAGNOSIS — L905 Scar conditions and fibrosis of skin: Secondary | ICD-10-CM | POA: Diagnosis not present

## 2017-12-26 DIAGNOSIS — Z85828 Personal history of other malignant neoplasm of skin: Secondary | ICD-10-CM | POA: Diagnosis not present

## 2017-12-26 DIAGNOSIS — Z08 Encounter for follow-up examination after completed treatment for malignant neoplasm: Secondary | ICD-10-CM | POA: Diagnosis not present

## 2017-12-26 DIAGNOSIS — B351 Tinea unguium: Secondary | ICD-10-CM | POA: Diagnosis not present

## 2018-01-02 ENCOUNTER — Telehealth: Payer: Self-pay | Admitting: Internal Medicine

## 2018-01-02 NOTE — Telephone Encounter (Signed)
Called spoke with patient who reported that she is unable to take the [place] because she cannot take a deep enough inhalation to trigger the inhaler to release the medication.  She has tried this for two days without success.  Pt would like to discuss changing back to her Spiriva Handihaler, however she received notification from insurance stating that if she were to take the Spiriva again, it would have to be out of pocket.  Patient has no more Spiriva at home Pt wonders about taking Atrovent HFA as her maintenance inhaler or the Incruse that was mentioned in the 6.13.19 phone note  Advised patient that CY is not in the office this week and will not return until 7.1.19 Patient would like to wait until CY returns for recommendations and does not want to receive recommendations from another provider Advised patient TWICE that if she begins having symptoms to call this office so that we can provide recommendations to hold her until CY returns to the office.  Pt voiced her understanding

## 2018-01-07 MED ORDER — UMECLIDINIUM BROMIDE 62.5 MCG/INH IN AEPB: 1.0000 | INHALATION_SPRAY | Freq: Every day | RESPIRATORY_TRACT | 5 refills | Status: DC

## 2018-01-07 NOTE — Telephone Encounter (Signed)
I checked the samples closet, we do not have any samples of Incruse at this time. Is there something you would like for the patient to try?   Please advise thanks!

## 2018-01-07 NOTE — Telephone Encounter (Signed)
Spoke with pt. °She is aware of Dr. Young's recommendation. °Rx has been sent in. °Nothing further was needed. °

## 2018-01-07 NOTE — Telephone Encounter (Signed)
Recommend Rx Incruse    Inhale 1 puff, once daily, refill x 5

## 2018-01-07 NOTE — Telephone Encounter (Signed)
Do we have an Incruse sample she could try, to see if she can use it?   Inhale 1 puff, once daily

## 2018-02-14 ENCOUNTER — Encounter: Payer: Self-pay | Admitting: Internal Medicine

## 2018-02-14 ENCOUNTER — Ambulatory Visit (INDEPENDENT_AMBULATORY_CARE_PROVIDER_SITE_OTHER): Payer: Medicare Other | Admitting: Internal Medicine

## 2018-02-14 VITALS — BP 116/74 | HR 89 | Ht 63.0 in | Wt 84.0 lb

## 2018-02-14 DIAGNOSIS — J479 Bronchiectasis, uncomplicated: Secondary | ICD-10-CM

## 2018-02-14 DIAGNOSIS — J9621 Acute and chronic respiratory failure with hypoxia: Secondary | ICD-10-CM

## 2018-02-14 MED ORDER — AZELASTINE-FLUTICASONE 137-50 MCG/ACT NA SUSP: NASAL | 12 refills | Status: AC

## 2018-02-14 MED ORDER — BECLOMETHASONE DIPROP HFA 40 MCG/ACT IN AERB: 2.0000 | INHALATION_SPRAY | Freq: Two times a day (BID) | RESPIRATORY_TRACT | 3 refills | Status: DC

## 2018-02-14 NOTE — Patient Instructions (Signed)
Ok to continue inhaled medicines..  Order- [facility]ST pneumatic vest    For dx bronchiectasis   Please call if we can help

## 2018-02-14 NOTE — Progress Notes (Signed)
Patient ID: [name], female    DOB: [birth year], 84 y.o.   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  She still tires easily.  Denies fever, sweats.  Daily productive cough with no blood from chest.  On oxygen, she has noticed a few blood streaks in nasal discharge. BMET-07/17/17-hyponatremia 131 Sputum culture 06/23/17-mixed organisms, WBCs, Candida CT chest Hi Res  07/17/17 1. Severe cylindrical and varicoid bronchiectasis throughout both lungs involving all lung lobes, most prominent in the lingula and right middle lobe, significantly worsened since 2008, stable since 06/24/2017. Scattered irregular nodular parenchymal bands compatible with postinfectious scarring. Findings are most likely the sequela of chronic atypical mycobacterial infection (MAI). 2. Interval partial resolution of multilobar pneumonia seen on 06/24/2017 chest CT. 3. New smaller foci of pneumonia in the dependent lower lobes, raising consideration of aspiration pneumonia. 4. Mildly patulous thoracic esophagus with small fluid level, suggesting chronic esophageal dysmotility and/or gastroesophageal reflux disease. Aortic Atherosclerosis (ICD10-I70.0).  02/14/2018- 84 year old female never smoker followed for Bronchiectasis with history MAIC-treated, intermittent hemoptysis, complicated by chronic vocal cord paresis,  Hashimoto's thyroiditis, GERD/ Aspiration, insomnia Spiriva, Atrovent HFA, Tussionex,  O2 3 L/Advanced -----6 month follow up. Per patient, she is currently on 3L of O2. Having to be on O2 has been a lifestyle change for her.  InCruse Ellipta, Dymista nasal spray, She considers oxygen a big help and feels most comfortable at 3 L especially with any exertion at all. Uses amitriptyline and one half Xanax tablet at night to help sleep.  I explained this may keep her from noticing reflux and cough events at night.  She elevates head of bed but we consider chronic reflux through her paralyzed vocal cords, as the primary cause of her bronchiectasis.  She is not very strong and cough for airway clearance is not very effective.  Flutter device did not help much.  We discussed trying a pneumatic vest which I do believe would help her.  Chronic productive cough has been going on for years despite multiple antibiotics.  ROS-see HPI + = positive Constitutional:   No-   weight loss, +night sweats, fevers, chills, fatigue, lassitude. HEENT:   No-  headaches, difficulty swallowing, tooth/dental problems, sore throat, + chronic hoarseness      No-  sneezing, itching, ear ache, nasal congestion, post nasal drip,  CV:  No-   chest pain, orthopnea, PND, swelling in lower extremities, anasarca,  dizziness, palpitations Resp: +  shortness of breath with exertion or at rest.              + productive cough,  No non-productive cough,  + coughing up of blood.            +  change in color of mucus.  No- wheezing.   Skin: No-   rash or lesions. GI:  + heartburn, indigestion, No-abdominal pain, nausea, No-vomiting,  GU: MS:  No-   joint pain or swelling. + Back pain   Neuro-     nothing unusual Psych:  No- change in mood or affect. No depression or anxiety.  No memory loss.  Objective:   Physical Exam General- Alert, Oriented, Affect-appropriate, Distress- none acute + elderly/frail, +O2 3 L Skin- rash-none,  lesions- none, excoriation- none Lymphadenopathy- none Head- atraumatic + wig            Eyes- Gross vision intact, PERRLA, conjunctivae clear secretions            Ears- Hearing, canals normal            Nose- Clear, No-Septal dev, mucus, polyps, erosion, perforation             Throat- Mallampati II , mucosa clear/ dry , drainage- none, tonsils- atrophy, +chronic hoarseness Neck- flexible , trachea midline, no stridor , thyroid nl, carotid no bruit Chest - symmetrical excursion , unlabored           Heart/CV- RRR now , no murmur , no gallop  , no rub, nl s1 s2                           - JVD- none , edema + trace, stasis changes- none, varices- none           Lung- + rhonchi scattered, unlabored, wheeze- none, cough+light , dullness-none,  rub- none           Chest wall-  Abd-  Br/ Gen/ Rectal- Not done, not indicated Extrem- cyanosis- none, clubbing, none, atrophy- none, strength- nl Neuro- grossly intact to observation

## 2018-02-15 NOTE — Assessment & Plan Note (Signed)
She is dependent on continuous oxygen now and does not seem to be developing CO2 narcosis but we will continue watching for that.

## 2018-02-15 NOTE — Assessment & Plan Note (Signed)
Chronic microaspiration with severe bronchiectasis and difficulty clearing airways.  She has inadequate cough strength and found flutter device insufficient. Plan- [facility] VEST pneumatic fast for help with bronchial hearing.

## 2018-02-20 ENCOUNTER — Telehealth: Payer: Self-pay | Admitting: Internal Medicine

## 2018-02-20 NOTE — Telephone Encounter (Signed)
Spoke with pt. While waiting for our call back, she changed her mind and would like to get the smart vest. The order is still being processed. Nothing further was needed.

## 2018-02-20 NOTE — Telephone Encounter (Signed)
Ok to cancel order for pneumatic vest for now. Hope her back feels better.

## 2018-02-20 NOTE — Telephone Encounter (Signed)
Called and spoke to pt. Pt is requesting to hold off on Rx for smart vest for now, as she is experiencing back pain. Pt is concerned that vest will worsen back pain.  Pt stated she will try using Mucinex to help with mucus.   CY please advise if okay to cancel order. Thanks

## 2018-02-20 NOTE — Telephone Encounter (Signed)
Pt is returning call. Per Pt, she would like to try the smart vest and is requesting a Rx be placed for one. Cb is [phone].

## 2018-02-25 DIAGNOSIS — J449 Chronic obstructive pulmonary disease, unspecified: Secondary | ICD-10-CM | POA: Diagnosis not present

## 2018-02-25 DIAGNOSIS — E441 Mild protein-calorie malnutrition: Secondary | ICD-10-CM | POA: Diagnosis not present

## 2018-02-25 DIAGNOSIS — I10 Essential (primary) hypertension: Secondary | ICD-10-CM | POA: Diagnosis not present

## 2018-02-25 DIAGNOSIS — M5136 Other intervertebral disc degeneration, lumbar region: Secondary | ICD-10-CM | POA: Diagnosis not present

## 2018-02-25 DIAGNOSIS — Z0001 Encounter for general adult medical examination with abnormal findings: Secondary | ICD-10-CM | POA: Diagnosis not present

## 2018-02-25 DIAGNOSIS — E039 Hypothyroidism, unspecified: Secondary | ICD-10-CM | POA: Diagnosis not present

## 2018-02-25 DIAGNOSIS — F419 Anxiety disorder, unspecified: Secondary | ICD-10-CM | POA: Diagnosis not present

## 2018-02-25 DIAGNOSIS — Z79899 Other long term (current) drug therapy: Secondary | ICD-10-CM | POA: Diagnosis not present

## 2018-02-25 DIAGNOSIS — E7800 Pure hypercholesterolemia, unspecified: Secondary | ICD-10-CM | POA: Diagnosis not present

## 2018-02-25 DIAGNOSIS — Z136 Encounter for screening for cardiovascular disorders: Secondary | ICD-10-CM | POA: Diagnosis not present

## 2018-02-25 DIAGNOSIS — J479 Bronchiectasis, uncomplicated: Secondary | ICD-10-CM | POA: Diagnosis not present

## 2018-04-01 ENCOUNTER — Telehealth: Payer: Self-pay | Admitting: Internal Medicine

## 2018-04-01 NOTE — Telephone Encounter (Signed)
Spoke with patient. She was requesting a refill on her irbesartan 300mg . Advised her that per her chart, this medication has never been called in by CY before. Patient took a second look at her bottle and determined that it was actually Dr. Dorthy Cooler who prescribed the medication. Patient will call their office instead.   Nothing further needed at time of call.

## 2018-04-02 ENCOUNTER — Other Ambulatory Visit: Payer: Self-pay | Admitting: Internal Medicine

## 2018-04-02 DIAGNOSIS — J479 Bronchiectasis, uncomplicated: Secondary | ICD-10-CM

## 2018-04-17 ENCOUNTER — Telehealth: Payer: Self-pay | Admitting: Internal Medicine

## 2018-04-17 NOTE — Telephone Encounter (Signed)
Spoke with patient-states she is unable to tolerate Vest and would like to turn back in. Pt is contacting [facility] to turn back in. Will forward to CY as FYI.

## 2018-04-18 NOTE — Telephone Encounter (Signed)
Noted  

## 2018-04-19 ENCOUNTER — Telehealth: Payer: Self-pay | Admitting: Internal Medicine

## 2018-04-19 ENCOUNTER — Other Ambulatory Visit: Payer: Self-pay | Admitting: Internal Medicine

## 2018-04-19 DIAGNOSIS — J479 Bronchiectasis, uncomplicated: Secondary | ICD-10-CM

## 2018-04-19 NOTE — Telephone Encounter (Signed)
Called and spoke with Patient.  She stated that the vest made her worse and claustrophobic.  She stated that she wanted to know if it is ok to use 2L or 3L [place].  Her caregiver then got on the phone and stated that the Patient gets confused about her O2.  She said that the Patient was using 3L [place], but the caregiver placed her back on 2L .  She was wanting clarification from Dr. Annamaria Boots on O2.  The caregiver was going to call [facility] today to come pick up the vest.  Dr. Annamaria Boots, please advise   Allergies  Allergen Reactions  . Ambien [Zolpidem]     Foggy mind-gets up and walks around (pt does not remember anything of the event)  . Asa [Aspirin] Other (See Comments)    Spits up blood  . Boniva [Ibandronic Acid] Other (See Comments)    GI upset  . Cystex [Germanium] Nausea Only  . Doxycycline Nausea Only and Hypertension   Current Outpatient Medications on File Prior to Visit  Medication Sig Dispense Refill  . ALPRAZolam (XANAX) 0.5 MG tablet Take 0.25-0.5 mg by mouth 2 (two) times daily as needed for anxiety.    Marland Kitchen amitriptyline (ELAVIL) 50 MG tablet Take 50 mg by mouth at bedtime.     Marland Kitchen amLODipine (NORVASC) 5 MG tablet Take 5 mg by mouth every evening.     . Azelastine-Fluticasone (DYMISTA) 137-50 MCG/ACT SUSP 1-2 puffs each nostril daily if needed 23 g 12  . beclomethasone (QVAR REDIHALER) 40 MCG/ACT inhaler Inhale 2 puffs into the lungs 2 (two) times daily. 3 Inhaler 3  . Calcium Carbonate-Vit D-Min (CALCIUM 1200 PO) Take 1 capsule by mouth every other day.     . celecoxib (CELEBREX) 200 MG capsule TAKE 1 CAPSULE TWICE A DAY 180 capsule 4  . cholecalciferol (VITAMIN D) 1000 UNITS tablet Take 1,000 Units by mouth daily.    . Glucosamine-Chondroit-Vit C-Mn (GLUCOSAMINE CHONDR 1500 COMPLX) CAPS Take 1 capsule by mouth daily.     . irbesartan (AVAPRO) 300 MG tablet Take 300 mg by mouth daily.    Marland Kitchen levothyroxine (SYNTHROID, LEVOTHROID) 88 MCG tablet Take 88 mcg by mouth daily before breakfast.      . magic mouthwash SOLN Take 10 mLs by mouth 3 (three) times daily. Swish and swallow 140 mL 0  . Multiple Vitamin (MULTIVITAMIN) tablet Take 1 tablet by mouth every Monday, Wednesday, and Friday.     Marland Kitchen omeprazole (PRILOSEC) 20 MG capsule TAKE 1 CAPSULE DAILY 90 capsule 4  . Spacer/Aero-Holding Chambers (AEROCHAMBER MV) inhaler Use as instructed 1 each 0  . umeclidinium bromide (INCRUSE ELLIPTA) 62.5 MCG/INH AEPB Inhale 1 puff into the lungs daily. 30 each 5   No current facility-administered medications on file prior to visit.

## 2018-04-19 NOTE — Telephone Encounter (Signed)
  Per Dr. Annamaria Boots- D/C [place] vest at patient request  Suggest O2 2L at rest and sleep,  3 L with exertion   Called and spoke with Patient's caregiver.  Dr. Janee Morn recommendations given.  Understanding stated. Nothing further at this time.

## 2018-04-19 NOTE — Telephone Encounter (Signed)
D/C [place] vest at patient request  Suggest O2 2L at rest and sleep,  3 L with exertion

## 2018-05-30 ENCOUNTER — Other Ambulatory Visit: Payer: Self-pay | Admitting: Family Medicine

## 2018-05-30 ENCOUNTER — Ambulatory Visit
Admission: RE | Admit: 2018-05-30 | Discharge: 2018-05-30 | Disposition: A | Discharge status: Home / Self Care | Payer: Medicare Other | Source: Ambulatory Visit | Attending: Family Medicine | Admitting: Family Medicine

## 2018-05-30 DIAGNOSIS — J449 Chronic obstructive pulmonary disease, unspecified: Secondary | ICD-10-CM

## 2018-05-31 ENCOUNTER — Telehealth: Payer: Self-pay | Admitting: Internal Medicine

## 2018-05-31 NOTE — Telephone Encounter (Signed)
Called and spoke with Patient's daughter, Hilda Blades.  She stated that her Mother had a recent CXR that showed bronchitis. They are wanting to follow up with Dr. Annamaria Boots, at his first available appointment.  OV scheduled for 06/04/18 at 0900, per Patient request.  Nothing further at this time.

## 2018-06-04 ENCOUNTER — Ambulatory Visit (INDEPENDENT_AMBULATORY_CARE_PROVIDER_SITE_OTHER): Payer: Medicare Other | Admitting: Internal Medicine

## 2018-06-04 ENCOUNTER — Encounter: Payer: Self-pay | Admitting: Internal Medicine

## 2018-06-04 VITALS — BP 110/72 | HR 101 | Ht 63.0 in | Wt 83.0 lb

## 2018-06-04 DIAGNOSIS — J441 Chronic obstructive pulmonary disease with (acute) exacerbation: Secondary | ICD-10-CM

## 2018-06-04 DIAGNOSIS — J3800 Paralysis of vocal cords and larynx, unspecified: Secondary | ICD-10-CM

## 2018-06-04 DIAGNOSIS — J9621 Acute and chronic respiratory failure with hypoxia: Secondary | ICD-10-CM | POA: Diagnosis not present

## 2018-06-04 MED ORDER — HYDROCOD POLST-CPM POLST ER 10-8 MG/5ML PO SUER: ORAL | 0 refills | Status: DC

## 2018-06-04 NOTE — Progress Notes (Signed)
Patient ID: [name], female    DOB: 1931-04-12, 84 y.o.   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 Per patient, she is currently on 3L of O2. Having to be on O2 has been a lifestyle change for her.  InCruse Ellipta, Dymista nasal spray, She considers oxygen a big help and feels most comfortable at 3 L especially with any exertion at all. Uses amitriptyline and one half Xanax tablet at night to help sleep.  I explained this may keep her from noticing reflux and cough events at night.  She elevates head of bed but we consider chronic reflux through her paralyzed vocal cords, as the primary cause of her bronchiectasis.  She is not very strong and cough for airway clearance is not very effective.  Flutter device did not help much.  We discussed trying a pneumatic vest which I do believe would help her.  Chronic productive cough has been going on for years despite multiple antibiotics.  06/04/2018- 84 year old female never smoker followed for Bronchiectasis with history MAIC-treated, intermittent hemoptysis, complicated by chronic vocal cord paresis, Hashimoto's thyroiditis, GERD/ Aspiration, insomnia -----Per patient, she has been diagnosed with bronchitis per her PCP Dr. Dorthy Cooler. Had a CXR on 05/30/18. Incruse elipta, Dymista, Qvar Redihaler 40,  Follow on day 8/10 Levaquin and a prednisone  taper.  Sputum still purulent by her description without fever.  Admits to hot spells.  Could not tolerate pneumatic vest. CXR 05/30/18 IMPRESSION: Significantly increased coarse densities are noted throughout both lungs compared to prior radiograph, most likely representing postinfectious scarring or other sequela of prior atypical inflammation as described on prior CT scan. Acute superimposed inflammation cannot be excluded. Aortic Atherosclerosis (ICD10-I70.0).  ROS-see HPI + = positive Constitutional:   No-   weight loss, +night sweats, fevers, chills, fatigue, lassitude. HEENT:   No-  headaches, difficulty swallowing, tooth/dental problems, sore throat, + chronic hoarseness      No-  sneezing, itching, ear ache, nasal congestion, post nasal drip,  CV:  No-   chest pain, orthopnea, PND, swelling in lower extremities, anasarca,  dizziness, palpitations Resp: +  shortness of breath with exertion or at rest.              + productive cough,  No non-productive cough,  + coughing up of blood.            +  change in color of mucus.  No- wheezing.   Skin: No-   rash or lesions. GI:  + heartburn, indigestion, No-abdominal pain, nausea, No-vomiting,  GU: MS:  No-   joint pain or swelling. + Back pain   Neuro-     nothing unusual Psych:  No- change in mood or affect. No depression or anxiety.  No memory loss.  Objective:   Physical Exam General- Alert, Oriented, Affect-appropriate, Distress- none acute + elderly/frail, wheelchair, +O2 3 L Skin- rash-none, lesions- none, excoriation- none Lymphadenopathy- none Head- atraumatic + wig            Eyes- Gross vision intact, PERRLA, conjunctivae clear secretions  Ears- Hearing, canals normal            Nose- Clear, No-Septal dev, mucus, polyps, erosion, perforation             Throat- Mallampati II , mucosa + mild thrush, drainage- none, tonsils- atrophy, +chronic hoarseness Neck- flexible , trachea midline, no stridor , thyroid nl,  carotid no bruit Chest - symmetrical excursion , unlabored           Heart/CV- RRR now , no murmur , no gallop  , no rub, nl s1 s2                           - JVD- none , edema + trace, stasis changes- none, varices- none           Lung- + rhonchi scattered, unlabored, wheeze- none, cough+light , dullness-none,  rub- none           Chest wall-  Abd-  Br/ Gen/ Rectal- Not done, not indicated Extrem- cyanosis- none, clubbing, none, atrophy- none, strength- nl Neuro- grossly intact to observation

## 2018-06-04 NOTE — Patient Instructions (Signed)
Finish the antibiotic as planned  Call us next week if you aren't doing well  Script refilling Tussionex to use cuatiously

## 2018-06-10 ENCOUNTER — Telehealth: Payer: Self-pay | Admitting: Internal Medicine

## 2018-06-10 MED ORDER — AZITHROMYCIN 250 MG PO TABS: ORAL_TABLET | ORAL | 0 refills | Status: DC

## 2018-06-10 NOTE — Telephone Encounter (Signed)
Called and spoke with pt letting her know that CY stated we could call in an Rx of zpak to see if this would help with her symptoms. Stated to pt after the abx, if she was still no better that we would probably have to schedule her an appt.  Pt expressed understanding. I verified pt's preferred pharmacy and sent Rx in for pt. Nothing further needed.

## 2018-06-10 NOTE — Telephone Encounter (Signed)
Offer Zpak  250 mg, # 6, 2 today then one daily  Stay well hydrated, warm and rested.

## 2018-06-10 NOTE — Telephone Encounter (Signed)
Spoke with pt, states that she is still c/o sore throat, hoarseness, prod cough with little yellow mucus.   Denies fever, chest pain, sinus congestion.   States that she is feeling some better but not back to baseline.  Would like additional recs.    Has been taking mucinex to help with s/s.    [facility] [address].  CY please advise on recs.  Thanks.

## 2018-06-14 ENCOUNTER — Telehealth: Payer: Self-pay | Admitting: Internal Medicine

## 2018-06-14 MED ORDER — AMOXICILLIN-POT CLAVULANATE 500-125 MG PO TABS: 1.0000 | ORAL_TABLET | Freq: Two times a day (BID) | ORAL | 0 refills | Status: DC

## 2018-06-14 NOTE — Telephone Encounter (Signed)
Suggest augmentin 500 mg, #  14, 1 twice daily  Stay well hydrated  If not better by Tuesday we may need to be checking xrays and lab work to see what is going on.

## 2018-06-14 NOTE — Telephone Encounter (Signed)
Called pt and advised message from the provider. Pt understood and verbalized understanding. Nothing further is needed.   Rx sent in to [facility] [address].

## 2018-06-14 NOTE — Telephone Encounter (Signed)
Called and spoke to patient, patient states she finished the zpack this morning and she still has a earache and pains in her face. She states she has a sore throat, denies SOB or chest discomfort. She states her cough has improved but the sinus congestion, earache, and facial pains remains. She states she took her fever and it read 100.0 but she does not feel it can be trusted because her thermometer is old.   CY please advise. Thanks.

## 2018-06-14 NOTE — Telephone Encounter (Signed)
Pt called back stating she needs something for pain to help due ? Sinus infection. CY gave verbal to have patient use heating pad and Tylenol OTC as other pain meds we would give her can cause interactions. Pt to keep OV on Monday 06-17-18.   Pt aware of recs and appt on Monday.

## 2018-06-17 ENCOUNTER — Ambulatory Visit: Payer: Medicare Other | Admitting: Internal Medicine

## 2018-07-29 ENCOUNTER — Other Ambulatory Visit: Payer: Self-pay | Admitting: Internal Medicine

## 2018-08-01 ENCOUNTER — Other Ambulatory Visit: Payer: Self-pay | Admitting: Internal Medicine

## 2018-08-02 ENCOUNTER — Telehealth: Payer: Self-pay | Admitting: Internal Medicine

## 2018-08-02 MED ORDER — ALBUTEROL SULFATE 108 (90 BASE) MCG/ACT IN AEPB: 2.0000 | INHALATION_SPRAY | Freq: Four times a day (QID) | RESPIRATORY_TRACT | 5 refills | Status: AC | PRN

## 2018-08-02 MED ORDER — BECLOMETHASONE DIPROP HFA 40 MCG/ACT IN AERB: INHALATION_SPRAY | RESPIRATORY_TRACT | 1 refills | Status: AC

## 2018-08-02 NOTE — Telephone Encounter (Signed)
Rx's have been sent to pharmacy as requested and daughter/with pt next to her on phone is aware; also made apt for 09-05-18 at 4:30pm as follow up due. Nothing more needed at this time.

## 2018-08-11 NOTE — Assessment & Plan Note (Signed)
Chronic LPR-aspiration precautions reemphasized.

## 2018-08-11 NOTE — Assessment & Plan Note (Signed)
She remains oxygen dependent 

## 2018-08-11 NOTE — Assessment & Plan Note (Signed)
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 Current exacerbation nonspecific.  Levaquin is appropriate  Non-Tussionex refilled with appropriate discussion-reflecting her age and gender, potential for oversedation.

## 2018-09-05 ENCOUNTER — Ambulatory Visit: Payer: Medicare Other | Admitting: Internal Medicine

## 2018-11-15 ENCOUNTER — Other Ambulatory Visit (INDEPENDENT_AMBULATORY_CARE_PROVIDER_SITE_OTHER): Payer: Self-pay | Admitting: Orthopaedic Surgery

## 2018-11-15 NOTE — Telephone Encounter (Signed)
Ok for refill? 

## 2018-11-19 ENCOUNTER — Telehealth: Payer: Self-pay | Admitting: Orthopaedic Surgery

## 2018-11-19 NOTE — Telephone Encounter (Signed)
[name] called to see if [name] can prescribe an alternative medication for the  Celecoxib.  Please call him back before 5 p.m

## 2018-11-19 NOTE — Telephone Encounter (Signed)
Per previous message in chart, patient cannot take any other Tier 1 medications due to coughing up blood with CHF.  Per Dr. Lorin Mercy, continue Celebrex.

## 2018-11-19 NOTE — Telephone Encounter (Signed)
I called [facility] and explained patient cannot take any Tier 1 medications. Prior Josem Kaufmann will need to be done through Cover My Meds.  Key code given as [id number].  They will put hold on script until authorization comes through.

## 2018-11-19 NOTE — Telephone Encounter (Signed)
Please advise. It appears pharmacy is asking for alternative medication.

## 2018-11-19 NOTE — Telephone Encounter (Signed)
Prior auth submitted to Cover My Meds.  Approved for 10/20/2018-11/19/2019. Case ID [id number]?      Your request has been approved  [id number];Review Type:Prior Auth;Coverage Start Date:10/20/2018;Coverage End Date:11/19/2019

## 2018-11-19 NOTE — Telephone Encounter (Signed)
OK mobic 15 mg daily with refills

## 2018-12-22 ENCOUNTER — Inpatient Hospital Stay (HOSPITAL_COMMUNITY)
Admission: EM | Admit: 2018-12-22 | Discharge: 2018-12-28 | DRG: 177 | Disposition: A | Discharge status: Home Health | Payer: Medicare Other | Attending: Internal Medicine | Admitting: Internal Medicine

## 2018-12-22 ENCOUNTER — Encounter (HOSPITAL_COMMUNITY): Payer: Self-pay

## 2018-12-22 ENCOUNTER — Emergency Department (HOSPITAL_COMMUNITY): Payer: Medicare Other

## 2018-12-22 ENCOUNTER — Other Ambulatory Visit: Payer: Self-pay

## 2018-12-22 DIAGNOSIS — Z7951 Long term (current) use of inhaled steroids: Secondary | ICD-10-CM

## 2018-12-22 DIAGNOSIS — J440 Chronic obstructive pulmonary disease with acute lower respiratory infection: Secondary | ICD-10-CM | POA: Diagnosis present

## 2018-12-22 DIAGNOSIS — Z7952 Long term (current) use of systemic steroids: Secondary | ICD-10-CM

## 2018-12-22 DIAGNOSIS — J441 Chronic obstructive pulmonary disease with (acute) exacerbation: Secondary | ICD-10-CM | POA: Diagnosis present

## 2018-12-22 DIAGNOSIS — E039 Hypothyroidism, unspecified: Secondary | ICD-10-CM | POA: Diagnosis present

## 2018-12-22 DIAGNOSIS — E43 Unspecified severe protein-calorie malnutrition: Secondary | ICD-10-CM | POA: Diagnosis present

## 2018-12-22 DIAGNOSIS — I700 Atherosclerosis of aorta: Secondary | ICD-10-CM | POA: Diagnosis present

## 2018-12-22 DIAGNOSIS — R0902 Hypoxemia: Secondary | ICD-10-CM

## 2018-12-22 DIAGNOSIS — J9621 Acute and chronic respiratory failure with hypoxia: Secondary | ICD-10-CM | POA: Diagnosis present

## 2018-12-22 DIAGNOSIS — B954 Other streptococcus as the cause of diseases classified elsewhere: Secondary | ICD-10-CM | POA: Diagnosis present

## 2018-12-22 DIAGNOSIS — E876 Hypokalemia: Secondary | ICD-10-CM | POA: Diagnosis present

## 2018-12-22 DIAGNOSIS — Z9981 Dependence on supplemental oxygen: Secondary | ICD-10-CM | POA: Diagnosis not present

## 2018-12-22 DIAGNOSIS — R64 Cachexia: Secondary | ICD-10-CM | POA: Diagnosis present

## 2018-12-22 DIAGNOSIS — Z825 Family history of asthma and other chronic lower respiratory diseases: Secondary | ICD-10-CM

## 2018-12-22 DIAGNOSIS — K5900 Constipation, unspecified: Secondary | ICD-10-CM | POA: Diagnosis present

## 2018-12-22 DIAGNOSIS — Z886 Allergy status to analgesic agent status: Secondary | ICD-10-CM | POA: Diagnosis not present

## 2018-12-22 DIAGNOSIS — Z881 Allergy status to other antibiotic agents status: Secondary | ICD-10-CM | POA: Diagnosis not present

## 2018-12-22 DIAGNOSIS — L89152 Pressure ulcer of sacral region, stage 2: Secondary | ICD-10-CM | POA: Diagnosis present

## 2018-12-22 DIAGNOSIS — E871 Hypo-osmolality and hyponatremia: Secondary | ICD-10-CM | POA: Diagnosis not present

## 2018-12-22 DIAGNOSIS — D638 Anemia in other chronic diseases classified elsewhere: Secondary | ICD-10-CM | POA: Diagnosis present

## 2018-12-22 DIAGNOSIS — J310 Chronic rhinitis: Secondary | ICD-10-CM | POA: Diagnosis present

## 2018-12-22 DIAGNOSIS — J984 Other disorders of lung: Secondary | ICD-10-CM

## 2018-12-22 DIAGNOSIS — I340 Nonrheumatic mitral (valve) insufficiency: Secondary | ICD-10-CM | POA: Diagnosis not present

## 2018-12-22 DIAGNOSIS — J3800 Paralysis of vocal cords and larynx, unspecified: Secondary | ICD-10-CM

## 2018-12-22 DIAGNOSIS — J471 Bronchiectasis with (acute) exacerbation: Secondary | ICD-10-CM | POA: Diagnosis present

## 2018-12-22 DIAGNOSIS — E860 Dehydration: Secondary | ICD-10-CM | POA: Diagnosis present

## 2018-12-22 DIAGNOSIS — F418 Other specified anxiety disorders: Secondary | ICD-10-CM | POA: Diagnosis not present

## 2018-12-22 DIAGNOSIS — E890 Postprocedural hypothyroidism: Secondary | ICD-10-CM | POA: Diagnosis present

## 2018-12-22 DIAGNOSIS — R7881 Bacteremia: Secondary | ICD-10-CM | POA: Diagnosis present

## 2018-12-22 DIAGNOSIS — K219 Gastro-esophageal reflux disease without esophagitis: Secondary | ICD-10-CM | POA: Diagnosis present

## 2018-12-22 DIAGNOSIS — Z8701 Personal history of pneumonia (recurrent): Secondary | ICD-10-CM | POA: Diagnosis not present

## 2018-12-22 DIAGNOSIS — Z888 Allergy status to other drugs, medicaments and biological substances status: Secondary | ICD-10-CM | POA: Diagnosis not present

## 2018-12-22 DIAGNOSIS — J449 Chronic obstructive pulmonary disease, unspecified: Secondary | ICD-10-CM | POA: Diagnosis present

## 2018-12-22 DIAGNOSIS — Z681 Body mass index (BMI) 19 or less, adult: Secondary | ICD-10-CM

## 2018-12-22 DIAGNOSIS — I10 Essential (primary) hypertension: Secondary | ICD-10-CM | POA: Diagnosis not present

## 2018-12-22 DIAGNOSIS — Z79890 Hormone replacement therapy (postmenopausal): Secondary | ICD-10-CM

## 2018-12-22 DIAGNOSIS — R1313 Dysphagia, pharyngeal phase: Secondary | ICD-10-CM | POA: Diagnosis present

## 2018-12-22 DIAGNOSIS — B955 Unspecified streptococcus as the cause of diseases classified elsewhere: Secondary | ICD-10-CM | POA: Diagnosis present

## 2018-12-22 DIAGNOSIS — J690 Pneumonitis due to inhalation of food and vomit: Secondary | ICD-10-CM | POA: Diagnosis present

## 2018-12-22 DIAGNOSIS — Z20828 Contact with and (suspected) exposure to other viral communicable diseases: Secondary | ICD-10-CM | POA: Diagnosis present

## 2018-12-22 DIAGNOSIS — J189 Pneumonia, unspecified organism: Secondary | ICD-10-CM

## 2018-12-22 DIAGNOSIS — J154 Pneumonia due to other streptococci: Secondary | ICD-10-CM | POA: Diagnosis not present

## 2018-12-22 DIAGNOSIS — A310 Pulmonary mycobacterial infection: Secondary | ICD-10-CM | POA: Diagnosis present

## 2018-12-22 DIAGNOSIS — Z8249 Family history of ischemic heart disease and other diseases of the circulatory system: Secondary | ICD-10-CM

## 2018-12-22 DIAGNOSIS — D649 Anemia, unspecified: Secondary | ICD-10-CM | POA: Diagnosis present

## 2018-12-22 DIAGNOSIS — L8990 Pressure ulcer of unspecified site, unspecified stage: Secondary | ICD-10-CM | POA: Diagnosis present

## 2018-12-22 DIAGNOSIS — Z79899 Other long term (current) drug therapy: Secondary | ICD-10-CM

## 2018-12-22 LAB — CBC WITH DIFFERENTIAL/PLATELET
Abs Immature Granulocytes: 0.04 10*3/uL (ref 0.00–0.07)
Basophils Absolute: 0.1 10*3/uL (ref 0.0–0.1)
Basophils Relative: 1 %
Eosinophils Absolute: 0.7 10*3/uL — ABNORMAL HIGH (ref 0.0–0.5)
Eosinophils Relative: 7 %
HCT: 33.3 % — ABNORMAL LOW (ref 36.0–46.0)
Hemoglobin: 10.6 g/dL — ABNORMAL LOW (ref 12.0–15.0)
Immature Granulocytes: 0 %
Lymphocytes Relative: 9 %
Lymphs Abs: 1 10*3/uL (ref 0.7–4.0)
MCH: 28.2 pg (ref 26.0–34.0)
MCHC: 31.8 g/dL (ref 30.0–36.0)
MCV: 88.6 fL (ref 80.0–100.0)
Monocytes Absolute: 1 10*3/uL (ref 0.1–1.0)
Monocytes Relative: 10 %
Neutro Abs: 7.7 10*3/uL (ref 1.7–7.7)
Neutrophils Relative %: 73 %
Platelets: 178 10*3/uL (ref 150–400)
RBC: 3.76 MIL/uL — ABNORMAL LOW (ref 3.87–5.11)
RDW: 15.1 % (ref 11.5–15.5)
WBC: 10.5 10*3/uL (ref 4.0–10.5)
nRBC: 0 % (ref 0.0–0.2)

## 2018-12-22 LAB — BASIC METABOLIC PANEL
Anion gap: 8 (ref 5–15)
BUN: 12 mg/dL (ref 8–23)
CO2: 35 mmol/L — ABNORMAL HIGH (ref 22–32)
Calcium: 8.2 mg/dL — ABNORMAL LOW (ref 8.9–10.3)
Chloride: 90 mmol/L — ABNORMAL LOW (ref 98–111)
Creatinine, Ser: 0.53 mg/dL (ref 0.44–1.00)
GFR calc Af Amer: >60 mL/min (ref >60)
GFR calc non Af Amer: >60 mL/min (ref >60)
Glucose, Bld: 139 mg/dL — ABNORMAL HIGH (ref 70–99)
Potassium: 3.6 mmol/L (ref 3.5–5.1)
Sodium: 133 mmol/L — ABNORMAL LOW (ref 135–145)

## 2018-12-22 LAB — LACTIC ACID, PLASMA: Lactic Acid, Venous: 1.1 mmol/L (ref 0.5–1.9)

## 2018-12-22 LAB — TROPONIN I: Troponin I: <0.03 ng/mL (ref <0.03)

## 2018-12-22 LAB — BRAIN NATRIURETIC PEPTIDE: B Natriuretic Peptide: 117 pg/mL — ABNORMAL HIGH (ref 0.0–100.0)

## 2018-12-22 LAB — SARS CORONAVIRUS 2 BY RT PCR (HOSPITAL ORDER, PERFORMED IN ~~LOC~~ HOSPITAL LAB): SARS Coronavirus 2: NEGATIVE

## 2018-12-22 MED ORDER — ALBUTEROL SULFATE HFA 108 (90 BASE) MCG/ACT IN AERS
8.0000 | INHALATION_SPRAY | RESPIRATORY_TRACT | Status: AC
  Administered 2018-12-22: 8 via RESPIRATORY_TRACT
  Filled 2018-12-22: qty 6.7

## 2018-12-22 MED ORDER — IPRATROPIUM-ALBUTEROL 0.5-2.5 (3) MG/3ML IN SOLN
3.0000 mL | Freq: Once | RESPIRATORY_TRACT | Status: AC
  Administered 2018-12-22: 3 mL via RESPIRATORY_TRACT
  Filled 2018-12-22: qty 3

## 2018-12-22 MED ORDER — LEVOFLOXACIN IN D5W 750 MG/150ML IV SOLN
750.0000 mg | INTRAVENOUS | Status: DC
  Administered 2018-12-23: 750 mg via INTRAVENOUS
  Filled 2018-12-22: qty 150

## 2018-12-22 MED ORDER — ALBUTEROL SULFATE (2.5 MG/3ML) 0.083% IN NEBU
2.5000 mg | INHALATION_SOLUTION | Freq: Once | RESPIRATORY_TRACT | Status: AC
  Administered 2018-12-22: 2.5 mg via RESPIRATORY_TRACT
  Filled 2018-12-22: qty 3

## 2018-12-22 NOTE — ED Provider Notes (Signed)
[facility] DEPT Provider Note   CSN: [id number] Arrival date & time: 12/22/18  1842     History   Chief Complaint Chief Complaint  Patient presents with   Shortness of Breath    COPD pt   Cough    HPI [name] is a 85 y.o. female.     HPI  Pt was seen at [place].  Per pt, c/o gradual onset and worsening of persistent cough and SOB for the past 1 week. Describes her sputum as "dark brown."  Describes her symptoms as "I think I have pneumonia."  Has been using home O2 and MDI/nebs without relief.  Denies hemoptysis, no CP/palpitations, no back pain, no abd pain, no N/V/D, no fevers, no rash.     Past Medical History:  Diagnosis Date   Bronchiectasis    COPD (chronic obstructive pulmonary disease) ([place])    Depression with anxiety    Essential hypertension    GERD (gastroesophageal reflux disease)    Hashimoto's thyroiditis    Hypothyroidism    Vocal cord paralysis     Patient Active Problem List   Diagnosis Date Noted   Acute on chronic respiratory failure with hypoxia ([place]) 06/23/2017   Lobar pneumonia ([place]) 06/23/2017   LBBB (left bundle branch block) 06/23/2017   Hyponatremia 06/23/2017   Hypokalemia 06/23/2017   Sepsis ([place]) 06/23/2017   Essential hypertension    COPD with acute exacerbation ([place])    Depression with anxiety    Hypothyroidism    Rhinitis, chronic 04/20/2014   GERD (gastroesophageal reflux disease) 03/30/2013   Protein calorie malnutrition ([place]) 11/28/2011   HASHIMOTO'S THYROIDITIS 09/10/2007   Vocal cord paresis 09/10/2007   Pneumonia due to other specified bacteria ([place]) 09/10/2007   BRONCHIECTASIS 09/10/2007    Past Surgical History:  Procedure Laterality Date   APPENDECTOMY     HEMORRHOID SURGERY     hysteroscopy x 2     THYROIDECTOMY     TUBAL LIGATION       OB History   No obstetric history on file.      Home Medications    Prior to Admission  medications   Medication Sig Start Date End Date Taking? Authorizing Provider  Albuterol Sulfate (PROAIR RESPICLICK) 258 (90 Base) MCG/ACT AEPB Inhale 2 puffs into the lungs every 6 (six) hours as needed. 08/02/18   Deneise Lever, MD  ALPRAZolam Duanne Moron) 0.5 MG tablet Take 0.25-0.5 mg by mouth 2 (two) times daily as needed for anxiety.    [provider]  amitriptyline (ELAVIL) 50 MG tablet Take 50 mg by mouth at bedtime.     [provider]  amLODipine (NORVASC) 5 MG tablet Take 5 mg by mouth every evening.     [provider]  amoxicillin-clavulanate (AUGMENTIN) 500-125 MG tablet Take 1 tablet (500 mg total) by mouth 2 (two) times daily. 06/14/18   Baird Lyons D, MD  Azelastine-Fluticasone [facility]) 137-50 MCG/ACT SUSP 1-2 puffs each nostril daily if needed 02/14/18   Baird Lyons D, MD  azithromycin (ZITHROMAX) 250 MG tablet Take 2 today and then 1 daily until finished. 06/10/18   Deneise Lever, MD  beclomethasone (QVAR REDIHALER) 40 MCG/ACT inhaler USE 2 INHALATIONS TWICE A DAY 08/02/18   Young, Tarri Fuller D, MD  Calcium Carbonate-Vit D-Min (CALCIUM 1200 PO) Take 1 capsule by mouth every other day.     [provider]  celecoxib (CELEBREX) 200 MG capsule TAKE 1 CAPSULE TWICE A DAY 11/16/18   Lorin Mercy,  Thana Farr, MD  chlorpheniramine-HYDROcodone (TUSSIONEX PENNKINETIC ER) 10-8 MG/5ML SUER 1 teaspoon every 12 hours if needed for cough 06/04/18   Baird Lyons D, MD  cholecalciferol (VITAMIN D) 1000 UNITS tablet Take 1,000 Units by mouth daily.    [provider]  Glucosamine-Chondroit-Vit C-Mn (GLUCOSAMINE CHONDR 1500 COMPLX) CAPS Take 1 capsule by mouth daily.     [provider]  INCRUSE ELLIPTA 62.5 MCG/INH AEPB TAKE 1 PUFF BY MOUTH EVERY DAY 07/29/18   Young, Tarri Fuller D, MD  irbesartan (AVAPRO) 300 MG tablet Take 300 mg by mouth daily. 05/15/17   [provider]  levofloxacin (LEVAQUIN) 500 MG tablet  05/29/18   [provider]    levothyroxine (SYNTHROID, LEVOTHROID) 88 MCG tablet Take 88 mcg by mouth daily before breakfast.     [provider]  magic mouthwash SOLN Take 10 mLs by mouth 3 (three) times daily. Swish and swallow 11/23/17   Baird Lyons D, MD  Multiple Vitamin (MULTIVITAMIN) tablet Take 1 tablet by mouth every Monday, Wednesday, and Friday.     [provider]  omeprazole (PRILOSEC) 20 MG capsule TAKE 1 CAPSULE DAILY 04/02/18   Deneise Lever, MD  predniSONE (DELTASONE) 10 MG tablet  05/29/18   [provider]  Spacer/Aero-Holding Josiah Lobo (AEROCHAMBER MV) inhaler Use as instructed 04/02/13   Deneise Lever, MD    Family History Family History  Problem Relation Age of Onset   COPD Father    Hypertension Father     Social History Social History   Tobacco Use   Smoking status: Never Smoker   Smokeless tobacco: Never Used  Substance Use Topics   Alcohol use: Not Currently   Drug use: Never     Allergies   Ambien [zolpidem], Asa [aspirin], Boniva [ibandronic acid], Cystex [germanium], and Doxycycline   Review of Systems Review of Systems ROS: Statement: All systems negative except as marked or noted in the HPI; Constitutional: Negative for fever and chills. ; ; Eyes: Negative for eye pain, redness and discharge. ; ; ENMT: Negative for ear pain, hoarseness, nasal congestion, sinus pressure and sore throat. ; ; Cardiovascular: Negative for chest pain, palpitations, diaphoresis, and peripheral edema. ; ; Respiratory: +SOB, cough. Negative for wheezing and stridor. ; ; Gastrointestinal: Negative for nausea, vomiting, diarrhea, abdominal pain, blood in stool, hematemesis, jaundice and rectal bleeding. . ; ; Genitourinary: Negative for dysuria, flank pain and hematuria. ; ; Musculoskeletal: Negative for back pain and neck pain. Negative for swelling and trauma.; ; Skin: Negative for pruritus, rash, abrasions, blisters, bruising and skin lesion.; ; Neuro: Negative for  headache, lightheadedness and neck stiffness. Negative for weakness, altered level of consciousness, altered mental status, extremity weakness, paresthesias, involuntary movement, seizure and syncope.       Physical Exam Updated Vital Signs BP (!) 152/72    Pulse 84    Temp 98.4 F (36.9 C) (Oral)    Resp 20    Ht 5\' 3"  (1.6 m)    Wt 34.9 kg    SpO2 99%    BMI 13.64 kg/m    Patient Vitals for the past 24 hrs:  BP Temp Temp src Pulse Resp SpO2 Height Weight  12/22/18 2035 -- -- -- 84 20 99 % -- --  12/22/18 2030 -- -- -- 87 20 99 % -- --  12/22/18 1945 -- -- -- 89 (!) 25 99 % -- --  12/22/18 1930 (!) 152/72 -- -- 88 (!) 24 99 % -- --  12/22/18 1904 -- -- -- -- -- -- 5\' 3"  (1.6 m) 34.9 kg  12/22/18 1903 (!) 146/66 -- -- 88 16 100 % -- --  12/22/18 1900 (!) 146/66 -- -- 88 (!) 22 100 % -- --  12/22/18 1858 (!) 141/67 98.4 F (36.9 C) Oral 86 18 100 % -- --  12/22/18 1852 -- -- -- -- -- (!) 88 % -- --     Physical Exam 1925: Physical examination:  Nursing notes reviewed; Vital signs and O2 SAT reviewed;  Constitutional: Well developed, Well nourished, Uncomfortable appearing.; Head:  Normocephalic, atraumatic; Eyes: EOMI, PERRL, No scleral icterus; ENMT: Mouth and pharynx normal, Mucous membranes dry; Neck: Supple, Full range of motion, No lymphadenopathy; Cardiovascular: Regular rate and rhythm, No gallop; Respiratory: Breath sounds diminished, coase & equal bilaterally, No wheezes. Speaking short sentences, mildly tachypneic. Normal respiratory effort/excursion; Chest: Nontender, Movement normal; Abdomen: Soft, Nontender, Nondistended, Normal bowel sounds; Genitourinary: No CVA tenderness; Extremities: Peripheral pulses normal, No tenderness, No edema, No calf edema or asymmetry.; Neuro: AA&Ox3, No facial droop.  Speech clear. No gross focal motor or sensory deficits in extremities.; Skin: Color normal, Warm, Dry.   ED Treatments / Results  Labs (all labs ordered are listed, but only  abnormal results are displayed)   EKG EKG Interpretation  Date/Time:  Sunday December 22 2018 19:09:05 EDT Ventricular Rate:  90 PR Interval:    QRS Duration: 132 QT Interval:  397 QTC Calculation: 486 R Axis:   -25 Text Interpretation:  Sinus rhythm Left bundle branch block When compared with ECG of 06/25/2017 Rate slower Otherwise no significant change Confirmed by Francine Graven [phone]) on 12/22/2018 8:52:30 PM   Radiology   Procedures Procedures (including critical care time)  Medications Ordered in ED Medications  ipratropium-albuterol (DUONEB) 0.5-2.5 (3) MG/3ML nebulizer solution 3 mL (has no administration in time range)  albuterol (PROVENTIL) (2.5 MG/3ML) 0.083% nebulizer solution 2.5 mg (has no administration in time range)  levofloxacin (LEVAQUIN) IVPB 750 mg (has no administration in time range)  albuterol (VENTOLIN HFA) 108 (90 Base) MCG/ACT inhaler 8 puff (8 puffs Inhalation Given 12/22/18 1940)     Initial Impression / Assessment and Plan / ED Course  I have reviewed the triage vital signs and the nursing notes.  Pertinent labs & imaging results that were available during my care of the patient were reviewed by me and considered in my medical decision making (see chart for details).     MDM Reviewed: previous chart, nursing note and vitals Reviewed previous: labs and ECG Interpretation: labs, ECG, x-ray and CT scan   Results for orders placed or performed during the hospital encounter of 12/22/18  SARS Coronavirus 2 (CEPHEID - Performed in [place] hospital lab), [facility] Order   Specimen: Nasopharyngeal Swab  Result Value Ref Range   SARS Coronavirus 2 NEGATIVE NEGATIVE  Brain natriuretic peptide  Result Value Ref Range   B Natriuretic Peptide 117.0 (H) 0.0 - 100.0 pg/mL  Troponin I - Once  Result Value Ref Range   Troponin I <0.03 <0.03 ng/mL  Lactic acid, plasma  Result Value Ref Range   Lactic Acid, Venous 1.1 0.5 - 1.9 mmol/L  CBC with  Differential  Result Value Ref Range   WBC 10.5 4.0 - 10.5 K/uL   RBC 3.76 (L) 3.87 - 5.11 MIL/uL   Hemoglobin 10.6 (L) 12.0 - 15.0 g/dL   HCT 33.3 (L) 36.0 - 46.0 %   MCV 88.6 80.0 - 100.0 fL   MCH  28.2 26.0 - 34.0 pg   MCHC 31.8 30.0 - 36.0 g/dL   RDW 15.1 11.5 - 15.5 %   Platelets 178 150 - 400 K/uL   nRBC 0.0 0.0 - 0.2 %   Neutrophils Relative % 73 %   Neutro Abs 7.7 1.7 - 7.7 K/uL   Lymphocytes Relative 9 %   Lymphs Abs 1.0 0.7 - 4.0 K/uL   Monocytes Relative 10 %   Monocytes Absolute 1.0 0.1 - 1.0 K/uL   Eosinophils Relative 7 %   Eosinophils Absolute 0.7 (H) 0.0 - 0.5 K/uL   Basophils Relative 1 %   Basophils Absolute 0.1 0.0 - 0.1 K/uL   Immature Granulocytes 0 %   Abs Immature Granulocytes 0.04 0.00 - 0.07 K/uL  Basic metabolic panel  Result Value Ref Range   Sodium 133 (L) 135 - 145 mmol/L   Potassium 3.6 3.5 - 5.1 mmol/L   Chloride 90 (L) 98 - 111 mmol/L   CO2 35 (H) 22 - 32 mmol/L   Glucose, Bld 139 (H) 70 - 99 mg/dL   BUN 12 8 - 23 mg/dL   Creatinine, Ser 0.53 0.44 - 1.00 mg/dL   Calcium 8.2 (L) 8.9 - 10.3 mg/dL   GFR calc non Af Amer >60 >60 mL/min   GFR calc Af Amer >60 >60 mL/min   Anion gap 8 5 - 15   Dg Chest 2 View Result Date: 12/22/2018 CLINICAL DATA:  Shortness of Breath, cough EXAM: CHEST - 2 VIEW COMPARISON:  05/30/2018 FINDINGS: Severe chronic lung disease with bronchiectasis and fibrosis. Increasing opacity in the left upper lobe, cannot exclude left upper lobe infiltrate/pneumonia. Hyperinflation. Heart is borderline in size. Small left effusion. No acute bony abnormality. IMPRESSION: Severe chronic lung disease with bronchiectasis and fibrosis.  COPD. Borderline heart size. Increasing opacity within the left upper lobe. While this could reflect worsening scarring, cannot exclude acute infiltrate/pneumonia. Small left effusion. Electronically Signed   By: Rolm Baptise M.D.   On: 12/22/2018 20:05   Ct Chest Wo Contrast Result Date:  12/22/2018 CLINICAL DATA:  85 y/o F; Bronchiectasis, acute exacerbation Shortness of breath. EXAM: CT CHEST WITHOUT CONTRAST TECHNIQUE: Multidetector CT imaging of the chest was performed following the standard protocol without IV contrast. COMPARISON:  07/17/2017 CT chest. FINDINGS: Cardiovascular: Normal caliber thoracic aorta and main pulmonary artery. Aortic calcific atherosclerosis. Normal heart size. No pericardial effusion. Mediastinum/Nodes: No enlarged mediastinal or axillary lymph nodes. Thyroid gland, trachea, and esophagus demonstrate no significant findings. Lungs/Pleura: Stable severe cylindrical and varicoid bronchiectasis throughout the lungs. Interval increased diffuse peribronchial wall thickening. Interval development of multiloculated thick-walled pulmonary cavities within the left upper lobe, the largest measuring 34 x 40 x 39 mm (AP x ML x CC series 6, image 49 and series 5, image 28). Partial resolution of ground-glass opacities and patchy consolidations elsewhere throughout the lung. Stable findings of postinflammatory scarring greatest in the lung bases. Upper Abdomen: No acute abnormality. Musculoskeletal: Cachexia. No acute osseous abnormality identified. IMPRESSION: 1. Interval development of multiloculated thick-walled pulmonary cavities within the left upper lobe, measuring up to 4 cm. Findings probably represent cavitary pneumonia. Underlying malignancy cannot be excluded. 2. Partial resolution of ground-glass opacities and patchy consolidations elsewhere throughout the lungs. 3. Stable severe cylindrical and varicoid bronchiectasis throughout the lungs. 4. Interval increased diffuse peribronchial thickening which may represent acute on chronic bronchitis. Electronically Signed   By: Kristine Garbe M.D.   On: 12/22/2018 21:20    [name] was evaluated  in Emergency Department on 12/22/2018 for the symptoms described in the history of present illness. She was evaluated  in the context of the global COVID-19 pandemic, which necessitated consideration that the patient might be at risk for infection with the SARS-CoV-2 virus that causes COVID-19. Institutional protocols and algorithms that pertain to the evaluation of patients at risk for COVID-19 are in a state of rapid change based on information released by regulatory bodies including the CDC and federal and state organizations. These policies and algorithms were followed during the patient's care in the ED.    2150:  CXR with f/u CT scan as above. IV levaquin ordered. Pt given albuterol MDI with O2 Sats increasing from 88% to 99% on her usual O2 3L N/C. T/C returned from [facility] Dr. Jonelle Sidle, case discussed, including:  HPI, pertinent PM/SHx, VS/PE, dx testing, ED course and treatment:  Agreeable to admit, requests to consult ID re: abx and ?possible TB.  2155:  T/C returned from [facility] Dr. Linus Salmons, case discussed, including:  HPI, pertinent PM/SHx, VS/PE, dx testing, ED course and treatment:  Agreeable with abx choice, PO augmentin also a good choice, also need to think of possible aspiration, unless pt has risk factors (jail, travel) CT findings not likely to be TB.  2200:  T/C returned from [facility] Dr. Jonelle Sidle: updated regarding d/w ID MD.     Final Clinical Impressions(s) / ED Diagnoses   Final diagnoses:  None    ED Discharge Orders    None       Francine Graven, DO 12/26/18 [id number]

## 2018-12-22 NOTE — ED Notes (Signed)
Pt ambulated on pulse oximetry roughly 100 feet to the restroom and back. Her initial oxygen saturation was 99%, and she did not drop any lower than 97%. When standing up, she felt dizzy and almost fell over, but was caught before she was able to fall. While ambulating, she reported no dizziness.

## 2018-12-22 NOTE — ED Notes (Signed)
Patient transported to CT 

## 2018-12-22 NOTE — ED Notes (Signed)
Patient transported to X-ray 

## 2018-12-22 NOTE — ED Triage Notes (Signed)
COPD pt arriving [facility] for increased SHOB and productive cough with dark brown sputum. Pt wears home O2 at 3lpm at all times, rhonchi noted in bilateral lungs.

## 2018-12-22 NOTE — H&P (Signed)
History and Physical   [name] QAS:[id number] DOB: [birth year] DOA: 12/22/2018  Referring MD/NP/PA: Dr. Thurnell Garbe  PCP: Lujean Amel, MD   Outpatient Specialists: Dr. Baird Lyons pulmonology  Patient coming from: Home  Chief Complaint: Shortness of breath  HPI: [name] is a 85 y.o. female with medical history significant of bronchiectasis, COPD, recurrent pneumonia, hypertension, hypothyroidism, Hashimoto's thyroiditis and GERD with vocal cord paralysis who presented to the ER with significant shortness of breath and cough.  She has had recurrent pneumonias in the past so patient believes she has another pneumonia.  She has been taking her MDIs and other medications at home with no relief.  In the ER she was found to be hypoxic cachectic woman who is struggling to breathe.  X-rays and CT chest were performed showing cavitary pneumonia which is multiloculated.  Suspected aspiration pneumonitis with abscesses.  Patient has been admitted to the hospital for treatment.  She denied any hemoptysis.  Denied any fever or chills.  Denied any lung injury.  She has chest pain rated as 6 out of 10 occasionally with the coughing..  ED Course: Temperature is 98.4, blood pressure 152/72, pulse 89, respiratory 25, sats 88% on 2 L.  Patient has a white count of 10.5 hemoglobin 10.6 and platelet 178.  Sodium is 133 potassium 3.6 CO2 35 creatinine 1.53 and calcium 8.2.  BNP of 117.  COVID-19 screen is negative.  Chest x-ray showed increasing opacity within the left upper lobe.  Also small left effusion.  CT chest without contrast showed interval development of multiloculated thick-walled pulmonary cavities within the left upper lobe.  This measures about 4 cm.  Findings consistent with cavitary pneumonia and underlying malignancy not excluded.  Also severe bronchiectasis.  Patient is therefore being admitted for further treatment after initiate Levaquin.  Review of Systems: As per HPI otherwise 10  point review of systems negative.    Past Medical History:  Diagnosis Date  . Bronchiectasis   . COPD (chronic obstructive pulmonary disease) ([place])   . Depression with anxiety   . Essential hypertension   . GERD (gastroesophageal reflux disease)   . Hashimoto's thyroiditis   . Hypothyroidism   . Vocal cord paralysis     Past Surgical History:  Procedure Laterality Date  . APPENDECTOMY    . HEMORRHOID SURGERY    . hysteroscopy x 2    . THYROIDECTOMY    . TUBAL LIGATION       reports that she has never smoked. She has never used smokeless tobacco. She reports previous alcohol use. She reports that she does not use drugs.  Allergies  Allergen Reactions  . Ambien [Zolpidem]     Foggy mind-gets up and walks around (pt does not remember anything of the event)  . Asa [Aspirin] Other (See Comments)    Spits up blood  . Boniva [Ibandronic Acid] Other (See Comments)    GI upset  . Cystex [Germanium] Nausea Only  . Doxycycline Nausea Only and Hypertension    Family History  Problem Relation Age of Onset  . COPD Father   . Hypertension Father      Prior to Admission medications   Medication Sig Start Date End Date Taking? Authorizing Provider  Albuterol Sulfate (PROAIR RESPICLICK) 798 (90 Base) MCG/ACT AEPB Inhale 2 puffs into the lungs every 6 (six) hours as needed. 08/02/18   Deneise Lever, MD  ALPRAZolam Duanne Moron) 0.5 MG tablet Take 0.25-0.5 mg by mouth 2 (two) times daily as  needed for anxiety.    [provider]  amitriptyline (ELAVIL) 50 MG tablet Take 50 mg by mouth at bedtime.     [provider]  amLODipine (NORVASC) 5 MG tablet Take 5 mg by mouth every evening.     [provider]  amoxicillin-clavulanate (AUGMENTIN) 500-125 MG tablet Take 1 tablet (500 mg total) by mouth 2 (two) times daily. 06/14/18   Baird Lyons D, MD  Azelastine-Fluticasone [facility]) 137-50 MCG/ACT SUSP 1-2 puffs each nostril daily if needed 02/14/18   Baird Lyons D,  MD  azithromycin (ZITHROMAX) 250 MG tablet Take 2 today and then 1 daily until finished. 06/10/18   Deneise Lever, MD  beclomethasone (QVAR REDIHALER) 40 MCG/ACT inhaler USE 2 INHALATIONS TWICE A DAY 08/02/18   Young, Tarri Fuller D, MD  Calcium Carbonate-Vit D-Min (CALCIUM 1200 PO) Take 1 capsule by mouth every other day.     [provider]  celecoxib (CELEBREX) 200 MG capsule TAKE 1 CAPSULE TWICE A DAY 11/16/18   Marybelle Killings, MD  chlorpheniramine-HYDROcodone [facility] PENNKINETIC ER) 10-8 MG/5ML SUER 1 teaspoon every 12 hours if needed for cough 06/04/18   Baird Lyons D, MD  cholecalciferol (VITAMIN D) 1000 UNITS tablet Take 1,000 Units by mouth daily.    [provider]  Glucosamine-Chondroit-Vit C-Mn (GLUCOSAMINE CHONDR 1500 COMPLX) CAPS Take 1 capsule by mouth daily.     [provider]  INCRUSE ELLIPTA 62.5 MCG/INH AEPB TAKE 1 PUFF BY MOUTH EVERY DAY 07/29/18   Young, Tarri Fuller D, MD  irbesartan (AVAPRO) 300 MG tablet Take 300 mg by mouth daily. 05/15/17   [provider]  levofloxacin (LEVAQUIN) 500 MG tablet  05/29/18   [provider]  levothyroxine (SYNTHROID, LEVOTHROID) 88 MCG tablet Take 88 mcg by mouth daily before breakfast.     [provider]  magic mouthwash SOLN Take 10 mLs by mouth 3 (three) times daily. Swish and swallow 11/23/17   Baird Lyons D, MD  Multiple Vitamin (MULTIVITAMIN) tablet Take 1 tablet by mouth every Monday, Wednesday, and Friday.     [provider]  omeprazole (PRILOSEC) 20 MG capsule TAKE 1 CAPSULE DAILY 04/02/18   Deneise Lever, MD  predniSONE (DELTASONE) 10 MG tablet  05/29/18   [provider]  Spacer/Aero-Holding Chambers (AEROCHAMBER MV) inhaler Use as instructed 04/02/13   Baird Lyons D, MD    Physical Exam: Vitals:   12/22/18 2030 12/22/18 2035 12/22/18 2130 12/22/18 2145  BP:   (!) 143/91   Pulse: 87 84 84 85  Resp: 20 20 19  (!) 21  Temp:      TempSrc:      SpO2: 99%  99% 100% 100%  Weight:      Height:          Constitutional: NAD, cachectic, mildly anxious Vitals:   12/22/18 2030 12/22/18 2035 12/22/18 2130 12/22/18 2145  BP:   (!) 143/91   Pulse: 87 84 84 85  Resp: 20 20 19  (!) 21  Temp:      TempSrc:      SpO2: 99% 99% 100% 100%  Weight:      Height:       Eyes: PERRL, lids and conjunctivae normal ENMT: Mucous membranes are moist. Posterior pharynx clear of any exudate or lesions.Normal dentition.  Neck: normal, supple, no masses, no thyromegaly Respiratory: Decreased air entry bilaterally with egophony, mild expiratory wheezing, mild crackles on the left, increased respiratory effort  Cardiovascular: Regular rate and rhythm, no murmurs /  rubs / gallops. No extremity edema. 2+ pedal pulses. No carotid bruits.  Abdomen: no tenderness, no masses palpated. No hepatosplenomegaly. Bowel sounds positive.  Musculoskeletal: no clubbing / cyanosis. No joint deformity upper and lower extremities. Good ROM, no contractures. Normal muscle tone.  Skin: no rashes, lesions, ulcers. No induration Neurologic: CN 2-12 grossly intact. Sensation intact, DTR normal. Strength 5/5 in all 4.  Psychiatric: Normal judgment and insight. Alert and oriented x 3. Normal mood.     Labs on Admission: I have personally reviewed following labs and imaging studies  CBC: Recent Labs  Lab 12/22/18 1918  WBC 10.5  NEUTROABS 7.7  HGB 10.6*  HCT 33.3*  MCV 88.6  PLT 983   Basic Metabolic Panel: Recent Labs  Lab 12/22/18 2015  NA 133*  K 3.6  CL 90*  CO2 35*  GLUCOSE 139*  BUN 12  CREATININE 0.53  CALCIUM 8.2*   GFR: Estimated Creatinine Clearance: 27.8 mL/min (by C-G formula based on SCr of 0.53 mg/dL). Liver Function Tests: No results for input(s): AST, ALT, ALKPHOS, BILITOT, PROT, ALBUMIN in the last 168 hours. No results for input(s): LIPASE, AMYLASE in the last 168 hours. No results for input(s): AMMONIA in the last 168 hours. Coagulation Profile:  No results for input(s): INR, PROTIME in the last 168 hours. Cardiac Enzymes: Recent Labs  Lab 12/22/18 1918  TROPONINI <0.03   BNP (last 3 results) No results for input(s): PROBNP in the last 8760 hours. HbA1C: No results for input(s): HGBA1C in the last 72 hours. CBG: No results for input(s): GLUCAP in the last 168 hours. Lipid Profile: No results for input(s): CHOL, HDL, LDLCALC, TRIG, CHOLHDL, LDLDIRECT in the last 72 hours. Thyroid Function Tests: No results for input(s): TSH, T4TOTAL, FREET4, T3FREE, THYROIDAB in the last 72 hours. Anemia Panel: No results for input(s): VITAMINB12, FOLATE, FERRITIN, TIBC, IRON, RETICCTPCT in the last 72 hours. Urine analysis: No results found for: COLORURINE, APPEARANCEUR, LABSPEC, PHURINE, GLUCOSEU, HGBUR, BILIRUBINUR, KETONESUR, PROTEINUR, UROBILINOGEN, NITRITE, LEUKOCYTESUR Sepsis Labs: @LABRCNTIP (procalcitonin:4,lacticidven:4) ) Recent Results (from the past 240 hour(s))  SARS Coronavirus 2 (CEPHEID - Performed in [place] hospital lab), Hosp Order     Status: None   Collection Time: 12/22/18  7:18 PM   Specimen: Nasopharyngeal Swab  Result Value Ref Range Status   SARS Coronavirus 2 NEGATIVE NEGATIVE Final    Comment: (NOTE) If result is NEGATIVE SARS-CoV-2 target nucleic acids are NOT DETECTED. The SARS-CoV-2 RNA is generally detectable in upper and lower  respiratory specimens during the acute phase of infection. The lowest  concentration of SARS-CoV-2 viral copies this assay can detect is 250  copies / mL. A negative result does not preclude SARS-CoV-2 infection  and should not be used as the sole basis for treatment or other  patient management decisions.  A negative result may occur with  improper specimen collection / handling, submission of specimen other  than nasopharyngeal swab, presence of viral mutation(s) within the  areas targeted by this assay, and inadequate number of viral copies  (<250 copies / mL). A  negative result must be combined with clinical  observations, patient history, and epidemiological information. If result is POSITIVE SARS-CoV-2 target nucleic acids are DETECTED. The SARS-CoV-2 RNA is generally detectable in upper and lower  respiratory specimens dur ing the acute phase of infection.  Positive  results are indicative of active infection with SARS-CoV-2.  Clinical  correlation with patient history and other diagnostic information is  necessary to determine patient infection  status.  Positive results do  not rule out bacterial infection or co-infection with other viruses. If result is PRESUMPTIVE POSTIVE SARS-CoV-2 nucleic acids MAY BE PRESENT.   A presumptive positive result was obtained on the submitted specimen  and confirmed on repeat testing.  While 2019 novel coronavirus  (SARS-CoV-2) nucleic acids may be present in the submitted sample  additional confirmatory testing may be necessary for epidemiological  and / or clinical management purposes  to differentiate between  SARS-CoV-2 and other Sarbecovirus currently known to infect humans.  If clinically indicated additional testing with an alternate test  methodology [phone]) is advised. The SARS-CoV-2 RNA is generally  detectable in upper and lower respiratory sp ecimens during the acute  phase of infection. The expected result is Negative. Fact Sheet for Patients:  StrictlyIdeas.no Fact Sheet for Healthcare Providers: BankingDealers.co.za This test is not yet approved or cleared by the [place] FDA and has been authorized for detection and/or diagnosis of SARS-CoV-2 by FDA under an Emergency Use Authorization (EUA).  This EUA will remain in effect (meaning this test can be used) for the duration of the COVID-19 declaration under Section 564(b)(1) of the Act, 21 U.S.C. section 360bbb-3(b)(1), unless the authorization is terminated or revoked sooner. Performed  at [facility], [place] [address] [place], [place] [address]      Radiological Exams on Admission: Dg Chest 2 View  Result Date: 12/22/2018 CLINICAL DATA:  Shortness of Breath, cough EXAM: CHEST - 2 VIEW COMPARISON:  05/30/2018 FINDINGS: Severe chronic lung disease with bronchiectasis and fibrosis. Increasing opacity in the left upper lobe, cannot exclude left upper lobe infiltrate/pneumonia. Hyperinflation. Heart is borderline in size. Small left effusion. No acute bony abnormality. IMPRESSION: Severe chronic lung disease with bronchiectasis and fibrosis.  COPD. Borderline heart size. Increasing opacity within the left upper lobe. While this could reflect worsening scarring, cannot exclude acute infiltrate/pneumonia. Small left effusion. Electronically Signed   By: Rolm Baptise M.D.   On: 12/22/2018 20:05   Ct Chest Wo Contrast  Result Date: 12/22/2018 CLINICAL DATA:  85 y/o F; Bronchiectasis, acute exacerbation Shortness of breath. EXAM: CT CHEST WITHOUT CONTRAST TECHNIQUE: Multidetector CT imaging of the chest was performed following the standard protocol without IV contrast. COMPARISON:  07/17/2017 CT chest. FINDINGS: Cardiovascular: Normal caliber thoracic aorta and main pulmonary artery. Aortic calcific atherosclerosis. Normal heart size. No pericardial effusion. Mediastinum/Nodes: No enlarged mediastinal or axillary lymph nodes. Thyroid gland, trachea, and esophagus demonstrate no significant findings. Lungs/Pleura: Stable severe cylindrical and varicoid bronchiectasis throughout the lungs. Interval increased diffuse peribronchial wall thickening. Interval development of multiloculated thick-walled pulmonary cavities within the left upper lobe, the largest measuring 34 x 40 x 39 mm (AP x ML x CC series 6, image 49 and series 5, image 28). Partial resolution of ground-glass opacities and patchy consolidations elsewhere throughout the lung. Stable findings of postinflammatory  scarring greatest in the lung bases. Upper Abdomen: No acute abnormality. Musculoskeletal: Cachexia. No acute osseous abnormality identified. IMPRESSION: 1. Interval development of multiloculated thick-walled pulmonary cavities within the left upper lobe, measuring up to 4 cm. Findings probably represent cavitary pneumonia. Underlying malignancy cannot be excluded. 2. Partial resolution of ground-glass opacities and patchy consolidations elsewhere throughout the lungs. 3. Stable severe cylindrical and varicoid bronchiectasis throughout the lungs. 4. Interval increased diffuse peribronchial thickening which may represent acute on chronic bronchitis. Electronically Signed   By: Kristine Garbe M.D.   On: 12/22/2018 21:20    EKG: Independently reviewed.  It showed  normal sinus rhythm with left bundle branch block.  EKG is unchanged from previous.  Assessment/Plan Principal Problem:   Cavitary pneumonia Active Problems:   BRONCHIECTASIS   GERD (gastroesophageal reflux disease)   Acute on chronic respiratory failure with hypoxia (HCC)   Hyponatremia   Essential hypertension   COPD with acute exacerbation (HCC)   Depression with anxiety   Hypothyroidism     #1 cavitary pneumonia: Patient will be admitted for evaluation.  Differentials include lung abscess, aspiration pneumonia with complex anaerobic and gram-negative bacterial infection, nocardia and Mycobacterium which are less likely with no history or clinical presentation to support that.  Discussed case with infectious disease from the ER who recommended Levaquin as a start and treat for bacterial pneumonia.  We will admit the patient and continue.  Obtain cultures of sputum and blood and monitor.  Consult have pulmonary specialist Dr. Annamaria Boots in the morning  #2 bronchiectasis: Chronic.  Continue antibiotics.  May consider adding steroids in addition to inhalers.  #3 COPD: Mild exacerbation with some mild expiratory wheezing.  Continue  treatment as above.  #4 GERD: Continue with PPIs.  #5 hyponatremia: Probably dehydration.  Hydrate and monitor patient  #6 essential hypertension: Continue blood pressure control  #7 hypothyroidism: Continue levothyroxine.  #8 depression with anxiety: Patient pleasant with no evidence of mood swings.  Continue home medication   DVT prophylaxis: Lovenox Code Status: Full code Family Communication: Discussed care with patient directly Disposition Plan: To be determined Consults called: None.  Consult Dr. Annamaria Boots in the morning Admission status: Inpatient  Severity of Illness: The appropriate patient status for this patient is INPATIENT. Inpatient status is judged to be reasonable and necessary in order to provide the required intensity of service to ensure the patient's safety. The patient's presenting symptoms, physical exam findings, and initial radiographic and laboratory data in the context of their chronic comorbidities is felt to place them at high risk for further clinical deterioration. Furthermore, it is not anticipated that the patient will be medically stable for discharge from the hospital within 2 midnights of admission. The following factors support the patient status of inpatient.   " The patient's presenting symptoms include shortness of breath and cough. " The worrisome physical exam findings include decreased air entry with coarse breath sounds. " The initial radiographic and laboratory data are worrisome because of CT findings of cavitary pneumonia. " The chronic co-morbidities include bronchiectasis.   * I certify that at the point of admission it is my clinical judgment that the patient will require inpatient hospital care spanning beyond 2 midnights from the point of admission due to high intensity of service, high risk for further deterioration and high frequency of surveillance required.Barbette Merino MD [facility] Pager [phone]  If 7PM-7AM,  please contact night-coverage [email or web address] Password [facility]  12/22/2018, 11:22 PM

## 2018-12-22 NOTE — ED Notes (Signed)
Bed: KQ20 Expected date:  Expected time:  Means of arrival:  Comments: EMS 88y F SOB

## 2018-12-23 DIAGNOSIS — E039 Hypothyroidism, unspecified: Secondary | ICD-10-CM

## 2018-12-23 DIAGNOSIS — E871 Hypo-osmolality and hyponatremia: Secondary | ICD-10-CM

## 2018-12-23 DIAGNOSIS — I10 Essential (primary) hypertension: Secondary | ICD-10-CM

## 2018-12-23 DIAGNOSIS — L8990 Pressure ulcer of unspecified site, unspecified stage: Secondary | ICD-10-CM | POA: Diagnosis present

## 2018-12-23 DIAGNOSIS — F418 Other specified anxiety disorders: Secondary | ICD-10-CM

## 2018-12-23 DIAGNOSIS — J449 Chronic obstructive pulmonary disease, unspecified: Secondary | ICD-10-CM | POA: Diagnosis present

## 2018-12-23 DIAGNOSIS — J984 Other disorders of lung: Secondary | ICD-10-CM

## 2018-12-23 DIAGNOSIS — J471 Bronchiectasis with (acute) exacerbation: Secondary | ICD-10-CM

## 2018-12-23 DIAGNOSIS — J9621 Acute and chronic respiratory failure with hypoxia: Secondary | ICD-10-CM

## 2018-12-23 DIAGNOSIS — J189 Pneumonia, unspecified organism: Secondary | ICD-10-CM

## 2018-12-23 LAB — BLOOD CULTURE ID PANEL (REFLEXED)

## 2018-12-23 LAB — EXPECTORATED SPUTUM ASSESSMENT W GRAM STAIN, RFLX TO RESP C

## 2018-12-23 LAB — COMPREHENSIVE METABOLIC PANEL
ALT: 14 U/L (ref 0–44)
AST: 17 U/L (ref 15–41)
Albumin: 2.6 g/dL — ABNORMAL LOW (ref 3.5–5.0)
Alkaline Phosphatase: 94 U/L (ref 38–126)
Anion gap: 8 (ref 5–15)
BUN: 10 mg/dL (ref 8–23)
CO2: 36 mmol/L — ABNORMAL HIGH (ref 22–32)
Calcium: 8.1 mg/dL — ABNORMAL LOW (ref 8.9–10.3)
Chloride: 92 mmol/L — ABNORMAL LOW (ref 98–111)
Creatinine, Ser: 0.42 mg/dL — ABNORMAL LOW (ref 0.44–1.00)
GFR calc Af Amer: >60 mL/min (ref >60)
GFR calc non Af Amer: >60 mL/min (ref >60)
Glucose, Bld: 120 mg/dL — ABNORMAL HIGH (ref 70–99)
Potassium: 2.8 mmol/L — ABNORMAL LOW (ref 3.5–5.1)
Sodium: 136 mmol/L (ref 135–145)
Total Bilirubin: 0.3 mg/dL (ref 0.3–1.2)
Total Protein: 6.9 g/dL (ref 6.5–8.1)

## 2018-12-23 LAB — HIV ANTIBODY (ROUTINE TESTING W REFLEX): HIV Screen 4th Generation wRfx: NONREACTIVE

## 2018-12-23 LAB — MAGNESIUM: Magnesium: 1.5 mg/dL — ABNORMAL LOW (ref 1.7–2.4)

## 2018-12-23 MED ORDER — LEVOFLOXACIN IN D5W 750 MG/150ML IV SOLN: 750.0000 mg | INTRAVENOUS | Status: DC

## 2018-12-23 MED ORDER — ALPRAZOLAM 0.5 MG PO TABS
1.0000 mg | ORAL_TABLET | Freq: Every day | ORAL | Status: DC
  Administered 2018-12-23 – 2018-12-28 (×6): 1 mg via ORAL
  Filled 2018-12-23 (×7): qty 2

## 2018-12-23 MED ORDER — AMLODIPINE BESYLATE 5 MG PO TABS
5.0000 mg | ORAL_TABLET | Freq: Every evening | ORAL | Status: DC
  Administered 2018-12-23 – 2018-12-27 (×5): 5 mg via ORAL
  Filled 2018-12-23 (×5): qty 1

## 2018-12-23 MED ORDER — ALPRAZOLAM 0.5 MG PO TABS
0.5000 mg | ORAL_TABLET | Freq: Every evening | ORAL | Status: DC | PRN
  Administered 2018-12-23 – 2018-12-26 (×4): 1 mg via ORAL
  Filled 2018-12-23 (×4): qty 2

## 2018-12-23 MED ORDER — GUAIFENESIN 100 MG/5ML PO SOLN
5.0000 mL | ORAL | Status: DC | PRN
  Administered 2018-12-23 – 2018-12-27 (×5): 100 mg via ORAL
  Filled 2018-12-23 (×5): qty 10

## 2018-12-23 MED ORDER — LEVOTHYROXINE SODIUM 88 MCG PO TABS
88.0000 ug | ORAL_TABLET | Freq: Every day | ORAL | Status: DC
  Administered 2018-12-23 – 2018-12-28 (×6): 88 ug via ORAL
  Filled 2018-12-23 (×6): qty 1

## 2018-12-23 MED ORDER — POTASSIUM CHLORIDE CRYS ER 20 MEQ PO TBCR
40.0000 meq | EXTENDED_RELEASE_TABLET | ORAL | Status: AC
  Administered 2018-12-23 (×2): 40 meq via ORAL
  Filled 2018-12-23 (×2): qty 2

## 2018-12-23 MED ORDER — LORATADINE 10 MG PO TABS
10.0000 mg | ORAL_TABLET | Freq: Every day | ORAL | Status: DC
  Administered 2018-12-23 – 2018-12-28 (×6): 10 mg via ORAL
  Filled 2018-12-23 (×6): qty 1

## 2018-12-23 MED ORDER — MAGNESIUM SULFATE 4 GM/100ML IV SOLN
4.0000 g | Freq: Once | INTRAVENOUS | Status: AC
  Administered 2018-12-23: 4 g via INTRAVENOUS
  Filled 2018-12-23: qty 100

## 2018-12-23 MED ORDER — ENOXAPARIN SODIUM 30 MG/0.3ML ~~LOC~~ SOLN
30.0000 mg | Freq: Every day | SUBCUTANEOUS | Status: DC
  Administered 2018-12-23 – 2018-12-24 (×2): 30 mg via SUBCUTANEOUS
  Filled 2018-12-23 (×2): qty 0.3

## 2018-12-23 MED ORDER — PANTOPRAZOLE SODIUM 40 MG PO TBEC
40.0000 mg | DELAYED_RELEASE_TABLET | Freq: Every day | ORAL | Status: DC
  Administered 2018-12-23 – 2018-12-25 (×3): 40 mg via ORAL
  Filled 2018-12-23 (×3): qty 1

## 2018-12-23 MED ORDER — IPRATROPIUM-ALBUTEROL 0.5-2.5 (3) MG/3ML IN SOLN
3.0000 mL | Freq: Three times a day (TID) | RESPIRATORY_TRACT | Status: DC
  Administered 2018-12-23 – 2018-12-28 (×14): 3 mL via RESPIRATORY_TRACT
  Filled 2018-12-23 (×15): qty 3

## 2018-12-23 MED ORDER — SERTRALINE HCL 50 MG PO TABS
50.0000 mg | ORAL_TABLET | Freq: Every day | ORAL | Status: DC
  Administered 2018-12-23 – 2018-12-28 (×6): 50 mg via ORAL
  Filled 2018-12-23 (×6): qty 1

## 2018-12-23 MED ORDER — SODIUM CHLORIDE 0.9 % IV SOLN
INTRAVENOUS | Status: DC
  Administered 2018-12-23 – 2018-12-24 (×3): via INTRAVENOUS

## 2018-12-23 MED ORDER — BUDESONIDE 0.5 MG/2ML IN SUSP
0.5000 mg | Freq: Two times a day (BID) | RESPIRATORY_TRACT | Status: DC
  Administered 2018-12-23 – 2018-12-28 (×11): 0.5 mg via RESPIRATORY_TRACT
  Filled 2018-12-23 (×11): qty 2

## 2018-12-23 MED ORDER — ADULT MULTIVITAMIN W/MINERALS CH
1.0000 | ORAL_TABLET | ORAL | Status: DC
  Administered 2018-12-23 – 2018-12-27 (×3): 1 via ORAL
  Filled 2018-12-23 (×6): qty 1

## 2018-12-23 MED ORDER — FLUTICASONE PROPIONATE 50 MCG/ACT NA SUSP
2.0000 | Freq: Every day | NASAL | Status: DC
  Administered 2018-12-23 – 2018-12-28 (×6): 2 via NASAL
  Filled 2018-12-23: qty 16

## 2018-12-23 MED ORDER — IPRATROPIUM-ALBUTEROL 0.5-2.5 (3) MG/3ML IN SOLN
3.0000 mL | Freq: Four times a day (QID) | RESPIRATORY_TRACT | Status: DC
  Administered 2018-12-23: 11:00:00 3 mL via RESPIRATORY_TRACT
  Filled 2018-12-23: qty 3

## 2018-12-23 MED ORDER — CELECOXIB 200 MG PO CAPS
200.0000 mg | ORAL_CAPSULE | Freq: Every day | ORAL | Status: DC
  Administered 2018-12-23 – 2018-12-28 (×6): 200 mg via ORAL
  Filled 2018-12-23 (×6): qty 1

## 2018-12-23 MED ORDER — AMITRIPTYLINE HCL 25 MG PO TABS
50.0000 mg | ORAL_TABLET | Freq: Every day | ORAL | Status: DC
  Administered 2018-12-23 – 2018-12-27 (×5): 50 mg via ORAL
  Filled 2018-12-23 (×5): qty 2

## 2018-12-23 NOTE — Progress Notes (Addendum)
PROGRESS NOTE    [name]  VQQ:[id number] DOB: [birth year] DOA: 12/22/2018 PCP: Lujean Amel, MD    Brief Narrative:  HPI per Dr. Rodolph Bong is a 85 y.o. female with medical history significant of bronchiectasis, COPD, recurrent pneumonia, hypertension, hypothyroidism, Hashimoto's thyroiditis and GERD with vocal cord paralysis who presented to the ER with significant shortness of breath and cough.  She has had recurrent pneumonias in the past so patient believes she has another pneumonia.  She has been taking her MDIs and other medications at home with no relief.  In the ER she was found to be hypoxic cachectic woman who is struggling to breathe.  X-rays and CT chest were performed showing cavitary pneumonia which is multiloculated.  Suspected aspiration pneumonitis with abscesses.  Patient has been admitted to the hospital for treatment.  She denied any hemoptysis.  Denied any fever or chills.  Denied any lung injury.  She has chest pain rated as 6 out of 10 occasionally with the coughing..  ED Course: Temperature is 98.4, blood pressure 152/72, pulse 89, respiratory 25, sats 88% on 2 L.  Patient has a white count of 10.5 hemoglobin 10.6 and platelet 178.  Sodium is 133 potassium 3.6 CO2 35 creatinine 1.53 and calcium 8.2.  BNP of 117.  COVID-19 screen is negative.  Chest x-ray showed increasing opacity within the left upper lobe.  Also small left effusion.  CT chest without contrast showed interval development of multiloculated thick-walled pulmonary cavities within the left upper lobe.  This measures about 4 cm.  Findings consistent with cavitary pneumonia and underlying malignancy not excluded.  Also severe bronchiectasis.  Patient is therefore being admitted for further treatment after initiate Levaquin  Assessment & Plan:   Principal Problem:   Cavitary pneumonia Active Problems:   BRONCHIECTASIS   GERD (gastroesophageal reflux disease)   Acute on chronic respiratory  failure with hypoxia (HCC)   Hyponatremia   Essential hypertension   COPD with acute exacerbation (HCC)   Depression with anxiety   Hypothyroidism   Pressure injury of skin  1 acute on chronic respiratory failure with hypoxia/cavitary pneumonia/chronic bronchiectasis Number etiology.  Initial concern for lung abscess versus aspiration pneumonia with complex anaerobic and gram-negative bacterial infection versus nocardia versus Mycobacterium species.  Due to complicated pulmonary history with vocal cord dysfunction and cavitary pneumonia as noted on CT chest pulmonary has been consulted.  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  Pulmonary feels patient's presentation and imaging consistent with aspiration and cavitary pneumonia in the left upper lobe with concerns for possible reactivation of MAC versus aspiration pneumonia.  Patient frail and wanting to avoid procedures per pulmonary and as such bronchoscopy will be avoided at this time.  Sputum Gram stain and cultures pending.  Continue IV Levaquin.  Place on Pulmicort and scheduled duo nebs.  Placed on Flonase, Claritin, PPI.  Aspiration precautions.  Pulmonary following and appreciate the input and recommendations.  2.  Hypokalemia/hypomagnesemia Potassium at 2.8.  Magnesium at 1.5.  Replace potassium.  We will give magnesium 4 g IV x1.  Repeat labs in the morning.   3.  Hypothyroidism Continue home dose Synthroid.  4.  COPD Stable.  Continue Pulmicort, scheduled duo nebs.  Placed on Flonase, Claritin, PPI.  Pulmonary following.  5.  Hypertension Stable.  Continue Norvasc.  6.  Depression/anxiety Resume home regimen Elavil, Zoloft,  Xanax.  Follow.  7.  Gastroesophageal reflux disease PPI.  8.  Hyponatremia Likely secondary to volume depletion.  Improving with gentle hydration.  Follow.  9.  Stage II sacral pressure injury, POA  Continue current wound care.   DVT prophylaxis: Lovenox Code Status: Full Family Communication: Updated patient.  Updated daughter via telephone.   Disposition Plan: To be determined.   Consultants:   PCCM/pulmonary: Dr. Elsworth Soho 12/23/2018  Procedures:   CT chest 12/22/2018  Chest x-ray 12/22/2018  Antimicrobials:   IV Levaquin 12/22/2018   Subjective: Patient sitting up on the side of the bed stating she needs to go to the bathroom.  Patient still with some shortness of breath.  Patient with cough.  Denies any chest pain.  Objective: Vitals:   12/22/18 2145 12/23/18 0026 12/23/18 0507 12/23/18 1105  BP:  (!) 149/70 (!) 145/72   Pulse: 85 93 93   Resp: (!) _0 Temp:  98.1 F (36.7 C) 98.2 F (36.8 C)   TempSrc:  Oral    SpO2: 100% 98% 100% 98%  Weight:      Height:        Intake/Output Summary (Last 24 hours) at 12/23/2018 1234 Last data filed at 12/23/2018 1129 Gross per 24 hour  Intake 1455.33 ml  Output -  Net 1455.33 ml   Filed Weights   12/22/18 1904  Weight: 34.9 kg    Examination:  General exam: Frail. Respiratory system: Coarse diffuse rhonchorous breath sounds.  No wheezing noted.  No crackles.  Speaking in full sentences.  Respiratory effort normal. Cardiovascular system: S1 & S2 heard, RRR. No JVD, murmurs, rubs, gallops or clicks. No pedal edema. Gastrointestinal system: Abdomen is nondistended, soft and nontender. No organomegaly or masses felt. Normal bowel sounds heard. Central nervous system: Alert and oriented. No focal neurological deficits. Extremities: Symmetric 5 x 5 power. Skin: No rashes, lesions or ulcers Psychiatry: Judgement and insight appear normal. Mood & affect appropriate.     Data Reviewed: I have personally reviewed following labs and imaging studies  CBC: Recent Labs  Lab 12/22/18 1918  WBC 10.5  NEUTROABS 7.7  HGB 10.6*  HCT 33.3*  MCV 88.6  PLT 287   Basic Metabolic Panel: Recent Labs  Lab 12/22/18  2015 12/23/18 0402  NA 133* 136  K 3.6 2.8*  CL 90* 92*  CO2 35* 36*  GLUCOSE 139* 120*  BUN 12 10  CREATININE 0.53 0.42*  CALCIUM 8.2* 8.1*  MG  --  1.5*   GFR: Estimated Creatinine Clearance: 27.8 mL/min (A) (by C-G formula based on SCr of 0.42 mg/dL (L)). Liver Function Tests: Recent Labs  Lab 12/23/18 0402  AST 17  ALT 14  ALKPHOS 94  BILITOT 0.3  PROT 6.9  ALBUMIN 2.6*   No results for input(s): LIPASE, AMYLASE in the last 168 hours. No results for input(s): AMMONIA in the last 168 hours. Coagulation Profile: No results for input(s): INR, PROTIME in the last 168 hours. Cardiac Enzymes: Recent Labs  Lab 12/22/18 1918  TROPONINI <0.03   BNP (last 3 results) No results for input(s): PROBNP in the last 8760 hours. HbA1C: No results for input(s): HGBA1C in the last 72 hours. CBG: No results for input(s): GLUCAP in the last 168 hours. Lipid Profile: No results for input(s): CHOL, HDL, LDLCALC, TRIG, CHOLHDL, LDLDIRECT in the last 72 hours. Thyroid Function Tests: No results for input(s): TSH, T4TOTAL, FREET4, T3FREE, THYROIDAB in the last 72 hours. Anemia Panel: No  results for input(s): VITAMINB12, FOLATE, FERRITIN, TIBC, IRON, RETICCTPCT in the last 72 hours. Sepsis Labs: Recent Labs  Lab 12/22/18 1918  LATICACIDVEN 1.1    Recent Results (from the past 240 hour(s))  SARS Coronavirus 2 (CEPHEID - Performed in [place] hospital lab), Hosp Order     Status: None   Collection Time: 12/22/18  7:18 PM   Specimen: Nasopharyngeal Swab  Result Value Ref Range Status   SARS Coronavirus 2 NEGATIVE NEGATIVE Final    Comment: (NOTE) If result is NEGATIVE SARS-CoV-2 target nucleic acids are NOT DETECTED. The SARS-CoV-2 RNA is generally detectable in upper and lower  respiratory specimens during the acute phase of infection. The lowest  concentration of SARS-CoV-2 viral copies this assay can detect is 250  copies / mL. A negative result does not preclude  SARS-CoV-2 infection  and should not be used as the sole basis for treatment or other  patient management decisions.  A negative result may occur with  improper specimen collection / handling, submission of specimen other  than nasopharyngeal swab, presence of viral mutation(s) within the  areas targeted by this assay, and inadequate number of viral copies  (<250 copies / mL). A negative result must be combined with clinical  observations, patient history, and epidemiological information. If result is POSITIVE SARS-CoV-2 target nucleic acids are DETECTED. The SARS-CoV-2 RNA is generally detectable in upper and lower  respiratory specimens dur ing the acute phase of infection.  Positive  results are indicative of active infection with SARS-CoV-2.  Clinical  correlation with patient history and other diagnostic information is  necessary to determine patient infection status.  Positive results do  not rule out bacterial infection or co-infection with other viruses. If result is PRESUMPTIVE POSTIVE SARS-CoV-2 nucleic acids MAY BE PRESENT.   A presumptive positive result was obtained on the submitted specimen  and confirmed on repeat testing.  While 2019 novel coronavirus  (SARS-CoV-2) nucleic acids may be present in the submitted sample  additional confirmatory testing may be necessary for epidemiological  and / or clinical management purposes  to differentiate between  SARS-CoV-2 and other Sarbecovirus currently known to infect humans.  If clinically indicated additional testing with an alternate test  methodology [phone]) is advised. The SARS-CoV-2 RNA is generally  detectable in upper and lower respiratory sp ecimens during the acute  phase of infection. The expected result is Negative. Fact Sheet for Patients:  StrictlyIdeas.no Fact Sheet for Healthcare Providers: BankingDealers.co.za This test is not yet approved or cleared by the  [place] FDA and has been authorized for detection and/or diagnosis of SARS-CoV-2 by FDA under an Emergency Use Authorization (EUA).  This EUA will remain in effect (meaning this test can be used) for the duration of the COVID-19 declaration under Section 564(b)(1) of the Act, 21 U.S.C. section 360bbb-3(b)(1), unless the authorization is terminated or revoked sooner. Performed at [facility] [place] [address], [place], [place] [address]   Culture, sputum-assessment     Status: None   Collection Time: 12/23/18 12:51 AM   Specimen: Expectorated Sputum  Result Value Ref Range Status   Specimen Description EXPECTORATED SPUTUM  Final   Special Requests NONE  Final   Sputum evaluation   Final    THIS SPECIMEN IS ACCEPTABLE FOR SPUTUM CULTURE Performed at [facility], [place] [address] [place], [place] [address]    Report Status 12/23/2018 FINAL  Final  Culture, respiratory     Status: None (Preliminary result)  Collection Time: 12/23/18 12:51 AM  Result Value Ref Range Status   Specimen Description   Final    EXPECTORATED SPUTUM Performed at [facility] [address] [place], [place] [address]    Special Requests   Final    NONE Reflexed from [id number] Performed at [facility] [address], [place], [place] [address]    Gram Stain   Final    MODERATE WBC PRESENT,BOTH PMN AND MONONUCLEAR FEW GRAM POSITIVE COCCI IN PAIRS RARE GRAM POSITIVE RODS Performed at [facility], [place] [address], [place], [place] [address]    Culture PENDING  Incomplete   Report Status PENDING  Incomplete         Radiology Studies: Dg Chest 2 View  Result Date: 12/22/2018 CLINICAL DATA:  Shortness of Breath, cough EXAM: CHEST - 2 VIEW COMPARISON:  05/30/2018 FINDINGS: Severe chronic lung disease with bronchiectasis and fibrosis. Increasing opacity in the left upper lobe, cannot exclude left upper lobe  infiltrate/pneumonia. Hyperinflation. Heart is borderline in size. Small left effusion. No acute bony abnormality. IMPRESSION: Severe chronic lung disease with bronchiectasis and fibrosis.  COPD. Borderline heart size. Increasing opacity within the left upper lobe. While this could reflect worsening scarring, cannot exclude acute infiltrate/pneumonia. Small left effusion. Electronically Signed   By: Rolm Baptise M.D.   On: 12/22/2018 20:05   Ct Chest Wo Contrast  Result Date: 12/22/2018 CLINICAL DATA:  85 y/o F; Bronchiectasis, acute exacerbation Shortness of breath. EXAM: CT CHEST WITHOUT CONTRAST TECHNIQUE: Multidetector CT imaging of the chest was performed following the standard protocol without IV contrast. COMPARISON:  07/17/2017 CT chest. FINDINGS: Cardiovascular: Normal caliber thoracic aorta and main pulmonary artery. Aortic calcific atherosclerosis. Normal heart size. No pericardial effusion. Mediastinum/Nodes: No enlarged mediastinal or axillary lymph nodes. Thyroid gland, trachea, and esophagus demonstrate no significant findings. Lungs/Pleura: Stable severe cylindrical and varicoid bronchiectasis throughout the lungs. Interval increased diffuse peribronchial wall thickening. Interval development of multiloculated thick-walled pulmonary cavities within the left upper lobe, the largest measuring 34 x 40 x 39 mm (AP x ML x CC series 6, image 49 and series 5, image 28). Partial resolution of ground-glass opacities and patchy consolidations elsewhere throughout the lung. Stable findings of postinflammatory scarring greatest in the lung bases. Upper Abdomen: No acute abnormality. Musculoskeletal: Cachexia. No acute osseous abnormality identified. IMPRESSION: 1. Interval development of multiloculated thick-walled pulmonary cavities within the left upper lobe, measuring up to 4 cm. Findings probably represent cavitary pneumonia. Underlying malignancy cannot be excluded. 2. Partial resolution of ground-glass  opacities and patchy consolidations elsewhere throughout the lungs. 3. Stable severe cylindrical and varicoid bronchiectasis throughout the lungs. 4. Interval increased diffuse peribronchial thickening which may represent acute on chronic bronchitis. Electronically Signed   By: Kristine Garbe M.D.   On: 12/22/2018 21:20        Scheduled Meds: . ALPRAZolam  1 mg Oral Daily  . amitriptyline  50 mg Oral QHS  . amLODipine  5 mg Oral QPM  . budesonide (PULMICORT) nebulizer solution  0.5 mg Nebulization BID  . celecoxib  200 mg Oral QPC breakfast  . enoxaparin (LOVENOX) injection  30 mg Subcutaneous Daily  . fluticasone  2 spray Each Nare Daily  . ipratropium-albuterol  3 mL Nebulization TID  . levothyroxine  88 mcg Oral QAC breakfast  . loratadine  10 mg Oral Daily  . multivitamin with minerals  1 tablet Oral Q M,W,F  . pantoprazole  40 mg Oral Daily  .  potassium chloride  40 mEq Oral Q4H  . sertraline  50 mg Oral Q1200   Continuous Infusions: . sodium chloride 75 mL/hr at 12/23/18 1129  . [START ON 12/24/2018] levofloxacin (LEVAQUIN) IV    . magnesium sulfate bolus IVPB       LOS: 1 day    Time spent: 35 minutes    Irine Seal, MD [facility]  If 7PM-7AM, please contact night-coverage [email or web address] 12/23/2018, 12:34 PM

## 2018-12-23 NOTE — Progress Notes (Signed)
PHARMACY - PHYSICIAN COMMUNICATION CRITICAL VALUE ALERT - BLOOD CULTURE IDENTIFICATION (BCID)  [name] is an 85 y.o. female who presented to [facility] on 12/22/2018 with a chief complaint of SOB  Assessment:  Cavitary PNA  Name of physician (or Provider) Contacted: Tylene Fantasia via AMION  Current antibiotics: levaquin  Changes to prescribed antibiotics recommended:  Patient is on recommended antibiotics - No changes needed  Results for orders placed or performed during the hospital encounter of 12/22/18  Blood Culture ID Panel (Reflexed) (Collected: 12/22/2018 11:46 PM)  Result Value Ref Range   Enterococcus species NOT DETECTED NOT DETECTED   Listeria monocytogenes NOT DETECTED NOT DETECTED   Staphylococcus species NOT DETECTED NOT DETECTED   Staphylococcus aureus (BCID) NOT DETECTED NOT DETECTED   Streptococcus species DETECTED (A) NOT DETECTED   Streptococcus agalactiae NOT DETECTED NOT DETECTED   Streptococcus pneumoniae NOT DETECTED NOT DETECTED   Streptococcus pyogenes NOT DETECTED NOT DETECTED   Acinetobacter baumannii NOT DETECTED NOT DETECTED   Enterobacteriaceae species NOT DETECTED NOT DETECTED   Enterobacter cloacae complex NOT DETECTED NOT DETECTED   Escherichia coli NOT DETECTED NOT DETECTED   Klebsiella oxytoca NOT DETECTED NOT DETECTED   Klebsiella pneumoniae NOT DETECTED NOT DETECTED   Proteus species NOT DETECTED NOT DETECTED   Serratia marcescens NOT DETECTED NOT DETECTED   Haemophilus influenzae NOT DETECTED NOT DETECTED   Neisseria meningitidis NOT DETECTED NOT DETECTED   Pseudomonas aeruginosa NOT DETECTED NOT DETECTED   Candida albicans NOT DETECTED NOT DETECTED   Candida glabrata NOT DETECTED NOT DETECTED   Candida krusei NOT DETECTED NOT DETECTED   Candida parapsilosis NOT DETECTED NOT DETECTED   Candida tropicalis NOT DETECTED NOT DETECTED   Eudelia Bunch, Pharm.D 12/23/2018 10:52 PM

## 2018-12-23 NOTE — Consult Note (Signed)
NAME:  [name], MRN:  [id number], DOB:  March 09, 1931, LOS: 1 ADMISSION DATE:  12/22/2018, CONSULTATION DATE:   REFERRING MD:  Grandville Silos , CHIEF COMPLAINT:  Cavitary PNA   Brief History   85 year old female w/ known h/o BTX, vocal cord paresis, aspiration and remote MAC admitted w/ worsening SOB and hypoxia on 6/14 and found to have new cavitary lesions in LUL.   History of present illness   This is a chronically ill appearing 85 year old female f/b dr Annamaria Boots w/ history as mentioned below. As noted has sig h/o severe bronchiectasis felt to be 2/2 chronic aspiration per Dr Janee Morn note but also she has been diagnosed and treated for [facility] in the past. She has chronic cough. Presents to ED on 6/14 w/ about 1 week h/o worsening cough. She reports the onset in change in intensity after "choking on some food", since that point the cough has been persistent, productive of dark grey colored sputum and associated w/ worsening shortness of breath. She denies fever, sick exposure, nasal congestion, sore throat, travel, swelling in LEs. She still has sig reflux and intermittent trouble with swallowing. In ER was found to be hypoxic and CT chest was obtained showing multi-loculated cavitary lesions specifically in the left upper lobe. PCCM asked to see re: the CT findings.   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 Interval development of multiloculated thick-walled pulmonary cavities within the left upper lobe, measuring up to 4 cm. Findings probably represent cavitary pneumonia. Underlying malignancy cannot be excluded. 2. Partial resolution of ground-glass opacities and patchy consolidations elsewhere throughout the  lungs. 3. Stable severe cylindrical and varicoid bronchiectasis throughout the lungs. 4. Interval increased diffuse peribronchial thickening which may represent acute on chronic bronchitis.  Micro Data:  6/15 expectorated sputum: mod wbc, few GPC pairs, rare GPR 6/14 BCX2>>> 6/14 COVID -19: neg 6/15: induced sputum and afb>>>  Antimicrobials:  levaquin 6/14>>>  Interim history/subjective:  Feels a little better   Objective   Blood pressure (Abnormal) 145/72, pulse 93, temperature 98.2 F (36.8 C), resp. rate 18, height _0  (1.6 m), weight 34.9 kg, SpO2 98 %.        Intake/Output Summary (Last 24 hours) at 12/23/2018 1131 Last data filed at 12/23/2018 1129 Gross per 24 hour  Intake 1455.33 ml  Output no documentation  Net 1455.33 ml   Filed Weights   12/22/18 1904  Weight: 34.9 kg    Examination: General: frail cachectic 85 year old wf. Resting in bed. No distress  HENT: NCAT does have temporal wasting  Lungs: diffuse rhonchi  Cardiovascular: RRR Abdomen: soft not tender  Extremities: no edema  Neuro: a and o no focal def GU: voids   Resolved Hospital Problem list     Assessment & Plan:   Acute on chronic resp failure Bronchiectasis Chronic aspiration H/o MAIC (treated?) Vocal cord paresis  COPD Severe GERD Anorexia  discussion Acute on chronic hypoxic respiratory failure in setting of recurrent aspiration PNA and what is likely cavitary PNA but could also represent MAIC (aspiration seems most likely given HPI). She is very frail. Does not want procedures and not sure that she would tolerate a FOB. She  could have both MAI and chronic aspiration. Seems like the aspiration component the one we could more readily treat Plan/rec   Induced sputum Cont abx; will need at least 14d course  Ck afb Aspiration and reflux precautions No bronch   Labs   CBC: Recent Labs  Lab 12/22/18 1918  WBC 10.5  NEUTROABS 7.7  HGB 10.6*  HCT 33.3*  MCV 88.6  PLT 178     Basic Metabolic Panel: Recent Labs  Lab 12/22/18 2015 12/23/18 0402  NA 133* 136  K 3.6 2.8*  CL 90* 92*  CO2 35* 36*  GLUCOSE 139* 120*  BUN 12 10  CREATININE 0.53 0.42*  CALCIUM 8.2* 8.1*  MG  --  1.5*   GFR: Estimated Creatinine Clearance: 27.8 mL/min (A) (by C-G formula based on SCr of 0.42 mg/dL (L)). Recent Labs  Lab 12/22/18 1918  WBC 10.5  LATICACIDVEN 1.1    Liver Function Tests: Recent Labs  Lab 12/23/18 0402  AST 17  ALT 14  ALKPHOS 94  BILITOT 0.3  PROT 6.9  ALBUMIN 2.6*   No results for input(s): LIPASE, AMYLASE in the last 168 hours. No results for input(s): AMMONIA in the last 168 hours.  ABG    Component Value Date/Time   PHART 7.356 06/24/2017 1125   PCO2ART 49.7 (H) 06/24/2017 1125   PO2ART 88.1 06/24/2017 1125   HCO3 27.1 06/24/2017 1125   TCO2 25 06/23/2017 0047   O2SAT 96.3 06/24/2017 1125     Coagulation Profile: No results for input(s): INR, PROTIME in the last 168 hours.  Cardiac Enzymes: Recent Labs  Lab 12/22/18 1918  TROPONINI <0.03    HbA1C: Hgb A1c MFr Bld  Date/Time Value Ref Range Status  06/23/2017 05:28 AM 6.0 (H) 4.8 - 5.6 % Final    Comment:    (NOTE)         Prediabetes: 5.7 - 6.4         Diabetes: >6.4         Glycemic control for adults with diabetes: <7.0     CBG: No results for input(s): GLUCAP in the last 168 hours.  Review of Systems:   Review of Systems  Constitutional: Positive for malaise/fatigue and weight loss. Negative for chills, diaphoresis and fever.  HENT: Negative.   Eyes: Negative.   Respiratory: Positive for cough, sputum production and shortness of breath.   Cardiovascular: Negative for chest pain and leg swelling.  Gastrointestinal: Positive for heartburn.  Genitourinary: Negative.   Musculoskeletal: Negative.   Skin: Negative.   Neurological: Negative.   Endo/Heme/Allergies: Negative.   Psychiatric/Behavioral: Negative.      Past Medical History  She,  has a past  medical history of Bronchiectasis, COPD (chronic obstructive pulmonary disease) ([place]), Depression with anxiety, Essential hypertension, GERD (gastroesophageal reflux disease), Hashimoto's thyroiditis, Hypothyroidism, and Vocal cord paralysis.   Surgical History    Past Surgical History:  Procedure Laterality Date   APPENDECTOMY     HEMORRHOID SURGERY     hysteroscopy x 2     THYROIDECTOMY     TUBAL LIGATION       Social History   reports that she has never smoked. She has never used smokeless tobacco. She reports previous alcohol use. She reports that she does not use drugs.   Family History   Her family history includes COPD in her father; Hypertension in her father.   Allergies Allergies  Allergen Reactions   Ambien [Zolpidem]     Foggy mind-gets up  and walks around (pt does not remember anything of the event)   Asa [Aspirin] Other (See Comments)    Spits up blood   Boniva [Ibandronic Acid] Other (See Comments)    GI upset   Cystex [Germanium] Nausea Only   Doxycycline Nausea Only and Hypertension     Home Medications  Prior to Admission medications   Medication Sig Start Date End Date Taking? Authorizing Provider  Albuterol Sulfate (PROAIR RESPICLICK) 025 (90 Base) MCG/ACT AEPB Inhale 2 puffs into the lungs every 6 (six) hours as needed. Patient taking differently: Inhale 2 puffs into the lungs every 6 (six) hours as needed (SOB, wheezing).  08/02/18  Yes Young, Tarri Fuller D, MD  ALPRAZolam Duanne Moron) 0.5 MG tablet Take 0.5-1 mg by mouth See admin instructions. 1  mg every morning, 0.5-1 mg every night as needed for anxiety/sleep   Yes [provider]  amitriptyline (ELAVIL) 50 MG tablet Take 50 mg by mouth at bedtime.    Yes [provider]  amLODipine (NORVASC) 5 MG tablet Take 5 mg by mouth every evening.    Yes [provider]  Azelastine-Fluticasone (DYMISTA) 137-50 MCG/ACT SUSP 1-2 puffs each nostril daily if needed Patient taking  differently: Place 1-2 puffs into both nostrils daily as needed (congestion).  02/14/18  Yes Young, Tarri Fuller D, MD  beclomethasone (QVAR REDIHALER) 40 MCG/ACT inhaler USE 2 INHALATIONS TWICE A DAY Patient taking differently: Inhale 2 puffs into the lungs 2 (two) times daily.  08/02/18  Yes Young, Tarri Fuller D, MD  celecoxib (CELEBREX) 200 MG capsule TAKE 1 CAPSULE TWICE A DAY Patient taking differently: Take 200 mg by mouth daily after breakfast.  11/16/18  Yes Marybelle Killings, MD  cholecalciferol (VITAMIN D) 1000 UNITS tablet Take 1,000 Units by mouth daily.   Yes [provider]  INCRUSE ELLIPTA 62.5 MCG/INH AEPB TAKE 1 PUFF BY MOUTH EVERY DAY Patient taking differently: Inhale 1 puff into the lungs daily.  07/29/18  Yes Young, Tarri Fuller D, MD  irbesartan (AVAPRO) 300 MG tablet Take 300 mg by mouth daily after breakfast.  05/15/17  Yes [provider]  levothyroxine (SYNTHROID, LEVOTHROID) 88 MCG tablet Take 88 mcg by mouth daily before breakfast.    Yes [provider]  Multiple Vitamin (MULTIVITAMIN) tablet Take 1 tablet by mouth every Monday, Wednesday, and Friday.    Yes [provider]  omeprazole (PRILOSEC) 20 MG capsule TAKE 1 CAPSULE DAILY Patient taking differently: Take 20 mg by mouth daily.  04/02/18  Yes Young, Tarri Fuller D, MD  sertraline (ZOLOFT) 50 MG tablet Take 50 mg by mouth daily at 12 noon.    Yes [provider]  Spacer/Aero-Holding Chambers (AEROCHAMBER MV) inhaler Use as instructed 04/02/13  Yes Deneise Lever, MD     Critical care time: na      Erick Colace ACNP-BC [place] Pager # [phone] OR # [phone] if no answer

## 2018-12-23 NOTE — Progress Notes (Addendum)
PHARMACY NOTE:  ANTIMICROBIAL/ANTICOAGULATION RENAL DOSAGE ADJUSTMENT  Current antimicrobial regimen includes a mismatch between antimicrobial dosage and estimated renal function.  As per policy approved by the Pharmacy & Therapeutics and Medical Executive Committees, the antimicrobial dosage will be adjusted accordingly.  Current antimicrobial dosage:  Levaquin 750 mg IV q24h, Lovenox 40 mg daily  Indication: PNA  Renal Function:  Estimated Creatinine Clearance: 27.8 mL/min (by C-G formula based on SCr of 0.53 mg/dL). []      On intermittent HD, scheduled: []      On CRRT    Antimicrobial and Anticoagulant dosage has been changed to:  Levaquin 750 mg IV q48h and Lovenox has been adjusted to 30 mg daily     Thank you for allowing pharmacy to be a part of this patient's care.  Dorrene German, [facility] 12/23/2018 12:32 AM

## 2018-12-24 ENCOUNTER — Inpatient Hospital Stay (HOSPITAL_COMMUNITY): Payer: Medicare Other

## 2018-12-24 DIAGNOSIS — I700 Atherosclerosis of aorta: Secondary | ICD-10-CM | POA: Diagnosis present

## 2018-12-24 DIAGNOSIS — Z888 Allergy status to other drugs, medicaments and biological substances status: Secondary | ICD-10-CM

## 2018-12-24 DIAGNOSIS — I340 Nonrheumatic mitral (valve) insufficiency: Secondary | ICD-10-CM

## 2018-12-24 DIAGNOSIS — K219 Gastro-esophageal reflux disease without esophagitis: Secondary | ICD-10-CM

## 2018-12-24 DIAGNOSIS — J449 Chronic obstructive pulmonary disease, unspecified: Secondary | ICD-10-CM

## 2018-12-24 DIAGNOSIS — J3800 Paralysis of vocal cords and larynx, unspecified: Secondary | ICD-10-CM

## 2018-12-24 DIAGNOSIS — Z9981 Dependence on supplemental oxygen: Secondary | ICD-10-CM

## 2018-12-24 DIAGNOSIS — Z886 Allergy status to analgesic agent status: Secondary | ICD-10-CM

## 2018-12-24 DIAGNOSIS — B955 Unspecified streptococcus as the cause of diseases classified elsewhere: Secondary | ICD-10-CM

## 2018-12-24 DIAGNOSIS — E43 Unspecified severe protein-calorie malnutrition: Secondary | ICD-10-CM

## 2018-12-24 DIAGNOSIS — D649 Anemia, unspecified: Secondary | ICD-10-CM | POA: Diagnosis present

## 2018-12-24 DIAGNOSIS — Z881 Allergy status to other antibiotic agents status: Secondary | ICD-10-CM

## 2018-12-24 DIAGNOSIS — A310 Pulmonary mycobacterial infection: Secondary | ICD-10-CM | POA: Diagnosis present

## 2018-12-24 DIAGNOSIS — Z8701 Personal history of pneumonia (recurrent): Secondary | ICD-10-CM

## 2018-12-24 DIAGNOSIS — B954 Other streptococcus as the cause of diseases classified elsewhere: Secondary | ICD-10-CM

## 2018-12-24 DIAGNOSIS — J154 Pneumonia due to other streptococci: Secondary | ICD-10-CM

## 2018-12-24 DIAGNOSIS — R7881 Bacteremia: Secondary | ICD-10-CM

## 2018-12-24 LAB — BASIC METABOLIC PANEL
Anion gap: 9 (ref 5–15)
BUN: 10 mg/dL (ref 8–23)
CO2: 32 mmol/L (ref 22–32)
Calcium: 7.9 mg/dL — ABNORMAL LOW (ref 8.9–10.3)
Chloride: 95 mmol/L — ABNORMAL LOW (ref 98–111)
Creatinine, Ser: 0.36 mg/dL — ABNORMAL LOW (ref 0.44–1.00)
GFR calc Af Amer: >60 mL/min (ref >60)
GFR calc non Af Amer: >60 mL/min (ref >60)
Glucose, Bld: 98 mg/dL (ref 70–99)
Potassium: 3.9 mmol/L (ref 3.5–5.1)
Sodium: 136 mmol/L (ref 135–145)

## 2018-12-24 LAB — CBC WITH DIFFERENTIAL/PLATELET
Abs Immature Granulocytes: 0.03 10*3/uL (ref 0.00–0.07)
Basophils Absolute: 0.1 10*3/uL (ref 0.0–0.1)
Basophils Relative: 1 %
Eosinophils Absolute: 0.6 10*3/uL — ABNORMAL HIGH (ref 0.0–0.5)
Eosinophils Relative: 6 %
HCT: 33.8 % — ABNORMAL LOW (ref 36.0–46.0)
Hemoglobin: 10.4 g/dL — ABNORMAL LOW (ref 12.0–15.0)
Immature Granulocytes: 0 %
Lymphocytes Relative: 11 %
Lymphs Abs: 1.1 10*3/uL (ref 0.7–4.0)
MCH: 28 pg (ref 26.0–34.0)
MCHC: 30.8 g/dL (ref 30.0–36.0)
MCV: 90.9 fL (ref 80.0–100.0)
Monocytes Absolute: 0.9 10*3/uL (ref 0.1–1.0)
Monocytes Relative: 10 %
Neutro Abs: 6.7 10*3/uL (ref 1.7–7.7)
Neutrophils Relative %: 72 %
Platelets: 293 10*3/uL (ref 150–400)
RBC: 3.72 MIL/uL — ABNORMAL LOW (ref 3.87–5.11)
RDW: 14.2 % (ref 11.5–15.5)
WBC: 9.4 10*3/uL (ref 4.0–10.5)
nRBC: 0 % (ref 0.0–0.2)

## 2018-12-24 LAB — ECHOCARDIOGRAM COMPLETE
Height: 63 in
Weight: 1232 oz

## 2018-12-24 LAB — STREP PNEUMONIAE URINARY ANTIGEN: Strep Pneumo Urinary Antigen: NEGATIVE

## 2018-12-24 LAB — MAGNESIUM: Magnesium: 2.1 mg/dL (ref 1.7–2.4)

## 2018-12-24 MED ORDER — SODIUM CHLORIDE 0.9 % IV SOLN
3.0000 g | Freq: Two times a day (BID) | INTRAVENOUS | Status: DC
  Administered 2018-12-25 – 2018-12-26 (×4): 3 g via INTRAVENOUS
  Filled 2018-12-24 (×5): qty 3

## 2018-12-24 MED ORDER — ENSURE ENLIVE PO LIQD
237.0000 mL | Freq: Two times a day (BID) | ORAL | Status: DC
  Administered 2018-12-25 – 2018-12-28 (×5): 237 mL via ORAL

## 2018-12-24 MED ORDER — SODIUM CHLORIDE 0.9 % IV SOLN
3.0000 g | Freq: Once | INTRAVENOUS | Status: AC
  Administered 2018-12-24: 20:00:00 3 g via INTRAVENOUS
  Filled 2018-12-24: qty 3

## 2018-12-24 MED ORDER — POLYETHYLENE GLYCOL 3350 17 G PO PACK
17.0000 g | PACK | Freq: Every day | ORAL | Status: DC
  Administered 2018-12-24 – 2018-12-26 (×3): 17 g via ORAL
  Filled 2018-12-24 (×4): qty 1

## 2018-12-24 MED ORDER — SENNOSIDES-DOCUSATE SODIUM 8.6-50 MG PO TABS
1.0000 | ORAL_TABLET | Freq: Two times a day (BID) | ORAL | Status: DC
  Administered 2018-12-24 – 2018-12-28 (×6): 1 via ORAL
  Filled 2018-12-24 (×7): qty 1

## 2018-12-24 NOTE — Progress Notes (Signed)
Modified Barium Swallow Progress Note  Patient Details  Name: [name] MRN: [id number] Date of Birth: 1930/11/26  Today's Date: 12/24/2018  Modified Barium Swallow completed.  Full report located under Chart Review in the Imaging Section.  Brief recommendations include the following:  Clinical Impression  Patient presents with mild pharyngeal - and suspected primary esophageal dysphagia.  She demonstrates decreased tongue base retraction which results in pharyngeal residuals.  In addition, very TRACE aspiration of thin mixed with secretions observed due to decreased laryngeal closure.  Chin tuck posture nor head turn left nor right helpful to prevent laryngeal penetration or residuals.  And chin tuck posture WORSENED airway protection as it allowed spillage of barium into open larynx from pyriform sinuses.  Pt reflexive dry swallows and liquid swallows decrease residuals in pharynx.  However, pt does not consistently sense pharyngeal residuals thus recommend strict aspiration precautions including following solids with liquids.  Pt able to "hock" and expectorate viscous pudding barium from vallecular region.  Trace laryngeal penetration with nectar was squeezed out with the same swallow.  Suspect pt's primary aspiration risk is esophageal however mitigation strategies in place for pharyngeal deficits also.   Swallow Evaluation Recommendations       SLP Diet Recommendations: Thin liquid;Dysphagia 3 (Mech soft) solids(soft foods)   Liquid Administration via: Cup;Straw   Medication Administration: Whole meds with puree(start and follow with liquids)   Supervision: Patient able to self feed   Compensations: Slow rate;Small sips/bites;Follow solids with liquid("hock" and expectorate at completion of po intake)   Postural Changes: Seated upright at 90 degrees;Remain semi-upright after after feeds/meals (Comment)   Oral Care Recommendations: Oral care BID      Luanna Salk, MS [facility]  SLP Acute Rehab Services Pager [phone] Office [phone]   Macario Golds 12/24/2018,4:04 PM

## 2018-12-24 NOTE — Evaluation (Signed)
Clinical/Bedside Swallow Evaluation Patient Details  Name: [name] MRN: [id number] Date of Birth: 28-Nov-1930  Today's Date: 12/24/2018 Time: SLP Start Time (ACUTE ONLY): 38 SLP Stop Time (ACUTE ONLY): 1101 SLP Time Calculation (min) (ACUTE ONLY): 41 min  Past Medical History:  Past Medical History:  Diagnosis Date  . Bronchiectasis   . COPD (chronic obstructive pulmonary disease) ([place])   . Depression with anxiety   . Essential hypertension   . GERD (gastroesophageal reflux disease)   . Hashimoto's thyroiditis   . Hypothyroidism   . Vocal cord paralysis    Past Surgical History:  Past Surgical History:  Procedure Laterality Date  . APPENDECTOMY    . HEMORRHOID SURGERY    . hysteroscopy x 2    . THYROIDECTOMY    . TUBAL LIGATION     HPI:  85 yo female adm to [facility] with pna after daughter noted she has AMS via phone.  Pt found to have a cavitary lesion.  PMH + for COPD, chronic aspiration, GERD, recurrent pna, depression anxiety, Hashimoto's s/p thyroidectomy with subsequent vocal cord paralysis.  She has previously seen ENT Dr Janace Hoard who offered to stent her vocal fold but she declined per Dr Janee Morn notes from approximately 2015.  Pt admits she has dysphagia for several years causing her to "choke" - meaning "cough" during and after intake. She admits coughing extensively that she refluxed and aspirated prior to admission.  She does take Prilosec and thus reports no burning with refluxing.  Pt admits to "choking" on food and drink - mostly food but also water.  She reports taking medicine with applesauce as if she does not she may choke.  Pt's prior imaging studies have shown patulous thoracic esophagus with fluid line concerning for esophageal dysmotility and/or reflux.   Assessment / Plan / Recommendation Clinical Impression  Patient presents with clinical indications of possible pharyngo-cervical esophageal dysphagia *(known concern for reflux and possible esophageal  dysmotility due to patulous esophagus and air fluid line on prior CT chest.)  Following swallow of thin liquids, pt presents with throat clear and delayed cough.  Due to paralyzed vocal fold since 1978 after thyroidectomy and chronic reflux, she may silently aspirate.  She does admit to becoming frequently choked with both food and water.  As pt reports coughing after intake which prompted reflux and probable aspiration.  Session ended as pt required use of restroom - advised would return later.   SLP Visit Diagnosis: Dysphagia, pharyngoesophageal phase (R13.14);Dysphagia, unspecified (R13.10)    Aspiration Risk  Moderate aspiration risk    Diet Recommendation Regular;Thin liquid   Liquid Administration via: Cup;Straw Medication Administration: Whole meds with puree Supervision: Patient able to self feed Compensations: Slow rate;Small sips/bites Postural Changes: Seated upright at 90 degrees;Remain upright for at least 30 minutes after po intake    Other  Recommendations Oral Care Recommendations: Oral care BID   Follow up Recommendations (tbd)      Frequency and Duration min 1 x/week          Prognosis Prognosis for Safe Diet Advancement: Fair Barriers to Reach Goals: Severity of deficits;Time post onset      Swallow Study   General Date of Onset: 12/24/18 HPI: 85 yo female adm to [facility] with pna after daughter noted she has AMS via phone.  Pt found to have a cavitary lesion.  PMH + for COPD, chronic aspiration, GERD, recurrent pna, depression anxiety, Hashimoto's s/p thyroidectomy with subsequent vocal cord paralysis.  She has previously  seen ENT Dr Janace Hoard who offered to stent her vocal fold but she declined per Dr Janee Morn notes from approximately 2015.  Pt admits she has dysphagia for several years causing her to "choke" - meaning "cough" during and after intake. She admits coughing extensively that she refluxed and aspirated prior to admission.  She does take Prilosec and thus reports  no burning with refluxing.  Pt admits to "choking" on food and drink - mostly food but also water.  She reports taking medicine with applesauce as if she does not she may choke.  Pt's prior imaging studies have shown patulous thoracic esophagus with fluid line concerning for esophageal dysmotility and/or reflux. Type of Study: Bedside Swallow Evaluation Diet Prior to this Study: Regular;Thin liquids Temperature Spikes Noted: No Respiratory Status: Nasal cannula History of Recent Intubation: No Behavior/Cognition: Alert;Cooperative;Pleasant mood Oral Cavity Assessment: Erythema Oral Care Completed by SLP: No Oral Cavity - Dentition: Adequate natural dentition(lower partial, upper intact) Vision: Functional for self-feeding Self-Feeding Abilities: Able to feed self Patient Positioning: Upright in bed Baseline Vocal Quality: Suspected CN X (Vagus) involvement;Hoarse;Breathy(pt is dyphonic, has h/o paralyzed vocal fold from 1978) Volitional Cough: Strong Volitional Swallow: Unable to elicit(xerostomia)    Oral/Motor/Sensory Function Overall Oral Motor/Sensory Function: Mild impairment Facial ROM: Within Functional Limits Facial Symmetry: Abnormal symmetry left Facial Strength: Within Functional Limits Facial Sensation: Within Functional Limits Lingual ROM: Within Functional Limits Lingual Symmetry: Within Functional Limits Lingual Strength: Within Functional Limits Lingual Sensation: Within Functional Limits Velum: Within Functional Limits Mandible: (dnt)   Ice Chips Ice chips: Not tested   Thin Liquid Thin Liquid: Impaired Presentation: Cup;Straw Pharyngeal  Phase Impairments: Throat Clearing - Delayed;Cough - Delayed    Nectar Thick Nectar Thick Liquid: Not tested   Honey Thick Honey Thick Liquid: Not tested   Puree Puree: Not tested   Solid     Solid: Not tested      [name] 12/24/2018,12:44 PM   Alexis Salk, MS [place] Pager  [phone] Office [phone]

## 2018-12-24 NOTE — Progress Notes (Signed)
Pharmacy Antibiotic Note  [name] is a 85 y.o. female admitted on 12/22/2018 with aspiration PNA.  Pharmacy has been consulted for ampicillin/sulbactam dosing.  Patient is being followed by ID - pharmacy consulted to dose ampicillin/sulbactam for cavitary PNA. Pt also has + streptococcus viridans blood culture. Antibiotics being changed from levofloxacin to amp/sulbactam.  Today, 12/24/18  WBC 9.4 - WNL  SCr WNL. TBW ~35 kg. CrCl ~28 mL/min  Plan:  Ampicillin/sulbactam 3 g IV q12h  Follow culture data, renal function, and ID plan.  Height: 5\' 3"  (160 cm) Weight: 77 lb (34.9 kg) IBW/kg (Calculated) : 52.4  Temp (24hrs), Avg:98 F (36.7 C), Min:97.5 F (36.4 C), Max:98.3 F (36.8 C)  Recent Labs  Lab 12/22/18 1918 12/22/18 2015 12/23/18 0402 12/24/18 0436  WBC 10.5  --   --  9.4  CREATININE  --  0.53 0.42* 0.36*  LATICACIDVEN 1.1  --   --   --     Estimated Creatinine Clearance: 27.8 mL/min (A) (by C-G formula based on SCr of 0.36 mg/dL (L)).    Allergies  Allergen Reactions  . Ambien [Zolpidem]     Foggy mind-gets up and walks around (pt does not remember anything of the event)  . Asa [Aspirin] Other (See Comments)    Spits up blood  . Boniva [Ibandronic Acid] Other (See Comments)    GI upset  . Cystex [Germanium] Nausea Only  . Doxycycline Nausea Only and Hypertension    Antimicrobials this admission: levofloxacin 6/15 >> 6/16 Ampicillin/sulbactam 6/16 >>   Dose adjustments this admission:  Microbiology results: 6/14 BCx: 2/4 strep viridans 6/15 respiratory: few GPC in pairs, rare GPR 6/14 COVID: Negative  Thank you for allowing pharmacy to be a part of this patient's care.  Lenis Noon, PharmD 12/24/2018 4:27 PM

## 2018-12-24 NOTE — Progress Notes (Signed)
  Speech Language Pathology Treatment: Dysphagia  Patient Details Name: [name] MRN: [id number] DOB: 1930/09/12 Today's Date: 12/24/2018 Time: 7673-4193 SLP Time Calculation (min) (ACUTE ONLY): 26 min  Assessment / Plan / Recommendation Clinical Impression  Discussion re: benefits risks of MBS, multifactorial dysphagia - with vocal fold paresis along with reflux/probable esophageal dysmotility, pt advised she would like to proceed with MBS therefore on schedule today at approx 1400.  Advised pt that goal is to mitigate aspiration risk not prevent it and suspect she will have ongoing recurrent aspiration but MBS will help to isolate swallow function in oropharynx.      HPI HPI: 85 yo female adm to [facility] with pna after daughter noted she has AMS via phone.  Pt found to have a cavitary lesion.  PMH + for COPD, chronic aspiration, GERD, recurrent pna, depression anxiety, Hashimoto's s/p thyroidectomy with subsequent vocal cord paralysis.  She has previously seen ENT Dr Janace Hoard who offered to stent her vocal fold but she declined per Dr Janee Morn notes from approximately 2015.  Pt admits she has dysphagia for several years causing her to "choke" - meaning "cough" during and after intake. She admits coughing extensively that she refluxed and aspirated prior to admission.  She does take Prilosec and thus reports no burning with refluxing.  Pt admits to "choking" on food and drink - mostly food but also water.  She reports taking medicine with applesauce as if she does not she may choke.  Pt's prior imaging studies have shown patulous thoracic esophagus with fluid line concerning for esophageal dysmotility and/or reflux.      SLP Plan  MBS       Recommendations  Diet recommendations: Regular;Thin liquid Liquids provided via: Cup;Straw Medication Administration: Whole meds with puree Supervision: Patient able to self feed Compensations: Slow rate;Small sips/bites(start all intake with liquids,  follow solids with liquids) Postural Changes and/or Swallow Maneuvers: Seated upright 90 degrees;Upright 30-60 min after meal                Oral Care Recommendations: Oral care BID Follow up Recommendations: (tbd) SLP Visit Diagnosis: Dysphagia, pharyngoesophageal phase (R13.14);Dysphagia, unspecified (R13.10) Plan: MBS       GO                Macario Golds 12/24/2018, 12:56 PM  Luanna Salk, [facility] SLP Acute Rehab Services Pager [phone] Office [phone]

## 2018-12-24 NOTE — Progress Notes (Signed)
PROGRESS NOTE    [name]  VEH:[id number] DOB: 13-Apr-1931 DOA: 12/22/2018 PCP: Lujean Amel, MD    Brief Narrative:  HPI per Dr. Rodolph Bong is a 85 y.o. female with medical history significant of bronchiectasis, COPD, recurrent pneumonia, hypertension, hypothyroidism, Hashimoto's thyroiditis and GERD with vocal cord paralysis who presented to the ER with significant shortness of breath and cough.  She has had recurrent pneumonias in the past so patient believes she has another pneumonia.  She has been taking her MDIs and other medications at home with no relief.  In the ER she was found to be hypoxic cachectic woman who is struggling to breathe.  X-rays and CT chest were performed showing cavitary pneumonia which is multiloculated.  Suspected aspiration pneumonitis with abscesses.  Patient has been admitted to the hospital for treatment.  She denied any hemoptysis.  Denied any fever or chills.  Denied any lung injury.  She has chest pain rated as 6 out of 10 occasionally with the coughing..  ED Course: Temperature is 98.4, blood pressure 152/72, pulse 89, respiratory 25, sats 88% on 2 L.  Patient has a white count of 10.5 hemoglobin 10.6 and platelet 178.  Sodium is 133 potassium 3.6 CO2 35 creatinine 1.53 and calcium 8.2.  BNP of 117.  COVID-19 screen is negative.  Chest x-ray showed increasing opacity within the left upper lobe.  Also small left effusion.  CT chest without contrast showed interval development of multiloculated thick-walled pulmonary cavities within the left upper lobe.  This measures about 4 cm.  Findings consistent with cavitary pneumonia and underlying malignancy not excluded.  Also severe bronchiectasis.  Patient is therefore being admitted for further treatment after initiate Levaquin  Assessment & Plan:   Principal Problem:   Cavitary pneumonia Active Problems:   Vocal cord paresis   Bronchiectasis with acute exacerbation (HCC)   GERD (gastroesophageal  reflux disease)   Acute on chronic respiratory failure with hypoxia (HCC)   Hyponatremia   Essential hypertension   COPD with acute exacerbation (HCC)   Depression with anxiety   Hypothyroidism   Pressure injury of skin   Chronic obstructive pulmonary disease (HCC)   Bacteremia due to Streptococcus   History of Mycobacterium avium intracellulare infection   Unspecified severe protein-calorie malnutrition (HCC)   Aortic atherosclerosis ([place])  1 acute on chronic respiratory failure with hypoxia/cavitary pneumonia/chronic bronchiectasis ?? etiology.  Initial concern for lung abscess versus aspiration pneumonia with complex anaerobic and gram-negative bacterial infection versus nocardia versus Mycobacterium species.  Due to complicated pulmonary history with vocal cord dysfunction and cavitary pneumonia as noted on CT chest pulmonary has been consulted.  Pulmonary assessed the patient and it is noted per pulmonary that patient does have a prior history of treatment for MAC in the remote past.  Pulmonary feels patient's presentation and imaging consistent with aspiration and cavitary pneumonia in the left upper lobe with concerns for possible reactivation of MAC versus aspiration pneumonia.  Patient frail and wanting to avoid procedures per pulmonary and as such bronchoscopy will be avoided at this time.  Sputum Gram stain and cultures pending.  Sputum for AFB pending.  SARS coronavirus 2 negative.  Patient afebrile.  Continue IV Levaquin.  Continue Pulmicort and scheduled duo nebs, Flonase, Claritin, PPI.  Aspiration precautions.  Patient has been assessed by speech therapy and patient for MBS today.  Pulmonary following and appreciate the input and recommendations.  2.  Strep viridans bacteremia versus contamination 1 out of 2  blood cultures positive for Streptococcus viridans bacteremia.  Source likely secondary to cavitary pneumonia.  Will check a 2D echo.  Patient on IV Levaquin which we will  continue for now.  Will likely need repeat blood cultures in 48 hours to ensure clearing.  Will consult with ID for further evaluation and recommendations.  3.  Hypokalemia/hypomagnesemia Potassium at 3.9.  Magnesium at 2.1.  Follow..  4.  Hypothyroidism Continue home dose Synthroid.  5.  COPD Stable.  Continue Pulmicort, scheduled duo nebs, Flonase, Claritin, PPI.  Pulmonary following.  6.  Hypertension Stable.  Continue Norvasc.  7.  Depression/anxiety Continue home regimen Elavil, Zoloft, Xanax.  Follow.  8.  Gastroesophageal reflux disease Continue PPI.  9.  Hyponatremia Likely secondary to volume depletion.  Improving with gentle hydration.  Follow.  10.  Stage II sacral pressure injury, POA Continue current wound care.  11.  Constipation Place on MiraLAX 17 g daily as well as Senokot-S twice daily.  12.  Severe protein calorie malnutrition We will place on nutritional supplementation.   DVT prophylaxis: Lovenox Code Status: Full Family Communication: Updated patient.  Updated daughter via telephone.   Disposition Plan: To be determined.   Consultants:   PCCM/pulmonary: Dr. Elsworth Soho 12/23/2018  Infectious disease pending  Procedures:   CT chest 12/22/2018  Chest x-ray 12/22/2018  Antimicrobials:   IV Levaquin 12/22/2018   Subjective: Patient sitting in bed with speech therapy at bedside.  Patient states some improvement with shortness of breath since admission however not at baseline.  Patient denies any chest pain.  Per speech therapy patient with complaints of constipation.   Objective: Vitals:   12/23/18 2124 12/24/18 0542 12/24/18 0800 12/24/18 1256  BP: (!) 144/70 134/66  (!) 157/74  Pulse: 89 81  99  Resp: 20 (!) 24  16  Temp: 98.2 F (36.8 C) (!) 97.5 F (36.4 C)  98.3 F (36.8 C)  TempSrc: Oral Oral  Oral  SpO2: 98% 100% 100% 98%  Weight:      Height:        Intake/Output Summary (Last 24 hours) at 12/24/2018 1521 Last data filed at  12/24/2018 1311 Gross per 24 hour  Intake 1665.14 ml  Output 700 ml  Net 965.14 ml   Filed Weights   12/22/18 1904  Weight: 34.9 kg    Examination:  General exam: Frail. Respiratory system: Coarse diffuse rhonchorous breath sounds.  No wheezing.  No crackles.  Speaking in full sentences. Respiratory effort normal. Cardiovascular system: Regular rate and rhythm no murmurs rubs or gallops.  No JVD.  No lower extremity edema. . Gastrointestinal system: Abdomen is soft, nontender, nondistended, positive bowel sounds.  No rebound.  No guarding.  Central nervous system: Alert and oriented. No focal neurological deficits. Extremities: Symmetric 5 x 5 power. Skin: No rashes, lesions or ulcers Psychiatry: Judgement and insight appear normal. Mood & affect appropriate.     Data Reviewed: I have personally reviewed following labs and imaging studies  CBC: Recent Labs  Lab 12/22/18 1918 12/24/18 0436  WBC 10.5 9.4  NEUTROABS 7.7 6.7  HGB 10.6* 10.4*  HCT 33.3* 33.8*  MCV 88.6 90.9  PLT 178 448   Basic Metabolic Panel: Recent Labs  Lab 12/22/18 2015 12/23/18 0402 12/24/18 0436  NA 133* 136 136  K 3.6 2.8* 3.9  CL 90* 92* 95*  CO2 35* 36* 32  GLUCOSE 139* 120* 98  BUN _0 CREATININE 0.53 0.42* 0.36*  CALCIUM 8.2* 8.1* 7.9*  MG  --  1.5* 2.1   GFR: Estimated Creatinine Clearance: 27.8 mL/min (A) (by C-G formula based on SCr of 0.36 mg/dL (L)). Liver Function Tests: Recent Labs  Lab 12/23/18 0402  AST 17  ALT 14  ALKPHOS 94  BILITOT 0.3  PROT 6.9  ALBUMIN 2.6*   No results for input(s): LIPASE, AMYLASE in the last 168 hours. No results for input(s): AMMONIA in the last 168 hours. Coagulation Profile: No results for input(s): INR, PROTIME in the last 168 hours. Cardiac Enzymes: Recent Labs  Lab 12/22/18 1918  TROPONINI <0.03   BNP (last 3 results) No results for input(s): PROBNP in the last 8760 hours. HbA1C: No results for input(s): HGBA1C in the  last 72 hours. CBG: No results for input(s): GLUCAP in the last 168 hours. Lipid Profile: No results for input(s): CHOL, HDL, LDLCALC, TRIG, CHOLHDL, LDLDIRECT in the last 72 hours. Thyroid Function Tests: No results for input(s): TSH, T4TOTAL, FREET4, T3FREE, THYROIDAB in the last 72 hours. Anemia Panel: No results for input(s): VITAMINB12, FOLATE, FERRITIN, TIBC, IRON, RETICCTPCT in the last 72 hours. Sepsis Labs: Recent Labs  Lab 12/22/18 1918  LATICACIDVEN 1.1    Recent Results (from the past 240 hour(s))  SARS Coronavirus 2 (CEPHEID - Performed in [facility] hospital lab), Hosp Order     Status: None   Collection Time: 12/22/18  7:18 PM   Specimen: Nasopharyngeal Swab  Result Value Ref Range Status   SARS Coronavirus 2 NEGATIVE NEGATIVE Final    Comment: (NOTE) If result is NEGATIVE SARS-CoV-2 target nucleic acids are NOT DETECTED. The SARS-CoV-2 RNA is generally detectable in upper and lower  respiratory specimens during the acute phase of infection. The lowest  concentration of SARS-CoV-2 viral copies this assay can detect is 250  copies / mL. A negative result does not preclude SARS-CoV-2 infection  and should not be used as the sole basis for treatment or other  patient management decisions.  A negative result may occur with  improper specimen collection / handling, submission of specimen other  than nasopharyngeal swab, presence of viral mutation(s) within the  areas targeted by this assay, and inadequate number of viral copies  (<250 copies / mL). A negative result must be combined with clinical  observations, patient history, and epidemiological information. If result is POSITIVE SARS-CoV-2 target nucleic acids are DETECTED. The SARS-CoV-2 RNA is generally detectable in upper and lower  respiratory specimens dur ing the acute phase of infection.  Positive  results are indicative of active infection with SARS-CoV-2.  Clinical  correlation with patient history and  other diagnostic information is  necessary to determine patient infection status.  Positive results do  not rule out bacterial infection or co-infection with other viruses. If result is PRESUMPTIVE POSTIVE SARS-CoV-2 nucleic acids MAY BE PRESENT.   A presumptive positive result was obtained on the submitted specimen  and confirmed on repeat testing.  While 2019 novel coronavirus  (SARS-CoV-2) nucleic acids may be present in the submitted sample  additional confirmatory testing may be necessary for epidemiological  and / or clinical management purposes  to differentiate between  SARS-CoV-2 and other Sarbecovirus currently known to infect humans.  If clinically indicated additional testing with an alternate test  methodology [phone]) is advised. The SARS-CoV-2 RNA is generally  detectable in upper and lower respiratory sp ecimens during the acute  phase of infection. The expected result is Negative. Fact Sheet for Patients:  StrictlyIdeas.no Fact Sheet for Healthcare Providers: BankingDealers.co.za This  test is not yet approved or cleared by the [place] and has been authorized for detection and/or diagnosis of SARS-CoV-2 by FDA under an Emergency Use Authorization (EUA).  This EUA will remain in effect (meaning this test can be used) for the duration of the COVID-19 declaration under Section 564(b)(1) of the Act, 21 U.S.C. section 360bbb-3(b)(1), unless the authorization is terminated or revoked sooner. Performed at [facility], [place] [address], [place], [place] [address]   Culture, blood (routine x 2) Call MD if unable to obtain prior to antibiotics being given     Status: None (Preliminary result)   Collection Time: 12/22/18 11:46 PM   Specimen: BLOOD  Result Value Ref Range Status   Specimen Description   Final    BLOOD BLOOD RIGHT ARM Performed at [place] [address]  [place], [place] [address]    Special Requests   Final    BOTTLES DRAWN AEROBIC AND ANAEROBIC Blood Culture adequate volume Performed at [place] [address], [place], [place] [address]    Culture   Final    NO GROWTH 1 DAY Performed at [facility], [place] [address] [place], [place] [address]    Report Status PENDING  Incomplete  Culture, blood (routine x 2) Call MD if unable to obtain prior to antibiotics being given     Status: Abnormal (Preliminary result)   Collection Time: 12/22/18 11:46 PM   Specimen: BLOOD  Result Value Ref Range Status   Specimen Description   Final    BLOOD BLOOD LEFT FOREARM Performed at [facility], [place] [address], [place], [place] [address]    Special Requests   Final    BOTTLES DRAWN AEROBIC AND ANAEROBIC Blood Culture adequate volume Performed at [place] [address], [place], [place] [address]    Culture  Setup Time   Final    GRAM POSITIVE COCCI IN CHAINS IN PAIRS IN BOTH AEROBIC AND ANAEROBIC BOTTLES Organism ID to follow CRITICAL RESULT CALLED TO, READ BACK BY AND VERIFIED WITH: Colin Rhein [facility] 5830 12/23/18 A BROWNING Performed at [facility], [place] [address], [place], [place] [address]    Culture VIRIDANS STREPTOCOCCUS (A)  Final   Report Status PENDING  Incomplete  Blood Culture ID Panel (Reflexed)     Status: Abnormal   Collection Time: 12/22/18 11:46 PM  Result Value Ref Range Status   Enterococcus species NOT DETECTED NOT DETECTED Final   Listeria monocytogenes NOT DETECTED NOT DETECTED Final   Staphylococcus species NOT DETECTED NOT DETECTED Final   Staphylococcus aureus (BCID) NOT DETECTED NOT DETECTED Final   Streptococcus species DETECTED (A) NOT DETECTED Final    Comment: Not Enterococcus species, Streptococcus agalactiae, Streptococcus pyogenes, or Streptococcus pneumoniae. CRITICAL RESULT CALLED TO, READ BACK BY AND VERIFIED WITH: Colin Rhein PHARMD [id number]  12/23/18 A BROWNING    Streptococcus agalactiae NOT DETECTED NOT DETECTED Final   Streptococcus pneumoniae NOT DETECTED NOT DETECTED Final   Streptococcus pyogenes NOT DETECTED NOT DETECTED Final   Acinetobacter baumannii NOT DETECTED NOT DETECTED Final   Enterobacteriaceae species NOT DETECTED NOT DETECTED Final   Enterobacter cloacae complex NOT DETECTED NOT DETECTED Final   Escherichia coli NOT DETECTED NOT DETECTED Final   Klebsiella oxytoca NOT DETECTED NOT DETECTED Final   Klebsiella pneumoniae NOT DETECTED NOT DETECTED Final   Proteus species NOT DETECTED NOT DETECTED Final   Serratia marcescens NOT DETECTED NOT  DETECTED Final   Haemophilus influenzae NOT DETECTED NOT DETECTED Final   Neisseria meningitidis NOT DETECTED NOT DETECTED Final   Pseudomonas aeruginosa NOT DETECTED NOT DETECTED Final   Candida albicans NOT DETECTED NOT DETECTED Final   Candida glabrata NOT DETECTED NOT DETECTED Final   Candida krusei NOT DETECTED NOT DETECTED Final   Candida parapsilosis NOT DETECTED NOT DETECTED Final   Candida tropicalis NOT DETECTED NOT DETECTED Final    Comment: Performed at [facility], [place] [address] [place], [place] [address]  Culture, sputum-assessment     Status: None   Collection Time: 12/23/18 12:51 AM   Specimen: Expectorated Sputum  Result Value Ref Range Status   Specimen Description EXPECTORATED SPUTUM  Final   Special Requests NONE  Final   Sputum evaluation   Final    THIS SPECIMEN IS ACCEPTABLE FOR SPUTUM CULTURE Performed at [facility], [place] [address], [place], [place] [address]    Report Status 12/23/2018 FINAL  Final  Culture, respiratory     Status: None (Preliminary result)   Collection Time: 12/23/18 12:51 AM  Result Value Ref Range Status   Specimen Description   Final    EXPECTORATED SPUTUM Performed at [place] [address], [place], [place] [address]    Special Requests   Final    NONE Reflexed  from [id number] Performed at [place] [address], [place], [place] [address]    Gram Stain   Final    MODERATE WBC PRESENT,BOTH PMN AND MONONUCLEAR FEW GRAM POSITIVE COCCI IN PAIRS RARE GRAM POSITIVE RODS    Culture   Final    CULTURE REINCUBATED FOR BETTER GROWTH Performed at [facility], [address], [place], [place] [address]    Report Status PENDING  Incomplete  Expectorated sputum assessment w rflx to resp cult     Status: None   Collection Time: 12/23/18 11:31 AM   Specimen: Sputum  Result Value Ref Range Status   Specimen Description SPUTUM  Final   Special Requests NONE  Final   Sputum evaluation   Final    THIS SPECIMEN IS ACCEPTABLE FOR SPUTUM CULTURE Performed at [facility], [place] [address], [place], [place] [address]    Report Status 12/23/2018 FINAL  Final  Culture, respiratory     Status: None (Preliminary result)   Collection Time: 12/23/18 11:31 AM   Specimen: SPU  Result Value Ref Range Status   Specimen Description   Final    SPUTUM Performed at [place] [address], [place], [place] [address]    Special Requests   Final    NONE Reflexed from [id number] Performed at [facility], [place] [address] [place], [place] [address]    Gram Stain   Final    ABUNDANT WBC PRESENT,BOTH PMN AND MONONUCLEAR MODERATE GRAM POSITIVE COCCI FEW GRAM VARIABLE ROD RARE YEAST    Culture   Final    CULTURE REINCUBATED FOR BETTER GROWTH Performed at [facility], [place] [address] [place], [place] [address]    Report Status PENDING  Incomplete         Radiology Studies: Dg Chest 2 View  Result Date: 12/22/2018 CLINICAL DATA:  Shortness of Breath, cough EXAM: CHEST - 2 VIEW COMPARISON:  05/30/2018 FINDINGS: Severe chronic lung disease with bronchiectasis and fibrosis. Increasing opacity in the left upper lobe, cannot exclude left upper lobe infiltrate/pneumonia.  Hyperinflation. Heart is  borderline in size. Small left effusion. No acute bony abnormality. IMPRESSION: Severe chronic lung disease with bronchiectasis and fibrosis.  COPD. Borderline heart size. Increasing opacity within the left upper lobe. While this could reflect worsening scarring, cannot exclude acute infiltrate/pneumonia. Small left effusion. Electronically Signed   By: Rolm Baptise M.D.   On: 12/22/2018 20:05   Ct Chest Wo Contrast  Result Date: 12/22/2018 CLINICAL DATA:  85 y/o F; Bronchiectasis, acute exacerbation Shortness of breath. EXAM: CT CHEST WITHOUT CONTRAST TECHNIQUE: Multidetector CT imaging of the chest was performed following the standard protocol without IV contrast. COMPARISON:  07/17/2017 CT chest. FINDINGS: Cardiovascular: Normal caliber thoracic aorta and main pulmonary artery. Aortic calcific atherosclerosis. Normal heart size. No pericardial effusion. Mediastinum/Nodes: No enlarged mediastinal or axillary lymph nodes. Thyroid gland, trachea, and esophagus demonstrate no significant findings. Lungs/Pleura: Stable severe cylindrical and varicoid bronchiectasis throughout the lungs. Interval increased diffuse peribronchial wall thickening. Interval development of multiloculated thick-walled pulmonary cavities within the left upper lobe, the largest measuring 34 x 40 x 39 mm (AP x ML x CC series 6, image 49 and series 5, image 28). Partial resolution of ground-glass opacities and patchy consolidations elsewhere throughout the lung. Stable findings of postinflammatory scarring greatest in the lung bases. Upper Abdomen: No acute abnormality. Musculoskeletal: Cachexia. No acute osseous abnormality identified. IMPRESSION: 1. Interval development of multiloculated thick-walled pulmonary cavities within the left upper lobe, measuring up to 4 cm. Findings probably represent cavitary pneumonia. Underlying malignancy cannot be excluded. 2. Partial resolution of ground-glass opacities and patchy  consolidations elsewhere throughout the lungs. 3. Stable severe cylindrical and varicoid bronchiectasis throughout the lungs. 4. Interval increased diffuse peribronchial thickening which may represent acute on chronic bronchitis. Electronically Signed   By: Kristine Garbe M.D.   On: 12/22/2018 21:20        Scheduled Meds: . ALPRAZolam  1 mg Oral Daily  . amitriptyline  50 mg Oral QHS  . amLODipine  5 mg Oral QPM  . budesonide (PULMICORT) nebulizer solution  0.5 mg Nebulization BID  . celecoxib  200 mg Oral QPC breakfast  . enoxaparin (LOVENOX) injection  30 mg Subcutaneous Daily  . fluticasone  2 spray Each Nare Daily  . ipratropium-albuterol  3 mL Nebulization TID  . levothyroxine  88 mcg Oral QAC breakfast  . loratadine  10 mg Oral Daily  . multivitamin with minerals  1 tablet Oral Q M,W,F  . pantoprazole  40 mg Oral Daily  . polyethylene glycol  17 g Oral Daily  . senna-docusate  1 tablet Oral BID  . sertraline  50 mg Oral Q1200   Continuous Infusions: . levofloxacin (LEVAQUIN) IV       LOS: 2 days    Time spent: 40 minutes    Irine Seal, MD [facility]  If 7PM-7AM, please contact night-coverage [email or web address] 12/24/2018, 3:21 PM

## 2018-12-24 NOTE — Consult Note (Signed)
[name] for Infectious Disease    Date of Admission:  12/22/2018           Day 2 levofloxacin       Reason for Consult: Viridans streptococcal bacteremia and cavitary pneumonia    Referring Provider: Dr. Irine Seal Primary Care Provider: Dr. Lauretta Grill [place]  Assessment: She has a smoldering, cavitary pneumonia.  This may have started as a chemical pneumonitis from aspiration of food but this probably evolved into a polymicrobial bacterial pneumonia complicated by streptococcal bacteremia.  I think a relapse of Mycobacterium avium is less likely.  I favor broadening therapy to ampicillin sulbactam to provide some anaerobic activity pending final cultures.  Plan: 1. Change levofloxacin to ampicillin sulbactam 2. Await results of final sputum and blood cultures 3. Obtain sputum for AFB stain and culture 4. Await results of echocardiogram  Principal Problem:   Cavitary pneumonia Active Problems:   Bacteremia due to Streptococcus   History of Mycobacterium avium intracellulare infection   Vocal cord paresis   Bronchiectasis with acute exacerbation (HCC)   GERD (gastroesophageal reflux disease)   Acute on chronic respiratory failure with hypoxia (HCC)   Hyponatremia   Essential hypertension   COPD with acute exacerbation (HCC)   Depression with anxiety   Hypothyroidism   Pressure injury of skin   Chronic obstructive pulmonary disease (HCC)   Unspecified severe protein-calorie malnutrition (HCC)   Aortic atherosclerosis (HCC)   Normocytic anemia   Scheduled Meds: . ALPRAZolam  1 mg Oral Daily  . amitriptyline  50 mg Oral QHS  . amLODipine  5 mg Oral QPM  . budesonide (PULMICORT) nebulizer solution  0.5 mg Nebulization BID  . celecoxib  200 mg Oral QPC breakfast  . enoxaparin (LOVENOX) injection  30 mg Subcutaneous Daily  . feeding supplement (ENSURE ENLIVE)  237 mL Oral BID BM  . fluticasone  2 spray Each Nare Daily  . ipratropium-albuterol  3 mL  Nebulization TID  . levothyroxine  88 mcg Oral QAC breakfast  . loratadine  10 mg Oral Daily  . multivitamin with minerals  1 tablet Oral Q M,W,F  . pantoprazole  40 mg Oral Daily  . polyethylene glycol  17 g Oral Daily  . senna-docusate  1 tablet Oral BID  . sertraline  50 mg Oral Q1200   Continuous Infusions: . levofloxacin (LEVAQUIN) IV     PRN Meds:.ALPRAZolam, guaiFENesin  HPI: [name] is a 85 y.o. female with a long history of bronchiectasis, O2 dependent COPD, GERD and vocal cord paralysis.  She also has a remote history of treated Mycobacterium avium pneumonia.  I can find that documented in a note by Dr. Keturah Barre from a visit in June 2008 but no other confirmation in EPIC.  She recalls that it was sometime in the 1970s.  She has initially thought to have tuberculosis than Mycobacterium avium was confirmed.  He says that she was on treatment for 4 months.    She says that she started to feel sick about 6 weeks ago.  She noted increased cough productive of dark brown sputum and worsening shortness of breath.  She has not noted any unusual smell or taste to her sputum.  She said that she was scared to come to the hospital because of the [place] pandemic.  Several weeks ago chronic for lower front teeth broke off.  It is not sore or sensitive.  She notes fairly frequent episodes of choking on  food.  She kept getting worse and was finally admitted 2 days ago.  Cultures were obtained and she was started on empiric levofloxacin.  1 of 2 admission blood cultures has grown viridans strep.  Sputum Gram stain revealed gram-positive rods, gram-positive cocci, gram variable rods and yeast.  Cultures are pending.  CT scan showed new multiloculated cavitary pneumonia in the left upper lobe.  She has always lived in [place].   Review of Systems: Review of Systems  Constitutional: Positive for malaise/fatigue and weight loss. Negative for chills, diaphoresis and fever.       She says  that she has had no appetite recently and has lost about 10 pounds unintentionally.  HENT: Negative for congestion and sore throat.   Respiratory: Positive for cough, sputum production and shortness of breath. Negative for hemoptysis and wheezing.   Cardiovascular: Negative for chest pain.  Gastrointestinal: Positive for heartburn. Negative for abdominal pain, diarrhea, nausea and vomiting.  Genitourinary: Negative for dysuria.  Skin: Negative for rash.  Neurological: Negative for headaches.    Past Medical History:  Diagnosis Date  . Bronchiectasis   . COPD (chronic obstructive pulmonary disease) ([place])   . Depression with anxiety   . Essential hypertension   . GERD (gastroesophageal reflux disease)   . Hashimoto's thyroiditis   . Hypothyroidism   . Vocal cord paralysis     Social History   Tobacco Use  . Smoking status: Never Smoker  . Smokeless tobacco: Never Used  Substance Use Topics  . Alcohol use: Not Currently  . Drug use: Never    Family History  Problem Relation Age of Onset  . COPD Father   . Hypertension Father    Allergies  Allergen Reactions  . Ambien [Zolpidem]     Foggy mind-gets up and walks around (pt does not remember anything of the event)  . Asa [Aspirin] Other (See Comments)    Spits up blood  . Boniva [Ibandronic Acid] Other (See Comments)    GI upset  . Cystex [Germanium] Nausea Only  . Doxycycline Nausea Only and Hypertension    OBJECTIVE: Blood pressure (!) 157/74, pulse 99, temperature 98.3 F (36.8 C), temperature source Oral, resp. rate 16, height 5\' 3"  (1.6 m), weight 34.9 kg, SpO2 98 %.  Physical Exam Constitutional:      Comments: She is resting quietly in bed.  She is cachectic.  She appears quite weak.  She is very pleasant and talkative.  Her voice is hoarse.  HENT:     Mouth/Throat:     Pharynx: No oropharyngeal exudate.     Comments: 1 front lower tooth is broken.  Remaining teeth in fair condition only. Eyes:      Conjunctiva/sclera: Conjunctivae normal.  Cardiovascular:     Rate and Rhythm: Normal rate and regular rhythm.     Heart sounds: No murmur.  Pulmonary:     Effort: Pulmonary effort is normal.     Breath sounds: No wheezing or rales.     Comments: She is using supplemental nasal prong oxygen.  She has very prominent crackles diffusely with occasional wheezes. Abdominal:     Palpations: Abdomen is soft.     Tenderness: There is no abdominal tenderness.  Skin:    Comments: She has early pressure spots on her back and buttocks.  Psychiatric:        Mood and Affect: Mood normal.      Chest CT scan 12/22/2018  IMPRESSION: 1. Interval development of multiloculated thick-walled  pulmonary cavities within the left upper lobe, measuring up to 4 cm. Findings probably represent cavitary pneumonia. Underlying malignancy cannot be excluded. 2. Partial resolution of ground-glass opacities and patchy consolidations elsewhere throughout the lungs. 3. Stable severe cylindrical and varicoid bronchiectasis throughout the lungs. 4. Interval increased diffuse peribronchial thickening which may represent acute on chronic bronchitis.    By: Kristine Garbe M.D.   On: 12/22/2018 21:20  Lab Results Lab Results  Component Value Date   WBC 9.4 12/24/2018   HGB 10.4 (L) 12/24/2018   HCT 33.8 (L) 12/24/2018   MCV 90.9 12/24/2018   PLT 293 12/24/2018    Lab Results  Component Value Date   CREATININE 0.36 (L) 12/24/2018   BUN 10 12/24/2018   NA 136 12/24/2018   K 3.9 12/24/2018   CL 95 (L) 12/24/2018   CO2 32 12/24/2018    Lab Results  Component Value Date   ALT 14 12/23/2018   AST 17 12/23/2018   ALKPHOS 94 12/23/2018   BILITOT 0.3 12/23/2018     Microbiology: Recent Results (from the past 240 hour(s))  SARS Coronavirus 2 (CEPHEID - Performed in [place] hospital lab), Hosp Order     Status: None   Collection Time: 12/22/18  7:18 PM   Specimen: Nasopharyngeal Swab  Result  Value Ref Range Status   SARS Coronavirus 2 NEGATIVE NEGATIVE Final    Comment: (NOTE) If result is NEGATIVE SARS-CoV-2 target nucleic acids are NOT DETECTED. The SARS-CoV-2 RNA is generally detectable in upper and lower  respiratory specimens during the acute phase of infection. The lowest  concentration of SARS-CoV-2 viral copies this assay can detect is 250  copies / mL. A negative result does not preclude SARS-CoV-2 infection  and should not be used as the sole basis for treatment or other  patient management decisions.  A negative result may occur with  improper specimen collection / handling, submission of specimen other  than nasopharyngeal swab, presence of viral mutation(s) within the  areas targeted by this assay, and inadequate number of viral copies  (<250 copies / mL). A negative result must be combined with clinical  observations, patient history, and epidemiological information. If result is POSITIVE SARS-CoV-2 target nucleic acids are DETECTED. The SARS-CoV-2 RNA is generally detectable in upper and lower  respiratory specimens dur ing the acute phase of infection.  Positive  results are indicative of active infection with SARS-CoV-2.  Clinical  correlation with patient history and other diagnostic information is  necessary to determine patient infection status.  Positive results do  not rule out bacterial infection or co-infection with other viruses. If result is PRESUMPTIVE POSTIVE SARS-CoV-2 nucleic acids MAY BE PRESENT.   A presumptive positive result was obtained on the submitted specimen  and confirmed on repeat testing.  While 2019 novel coronavirus  (SARS-CoV-2) nucleic acids may be present in the submitted sample  additional confirmatory testing may be necessary for epidemiological  and / or clinical management purposes  to differentiate between  SARS-CoV-2 and other Sarbecovirus currently known to infect humans.  If clinically indicated additional testing  with an alternate test  methodology [phone]) is advised. The SARS-CoV-2 RNA is generally  detectable in upper and lower respiratory sp ecimens during the acute  phase of infection. The expected result is Negative. Fact Sheet for Patients:  StrictlyIdeas.no Fact Sheet for Healthcare Providers: BankingDealers.co.za This test is not yet approved or cleared by the [place] FDA and has been authorized for detection and/or diagnosis  of SARS-CoV-2 by FDA under an Emergency Use Authorization (EUA).  This EUA will remain in effect (meaning this test can be used) for the duration of the COVID-19 declaration under Section 564(b)(1) of the Act, 21 U.S.C. section 360bbb-3(b)(1), unless the authorization is terminated or revoked sooner. Performed at [facility], [place] [address] [place], [place] [address]   Culture, blood (routine x 2) Call MD if unable to obtain prior to antibiotics being given     Status: None (Preliminary result)   Collection Time: 12/22/18 11:46 PM   Specimen: BLOOD  Result Value Ref Range Status   Specimen Description   Final    BLOOD BLOOD RIGHT ARM Performed at [place] [address] [place], [place] [address]    Special Requests   Final    BOTTLES DRAWN AEROBIC AND ANAEROBIC Blood Culture adequate volume Performed at [place] [address], [place], [place] [address]    Culture   Final    NO GROWTH 1 DAY Performed at [facility], [place] [address] [place], [place] [address]    Report Status PENDING  Incomplete  Culture, blood (routine x 2) Call MD if unable to obtain prior to antibiotics being given     Status: Abnormal (Preliminary result)   Collection Time: 12/22/18 11:46 PM   Specimen: BLOOD  Result Value Ref Range Status   Specimen Description   Final    BLOOD BLOOD LEFT FOREARM Performed at [facility], [place]  [address] [place], [place] [address]    Special Requests   Final    BOTTLES DRAWN AEROBIC AND ANAEROBIC Blood Culture adequate volume Performed at [place] [address] [place], [place] [address]    Culture  Setup Time   Final    GRAM POSITIVE COCCI IN CHAINS IN PAIRS IN BOTH AEROBIC AND ANAEROBIC BOTTLES Organism ID to follow CRITICAL RESULT CALLED TO, READ BACK BY AND VERIFIED WITH: Colin Rhein [facility] [id number] 12/23/18 A BROWNING Performed at [facility], [place] [address] [place],  [address]    Culture VIRIDANS STREPTOCOCCUS (A)  Final   Report Status PENDING  Incomplete  Blood Culture ID Panel (Reflexed)     Status: Abnormal   Collection Time: 12/22/18 11:46 PM  Result Value Ref Range Status   Enterococcus species NOT DETECTED NOT DETECTED Final   Listeria monocytogenes NOT DETECTED NOT DETECTED Final   Staphylococcus species NOT DETECTED NOT DETECTED Final   Staphylococcus aureus (BCID) NOT DETECTED NOT DETECTED Final   Streptococcus species DETECTED (A) NOT DETECTED Final    Comment: Not Enterococcus species, Streptococcus agalactiae, Streptococcus pyogenes, or Streptococcus pneumoniae. CRITICAL RESULT CALLED TO, READ BACK BY AND VERIFIED WITH: Colin Rhein PHARMD [id number] 12/23/18 A BROWNING    Streptococcus agalactiae NOT DETECTED NOT DETECTED Final   Streptococcus pneumoniae NOT DETECTED NOT DETECTED Final   Streptococcus pyogenes NOT DETECTED NOT DETECTED Final   Acinetobacter baumannii NOT DETECTED NOT DETECTED Final   Enterobacteriaceae species NOT DETECTED NOT DETECTED Final   Enterobacter cloacae complex NOT DETECTED NOT DETECTED Final   Escherichia coli NOT DETECTED NOT DETECTED Final   Klebsiella oxytoca NOT DETECTED NOT DETECTED Final   Klebsiella pneumoniae NOT DETECTED NOT DETECTED Final   Proteus species NOT DETECTED NOT DETECTED Final   Serratia marcescens NOT DETECTED NOT DETECTED Final   Haemophilus influenzae NOT DETECTED NOT DETECTED  Final   Neisseria meningitidis NOT DETECTED NOT DETECTED Final  Pseudomonas aeruginosa NOT DETECTED NOT DETECTED Final   Candida albicans NOT DETECTED NOT DETECTED Final   Candida glabrata NOT DETECTED NOT DETECTED Final   Candida krusei NOT DETECTED NOT DETECTED Final   Candida parapsilosis NOT DETECTED NOT DETECTED Final   Candida tropicalis NOT DETECTED NOT DETECTED Final    Comment: Performed at [facility], [place] [address], [place], [place] [address]  Culture, sputum-assessment     Status: None   Collection Time: 12/23/18 12:51 AM   Specimen: Expectorated Sputum  Result Value Ref Range Status   Specimen Description EXPECTORATED SPUTUM  Final   Special Requests NONE  Final   Sputum evaluation   Final    THIS SPECIMEN IS ACCEPTABLE FOR SPUTUM CULTURE Performed at [facility], [place] [address] [place], [place] [address]    Report Status 12/23/2018 FINAL  Final  Culture, respiratory     Status: None (Preliminary result)   Collection Time: 12/23/18 12:51 AM  Result Value Ref Range Status   Specimen Description   Final    EXPECTORATED SPUTUM Performed at [place] [address], [place], [place] [address]    Special Requests   Final    NONE Reflexed from [id number] Performed at [place] [address] [place], [place] [address]    Gram Stain   Final    MODERATE WBC PRESENT,BOTH PMN AND MONONUCLEAR FEW GRAM POSITIVE COCCI IN PAIRS RARE GRAM POSITIVE RODS    Culture   Final    CULTURE REINCUBATED FOR BETTER GROWTH Performed at [facility], [address], [place], [place] [address]    Report Status PENDING  Incomplete  Expectorated sputum assessment w rflx to resp cult     Status: None   Collection Time: 12/23/18 11:31 AM   Specimen: Sputum  Result Value Ref Range Status   Specimen Description SPUTUM  Final   Special Requests NONE  Final   Sputum evaluation   Final    THIS SPECIMEN IS ACCEPTABLE  FOR SPUTUM CULTURE Performed at [facility], [place] [address], [place], [place] [address]    Report Status 12/23/2018 FINAL  Final  Culture, respiratory     Status: None (Preliminary result)   Collection Time: 12/23/18 11:31 AM   Specimen: SPU  Result Value Ref Range Status   Specimen Description   Final    SPUTUM Performed at [place] [address] [place], [place] [address]    Special Requests   Final    NONE Reflexed from [id number] Performed at [facility], [place] [address], [place], [place] [address]    Gram Stain   Final    ABUNDANT WBC PRESENT,BOTH PMN AND MONONUCLEAR MODERATE GRAM POSITIVE COCCI FEW GRAM VARIABLE ROD RARE YEAST    Culture   Final    CULTURE REINCUBATED FOR BETTER GROWTH Performed at [facility], [place] [address] [place], [place] [address]    Report Status PENDING  Incomplete    Michel Bickers, MD [facility] for Infectious [place] [phone] pager   [phone] cell 12/24/2018, 3:35 PM

## 2018-12-24 NOTE — Evaluation (Signed)
Physical Therapy Evaluation Patient Details Name: [name] MRN: [id number] DOB: [birth year] Today's Date: 12/24/2018   History of Present Illness  85 yo female admitted with cavitary pna. Hx of COPD-O2 dep, recurrent pna, chronic aspiration, vocal cord paralysis, anorexia, Hashimoto's thyroiditis  Clinical Impression  On eval, pt was Min guard assist for mobility. She performed a stand pivot and took a few ambulatory steps with use of a RW. Pt tolerated activity well. Will continue to follow and progress activity as tolerated.     Follow Up Recommendations Home health PT;Supervision - Intermittent    Equipment Recommendations  None recommended by PT    Recommendations for Other Services       Precautions / Restrictions Precautions Precautions: Fall Precaution Comments: O2 dep Restrictions Weight Bearing Restrictions: No      Mobility  Bed Mobility Overal bed mobility: Needs Assistance Bed Mobility: Supine to Sit;Sit to Supine     Supine to sit: Supervision Sit to supine: Supervision   General bed mobility comments: for safety, lines  Transfers Overall transfer level: Needs assistance Equipment used: Rolling walker (2 wheeled) Transfers: Sit to/from [facility] Sit to Stand: Min guard Stand pivot transfers: Min guard       General transfer comment: close guard for safety. stand pivot, bed to bsc, with RW  Ambulation/Gait Ambulation/Gait assistance: Min guard Gait Distance (Feet): 4 Feet Assistive device: Rolling walker (2 wheeled)       General Gait Details: close guard for safety. remained on [place] o2. pt tolerated activity well.  Stairs            Wheelchair Mobility    Modified Rankin (Stroke Patients Only)       Balance Overall balance assessment: Needs assistance           Standing balance-Leahy Scale: Fair                               Pertinent Vitals/Pain Pain Assessment: No/denies pain     Home Living Family/patient expects to be discharged to:: Private residence Living Arrangements: Alone Available Help at Discharge: Family(daughter) Type of Home: House Home Access: Level entry     Home Layout: One level Home Equipment: Environmental consultant - 2 wheels;Cane - single point      Prior Function Level of Independence: Independent with assistive device(s)         Comments: using RW     Hand Dominance        Extremity/Trunk Assessment   Upper Extremity Assessment Upper Extremity Assessment: Generalized weakness    Lower Extremity Assessment Lower Extremity Assessment: Generalized weakness    Cervical / Trunk Assessment Cervical / Trunk Assessment: Kyphotic  Communication   Communication: No difficulties  Cognition Arousal/Alertness: Awake/alert Behavior During Therapy: WFL for tasks assessed/performed Overall Cognitive Status: Within Functional Limits for tasks assessed                                        General Comments      Exercises     Assessment/Plan    PT Assessment Patient needs continued PT services  PT Problem List Decreased mobility;Decreased activity tolerance;Decreased balance;Decreased knowledge of use of DME;Decreased strength       PT Treatment Interventions DME instruction;Gait training;Therapeutic activities;Therapeutic exercise;Patient/family education;Functional mobility training;Balance training    PT Goals (Current goals can  be found in the Care Plan section)  Acute Rehab PT Goals Patient Stated Goal: home soon PT Goal Formulation: With patient Time For Goal Achievement: 01/07/19 Potential to Achieve Goals: Good    Frequency Min 3X/week   Barriers to discharge        Co-evaluation               AM-PAC PT "6 Clicks" Mobility  Outcome Measure Help needed turning from your back to your side while in a flat bed without using bedrails?: A Little Help needed moving from lying on your back to sitting  on the side of a flat bed without using bedrails?: A Little Help needed moving to and from a bed to a chair (including a wheelchair)?: A Little Help needed standing up from a chair using your arms (e.g., wheelchair or bedside chair)?: A Little Help needed to walk in hospital room?: A Little Help needed climbing 3-5 steps with a railing? : A Little 6 Click Score: 18    End of Session Equipment Utilized During Treatment: Oxygen Activity Tolerance: Patient tolerated treatment well Patient left: in bed;with call bell/phone within reach;with bed alarm set   PT Visit Diagnosis: Unsteadiness on feet (R26.81);Muscle weakness (generalized) (M62.81)    Time: 1115-5208 PT Time Calculation (min) (ACUTE ONLY): 21 min   Charges:   PT Evaluation $PT Eval Moderate Complexity: [place], PT Acute Rehabilitation Services Pager: [phone] Office: [phone]

## 2018-12-24 NOTE — Progress Notes (Signed)
  Echocardiogram 2D Echocardiogram has been performed.  Jannett Celestine 12/24/2018, 4:14 PM

## 2018-12-25 DIAGNOSIS — J690 Pneumonitis due to inhalation of food and vomit: Principal | ICD-10-CM

## 2018-12-25 LAB — CULTURE, RESPIRATORY W GRAM STAIN: Culture: NORMAL

## 2018-12-25 MED ORDER — ENOXAPARIN SODIUM 300 MG/3ML IJ SOLN
20.0000 mg | INTRAMUSCULAR | Status: DC
  Administered 2018-12-25 – 2018-12-27 (×3): 20 mg via SUBCUTANEOUS
  Filled 2018-12-25 (×4): qty 0.2

## 2018-12-25 MED ORDER — PANTOPRAZOLE SODIUM 40 MG PO TBEC
40.0000 mg | DELAYED_RELEASE_TABLET | Freq: Two times a day (BID) | ORAL | Status: DC
  Administered 2018-12-25 – 2018-12-28 (×6): 40 mg via ORAL
  Filled 2018-12-25 (×6): qty 1

## 2018-12-25 NOTE — Progress Notes (Signed)
Physical Therapy Treatment Patient Details Name: [name] MRN: [id number] DOB: Mar 25, 1931 Today's Date: 12/25/2018    History of Present Illness 85 yo female admitted with cavitary pna. Hx of COPD-O2 dep, recurrent pna, chronic aspiration, vocal cord paralysis, anorexia, Hashimoto's thyroiditis    PT Comments    Pt is making steady progress towards goals. Pt is ambulating 172ft with RW min guard assist on 3 liters o2. Sats 88% immediately after gait. Pt completed general exercises with min assist. Pt will continue to benefit from acute skilled PT unitil d/c.  Follow Up Recommendations  Home health PT;Supervision - Intermittent     Equipment Recommendations  None recommended by PT    Recommendations for Other Services       Precautions / Restrictions Precautions Precautions: Fall Precaution Comments: O2 dep Restrictions Weight Bearing Restrictions: No    Mobility  Bed Mobility Overal bed mobility: Needs Assistance Bed Mobility: Supine to Sit;Sit to Supine     Supine to sit: Supervision Sit to supine: Supervision   General bed mobility comments: for safety, lines  Transfers Overall transfer level: Needs assistance Equipment used: Rolling walker (2 wheeled) Transfers: Sit to/from Stand Sit to Stand: Min guard Stand pivot transfers: Min guard       General transfer comment: cues for hand placement  Ambulation/Gait Ambulation/Gait assistance: Min guard Gait Distance (Feet): 100 Feet Assistive device: Rolling walker (2 wheeled) Gait Pattern/deviations: Step-through pattern;Decreased stride length;Trunk flexed Gait velocity: decreased   General Gait Details: assistance with steering RW at times.   Stairs             Wheelchair Mobility    Modified Rankin (Stroke Patients Only)       Balance Overall balance assessment: Needs assistance Sitting-balance support: No upper extremity supported Sitting balance-Leahy Scale: Fair     Standing  balance support: Bilateral upper extremity supported Standing balance-Leahy Scale: Fair                              Cognition Arousal/Alertness: Awake/alert Behavior During Therapy: WFL for tasks assessed/performed Overall Cognitive Status: Within Functional Limits for tasks assessed                                        Exercises General Exercises - Lower Extremity Ankle Circles/Pumps: AROM;Both;15 reps;Seated Long Arc Quad: AROM;Strengthening;Both;15 reps;Seated Heel Slides: AROM;Strengthening;Both;10 reps;Supine Hip ABduction/ADduction: AROM;Strengthening;Both;10 reps;Seated    General Comments General comments (skin integrity, edema, etc.): pt on 3 liters 02, this is her baseline      Pertinent Vitals/Pain Pain Assessment: No/denies pain    Home Living Family/patient expects to be discharged to:: Private residence Living Arrangements: Alone Available Help at Discharge: Family         Home Equipment: Bedside commode;Shower seat      Prior Function Level of Independence: Independent with assistive device(s)      Comments: using RW   PT Goals (current goals can now be found in the care plan section) Acute Rehab PT Goals Patient Stated Goal: home soon Progress towards PT goals: Progressing toward goals    Frequency    Min 3X/week      PT Plan Current plan remains appropriate    Co-evaluation              AM-PAC PT "6 Clicks" Mobility   Outcome Measure  Help needed turning from your back to your side while in a flat bed without using bedrails?: A Little Help needed moving from lying on your back to sitting on the side of a flat bed without using bedrails?: A Little Help needed moving to and from a bed to a chair (including a wheelchair)?: A Little Help needed standing up from a chair using your arms (e.g., wheelchair or bedside chair)?: A Little Help needed to walk in hospital room?: A Little Help needed climbing 3-5  steps with a railing? : A Little 6 Click Score: 18    End of Session Equipment Utilized During Treatment: Gait belt;Oxygen Activity Tolerance: Patient tolerated treatment well Patient left: in bed;with family/visitor present;with call bell/phone within reach;with bed alarm set Nurse Communication: Mobility status PT Visit Diagnosis: Unsteadiness on feet (R26.81);Muscle weakness (generalized) (M62.81)     Time: 1440-1510 PT Time Calculation (min) (ACUTE ONLY): 30 min  Charges:  $Gait Training: 8-22 mins $Therapeutic Exercise: 8-22 mins                     Theodoro Grist, PT   Lelon Mast 12/25/2018, 3:14 PM

## 2018-12-25 NOTE — Care Management Important Message (Signed)
Important Message  Patient Details IM Letter given to Dessa Phi RN to present to the Patient Name: [name] MRN: [id number] Date of Birth: 1930-11-06   Medicare Important Message Given:  Yes    Kerin Salen 12/25/2018, 10:24 AM

## 2018-12-25 NOTE — TOC Progression Note (Signed)
Transition of Care [facility]) - Progression Note    Patient Details  Name: [name] MRN: [id number] Date of Birth: Dec 03, 1930  Transition of Care [facility]) CM/SW Contact  Tiant Peixoto, Juliann Pulse, RN Phone Number: 12/25/2018, 11:46 AM  Clinical Narrative: Patient states she has $4-10/per script to pay for meds if needed.      Expected Discharge Plan: [place] Barriers to Discharge: Continued Medical Work up  Expected Discharge Plan and Services Expected Discharge Plan: [place]   Discharge Planning Services: CM Consult   Living arrangements for the past 2 months: Single Family Home                                       Social Determinants of Health (SDOH) Interventions    Readmission Risk Interventions No flowsheet data found.

## 2018-12-25 NOTE — TOC Progression Note (Signed)
Transition of Care [facility]) - Progression Note    Patient Details  Name: [name] MRN: [id number] Date of Birth: 1931/01/26  Transition of Care [facility]) CM/SW Contact  Kailani Brass, Juliann Pulse, RN Phone Number: 12/25/2018, 12:15 PM  Clinical Narrative:  Patient has been accepted by [facility] rep Santiago Glad following for HHRN/HHPT if needed please order,& face to face also.     Expected Discharge Plan: [place] Barriers to Discharge: Continued Medical Work up  Expected Discharge Plan and Services Expected Discharge Plan: [place]   Discharge Planning Services: CM Consult   Living arrangements for the past 2 months: Single Family Home                                       Social Determinants of Health (SDOH) Interventions    Readmission Risk Interventions No flowsheet data found.

## 2018-12-25 NOTE — TOC Initial Note (Signed)
Transition of Care [facility]) - Initial/Assessment Note    Patient Details  Name: [name] MRN: [id number] Date of Birth: 09-06-1930  Transition of Care [facility]) CM/SW Contact:    Dessa Phi, RN Phone Number: 12/25/2018, 11:43 AM  Clinical Narrative: Patient from home alone, has children that check on her everyday.Has rw,home 02-[employer], has travel tank. Used [facility] for [place] in past-patient would like to use again-rep Santiago Glad will check to see if can accept. Patient recc for HHPT-recc HHRN-med instrcution,disease mgmnt, & HHPT. Await response from [facility] rep Santiago Glad.Patient states family can transport home.                  Expected Discharge Plan: [place] Barriers to Discharge: Continued Medical Work up   Patient Goals and CMS Choice Patient states their goals for this hospitalization and ongoing recovery are:: go home CMS Medicare.gov Compare Post Acute Care list provided to:: Patient Choice offered to / list presented to : Patient  Expected Discharge Plan and Services Expected Discharge Plan: [place]   Discharge Planning Services: CM Consult   Living arrangements for the past 2 months: Single Family Home                                      Prior Living Arrangements/Services Living arrangements for the past 2 months: Single Family Home Lives with:: Self Patient language and need for interpreter reviewed:: Yes Do you feel safe going back to the place where you live?: Yes      Need for Family Participation in Patient Care: No (Comment) Care giver support system in place?: Yes (comment) Current home services: DME([employer]-home 02;rw) Criminal Activity/Legal Involvement Pertinent to Current Situation/Hospitalization: No - Comment as needed  Activities of Daily Living Home Assistive Devices/Equipment: Dentures (specify type), Eyeglasses, Oxygen, Walker (specify type), Wheelchair(lower partials, front wheel walker, O2 @ 3L) ADL Screening  (condition at time of admission) Patient's cognitive ability adequate to safely complete daily activities?: Yes Is the patient deaf or have difficulty hearing?: No Does the patient have difficulty seeing, even when wearing glasses/contacts?: No Does the patient have difficulty concentrating, remembering, or making decisions?: No Patient able to express need for assistance with ADLs?: Yes Does the patient have difficulty dressing or bathing?: Yes Independently performs ADLs?: No Communication: Needs assistance Is this a change from baseline?: Pre-admission baseline Dressing (OT): Needs assistance Is this a change from baseline?: Pre-admission baseline Grooming: Needs assistance Is this a change from baseline?: Pre-admission baseline Feeding: Independent Bathing: Needs assistance Is this a change from baseline?: Pre-admission baseline Toileting: Needs assistance Is this a change from baseline?: Pre-admission baseline In/Out Bed: Appropriate for developmental age 24 in Home: Independent with device (comment) Does the patient have difficulty walking or climbing stairs?: Yes Weakness of Legs: None Weakness of Arms/Hands: None  Permission Sought/Granted Permission sought to share information with : Case Manager Permission granted to share information with : Yes, Verbal Permission Granted              Emotional Assessment Appearance:: Appears stated age Attitude/Demeanor/Rapport: Gracious Affect (typically observed): Accepting Orientation: : Oriented to Self, Oriented to Place, Oriented to  Time, Oriented to Situation Alcohol / Substance Use: Never Used Psych Involvement: No (comment)  Admission diagnosis:  Bronchiectasis with acute exacerbation ([place]) [J47.1] Hypoxia [R09.02] Cavitary pneumonia [J18.9, J98.4] Patient Active Problem List   Diagnosis Date Noted  .  Bacteremia due to Streptococcus 12/24/2018  . History of Mycobacterium avium intracellulare infection 12/24/2018   . Unspecified severe protein-calorie malnutrition ([place]) 12/24/2018  . Aortic atherosclerosis ([place]) 12/24/2018  . Normocytic anemia 12/24/2018  . Pressure injury of skin 12/23/2018  . Chronic obstructive pulmonary disease ([place])   . Cavitary pneumonia 12/22/2018  . Acute on chronic respiratory failure with hypoxia ([place]) 06/23/2017  . LBBB (left bundle branch block) 06/23/2017  . Hyponatremia 06/23/2017  . Essential hypertension   . COPD with acute exacerbation ([place])   . Depression with anxiety   . Hypothyroidism   . GERD (gastroesophageal reflux disease) 03/30/2013  . HASHIMOTO'S THYROIDITIS 09/10/2007  . Vocal cord paresis 09/10/2007  . Bronchiectasis with acute exacerbation ([place]) 09/10/2007   PCP:  Lujean Amel, MD Pharmacy:   [id number] - [place], [place] - [address] [address] Phone: [phone] Fax: [phone]  [facility] [place] [place] [address] Phone: [phone] Fax: [phone]     Social Determinants of Health (SDOH) Interventions    Readmission Risk Interventions No flowsheet data found.

## 2018-12-25 NOTE — Plan of Care (Signed)

## 2018-12-25 NOTE — Progress Notes (Signed)
PROGRESS NOTE    [name]  ZOX:[id number] DOB: 02-14-1931 DOA: 12/22/2018 PCP: Lujean Amel, MD  Brief Narrative:-year-old with past medical history significant for bronchiectasis, COPD, recurrent pneumonia, hypertension, hypothyroidism, Hashimoto thyroiditis and GERD with vocal cord paralysis who presented to the ER with significant shortness of breath and cough.  In the ER she was noted to be hypoxic.  CT chest showed cavitary pneumonia with multiloculated.  COVID screen negative.  Patient was admitted for IV antibiotics.  She was also found to have strep viridans bacteremia.  ID and pulmonologist has been consulted. AFB was ordered.  Patient has a history of MAC     Assessment & Plan:   Principal Problem:   Cavitary pneumonia Active Problems:   Vocal cord paresis   Bronchiectasis with acute exacerbation (HCC)   GERD (gastroesophageal reflux disease)   Acute on chronic respiratory failure with hypoxia (HCC)   Hyponatremia   Essential hypertension   COPD with acute exacerbation (HCC)   Depression with anxiety   Hypothyroidism   Pressure injury of skin   Chronic obstructive pulmonary disease (HCC)   Bacteremia due to Streptococcus   History of Mycobacterium avium intracellulare infection   Unspecified severe protein-calorie malnutrition (HCC)   Aortic atherosclerosis (HCC)   Normocytic anemia   1-acute on chronic hypoxic respiratory failure secondary to cavitary pneumonia, chronic bronchiectasis: Probably related to aspiration pneumonia, history of vocal cord paralysis. Evaluated by infectious disease and pulmonologist.  They are recommending to check a sputum for AFB pending at this time. SIRS coronavirus 2-. Modified barium evaluation: Consistent with healed dysphagia and mild pharyngeal dysphagia.  Patient recommended dysphagia mechanical soft diet, home medication with pure. Continue with Unasyn.   2-strep viridans bacteremia: Source could be cavitary  pneumonia. Echocardiogram; normal ejection fraction no evidence of endocarditis. Appreciate Dr. Megan Salon evaluation and recommendation. Continue with Unasyn  Hypokalemia hypomagnesemia replace.  Hypothyroidism: Continue with Synthroid  COPD: Stable continue with Pulmicort  Hypertension continue with Norvasc  GERD continue with PPI changed to twice daily  Hyponatremia improved  Stage II sacral pressure injury POA.  Continue with wound care  Constipation continue with MiraLAX and Senokot  Severe protein calorie malnutrition Continue with Ensure  Pressure Injury 12/23/18 Sacrum Mid Stage II -  Partial thickness loss of dermis presenting as a shallow open ulcer with a red, pink wound bed without slough. (Active)  12/23/18 0929  Location: Sacrum  Location Orientation: Mid  Staging: Stage II -  Partial thickness loss of dermis presenting as a shallow open ulcer with a red, pink wound bed without slough.  Wound Description (Comments):   Present on Admission: Yes    Estimated body mass index is 13.64 kg/m as calculated from the following:   Height as of this encounter: _0  (1.6 m).   Weight as of this encounter: 34.9 kg.   DVT prophylaxis: Lovenox Code Status: Full code Family Communication: Will update daughter Disposition Plan: Remain in the hospital for IV antibiotics, awaiting AFB    Consultants:   Pulmonologist  Infectious disease   Procedures: Echo; The left ventricle has normal systolic function, with an ejection fraction of 55-60%. The cavity size was normal. Left ventricular diastolic Doppler parameters are consistent with impaired relaxation.  2. The right ventricle has normal systolic function. The cavity was normal.  3. Trivial pericardial effusion is present.  4. The mitral valve is abnormal. Mild thickening of the mitral valve leaflet.  5. The tricuspid valve is grossly normal.  6. The  aortic valve is tricuspid. Mild calcification of the aortic valve.  Aortic valve regurgitation is trivial by color flow Doppler. No stenosis of the aortic valve.   7. Normal LV systolic function; mild diastolic dysfunction; trace AI; mild MR.   Antimicrobials:   Unasyn   Subjective: Patient still complaining of cough and shortness of breath  Objective: Vitals:   12/25/18 0525 12/25/18 0805 12/25/18 1320 12/25/18 1352  BP: 130/73  (!) 153/70   Pulse: 89  92   Resp: 20  16   Temp: 97.9 F (36.6 C)  98.2 F (36.8 C)   TempSrc: Oral  Oral   SpO2: 100% 96% 96% 99%  Weight:      Height:        Intake/Output Summary (Last 24 hours) at 12/25/2018 1423 Last data filed at 12/24/2018 1957 Gross per 24 hour  Intake 300 ml  Output 900 ml  Net -600 ml   Filed Weights   12/22/18 1904  Weight: 34.9 kg    Examination:  General exam: Appears calm and comfortable  Respiratory system: Clear to auscultation. Respiratory effort normal. Cardiovascular system: S1 & S2 heard, RRR. No JVD, murmurs, rubs, gallops or clicks. No pedal edema. Gastrointestinal system: Abdomen is nondistended, soft and nontender. No organomegaly or masses felt. Normal bowel sounds heard. Central nervous system: Alert and oriented. No focal neurological deficits. Extremities: Symmetric 5 x 5 power. Skin: No rashes, lesions or ulcers Psychiatry: Judgement and insight appear normal. Mood & affect appropriate.     Data Reviewed: I have personally reviewed following labs and imaging studies  CBC: Recent Labs  Lab 12/22/18 1918 12/24/18 0436  WBC 10.5 9.4  NEUTROABS 7.7 6.7  HGB 10.6* 10.4*  HCT 33.3* 33.8*  MCV 88.6 90.9  PLT 178 983   Basic Metabolic Panel: Recent Labs  Lab 12/22/18 2015 12/23/18 0402 12/24/18 0436  NA 133* 136 136  K 3.6 2.8* 3.9  CL 90* 92* 95*  CO2 35* 36* 32  GLUCOSE 139* 120* 98  BUN _0 CREATININE 0.53 0.42* 0.36*  CALCIUM 8.2* 8.1* 7.9*  MG  --  1.5* 2.1   GFR: Estimated Creatinine Clearance: 27.8 mL/min (A) (by C-G formula  based on SCr of 0.36 mg/dL (L)). Liver Function Tests: Recent Labs  Lab 12/23/18 0402  AST 17  ALT 14  ALKPHOS 94  BILITOT 0.3  PROT 6.9  ALBUMIN 2.6*   No results for input(s): LIPASE, AMYLASE in the last 168 hours. No results for input(s): AMMONIA in the last 168 hours. Coagulation Profile: No results for input(s): INR, PROTIME in the last 168 hours. Cardiac Enzymes: Recent Labs  Lab 12/22/18 1918  TROPONINI <0.03   BNP (last 3 results) No results for input(s): PROBNP in the last 8760 hours. HbA1C: No results for input(s): HGBA1C in the last 72 hours. CBG: No results for input(s): GLUCAP in the last 168 hours. Lipid Profile: No results for input(s): CHOL, HDL, LDLCALC, TRIG, CHOLHDL, LDLDIRECT in the last 72 hours. Thyroid Function Tests: No results for input(s): TSH, T4TOTAL, FREET4, T3FREE, THYROIDAB in the last 72 hours. Anemia Panel: No results for input(s): VITAMINB12, FOLATE, FERRITIN, TIBC, IRON, RETICCTPCT in the last 72 hours. Sepsis Labs: Recent Labs  Lab 12/22/18 1918  LATICACIDVEN 1.1    Recent Results (from the past 240 hour(s))  SARS Coronavirus 2 (CEPHEID - Performed in [facility] hospital lab), Hosp Order     Status: None   Collection Time: 12/22/18  7:18 PM  Specimen: Nasopharyngeal Swab  Result Value Ref Range Status   SARS Coronavirus 2 NEGATIVE NEGATIVE Final    Comment: (NOTE) If result is NEGATIVE SARS-CoV-2 target nucleic acids are NOT DETECTED. The SARS-CoV-2 RNA is generally detectable in upper and lower  respiratory specimens during the acute phase of infection. The lowest  concentration of SARS-CoV-2 viral copies this assay can detect is 250  copies / mL. A negative result does not preclude SARS-CoV-2 infection  and should not be used as the sole basis for treatment or other  patient management decisions.  A negative result may occur with  improper specimen collection / handling, submission of specimen other  than nasopharyngeal  swab, presence of viral mutation(s) within the  areas targeted by this assay, and inadequate number of viral copies  (<250 copies / mL). A negative result must be combined with clinical  observations, patient history, and epidemiological information. If result is POSITIVE SARS-CoV-2 target nucleic acids are DETECTED. The SARS-CoV-2 RNA is generally detectable in upper and lower  respiratory specimens dur ing the acute phase of infection.  Positive  results are indicative of active infection with SARS-CoV-2.  Clinical  correlation with patient history and other diagnostic information is  necessary to determine patient infection status.  Positive results do  not rule out bacterial infection or co-infection with other viruses. If result is PRESUMPTIVE POSTIVE SARS-CoV-2 nucleic acids MAY BE PRESENT.   A presumptive positive result was obtained on the submitted specimen  and confirmed on repeat testing.  While 2019 novel coronavirus  (SARS-CoV-2) nucleic acids may be present in the submitted sample  additional confirmatory testing may be necessary for epidemiological  and / or clinical management purposes  to differentiate between  SARS-CoV-2 and other Sarbecovirus currently known to infect humans.  If clinically indicated additional testing with an alternate test  methodology [phone]) is advised. The SARS-CoV-2 RNA is generally  detectable in upper and lower respiratory sp ecimens during the acute  phase of infection. The expected result is Negative. Fact Sheet for Patients:  StrictlyIdeas.no Fact Sheet for Healthcare Providers: BankingDealers.co.za This test is not yet approved or cleared by the [place] FDA and has been authorized for detection and/or diagnosis of SARS-CoV-2 by FDA under an Emergency Use Authorization (EUA).  This EUA will remain in effect (meaning this test can be used) for the duration of the COVID-19 declaration  under Section 564(b)(1) of the Act, 21 U.S.C. section 360bbb-3(b)(1), unless the authorization is terminated or revoked sooner. Performed at [facility], [place] [address], [place], [place] [address]   Culture, blood (routine x 2) Call MD if unable to obtain prior to antibiotics being given     Status: None (Preliminary result)   Collection Time: 12/22/18 11:46 PM   Specimen: BLOOD  Result Value Ref Range Status   Specimen Description   Final    BLOOD BLOOD RIGHT ARM Performed at [place] [address], [place], [place] [address]    Special Requests   Final    BOTTLES DRAWN AEROBIC AND ANAEROBIC Blood Culture adequate volume Performed at [facility] [address] [place], [place] [address]    Culture   Final    NO GROWTH 2 DAYS Performed at [facility] [address], [place], [place] [address]    Report Status PENDING  Incomplete  Culture, blood (routine x 2) Call MD if unable to obtain prior to antibiotics being given  Status: Abnormal (Preliminary result)   Collection Time: 12/22/18 11:46 PM   Specimen: BLOOD  Result Value Ref Range Status   Specimen Description   Final    BLOOD BLOOD LEFT FOREARM Performed at [facility] [address], [place], [place] [address]    Special Requests   Final    BOTTLES DRAWN AEROBIC AND ANAEROBIC Blood Culture adequate volume Performed at [facility] [address] [place], [place] [address]    Culture  Setup Time   Final    GRAM POSITIVE COCCI IN CHAINS IN PAIRS IN BOTH AEROBIC AND ANAEROBIC BOTTLES CRITICAL RESULT CALLED TO, READ BACK BY AND VERIFIED WITH: Colin Rhein PHARMD 2233 12/23/18 A BROWNING    Culture (A)  Final    VIRIDANS STREPTOCOCCUS STAPHYLOCOCCUS SPECIES (COAGULASE NEGATIVE) THE SIGNIFICANCE OF ISOLATING THIS ORGANISM FROM A SINGLE SET OF BLOOD CULTURES WHEN MULTIPLE SETS ARE DRAWN IS UNCERTAIN. PLEASE NOTIFY  THE MICROBIOLOGY DEPARTMENT WITHIN ONE WEEK IF SPECIATION AND SENSITIVITIES ARE REQUIRED. Performed at [facility], [place] [address] [place], [place] [address]    Report Status PENDING  Incomplete  Blood Culture ID Panel (Reflexed)     Status: Abnormal   Collection Time: 12/22/18 11:46 PM  Result Value Ref Range Status   Enterococcus species NOT DETECTED NOT DETECTED Final   Listeria monocytogenes NOT DETECTED NOT DETECTED Final   Staphylococcus species NOT DETECTED NOT DETECTED Final   Staphylococcus aureus (BCID) NOT DETECTED NOT DETECTED Final   Streptococcus species DETECTED (A) NOT DETECTED Final    Comment: Not Enterococcus species, Streptococcus agalactiae, Streptococcus pyogenes, or Streptococcus pneumoniae. CRITICAL RESULT CALLED TO, READ BACK BY AND VERIFIED WITH: Colin Rhein PHARMD 2233 12/23/18 A BROWNING    Streptococcus agalactiae NOT DETECTED NOT DETECTED Final   Streptococcus pneumoniae NOT DETECTED NOT DETECTED Final   Streptococcus pyogenes NOT DETECTED NOT DETECTED Final   Acinetobacter baumannii NOT DETECTED NOT DETECTED Final   Enterobacteriaceae species NOT DETECTED NOT DETECTED Final   Enterobacter cloacae complex NOT DETECTED NOT DETECTED Final   Escherichia coli NOT DETECTED NOT DETECTED Final   Klebsiella oxytoca NOT DETECTED NOT DETECTED Final   Klebsiella pneumoniae NOT DETECTED NOT DETECTED Final   Proteus species NOT DETECTED NOT DETECTED Final   Serratia marcescens NOT DETECTED NOT DETECTED Final   Haemophilus influenzae NOT DETECTED NOT DETECTED Final   Neisseria meningitidis NOT DETECTED NOT DETECTED Final   Pseudomonas aeruginosa NOT DETECTED NOT DETECTED Final   Candida albicans NOT DETECTED NOT DETECTED Final   Candida glabrata NOT DETECTED NOT DETECTED Final   Candida krusei NOT DETECTED NOT DETECTED Final   Candida parapsilosis NOT DETECTED NOT DETECTED Final   Candida tropicalis NOT DETECTED NOT DETECTED Final    Comment: Performed at [facility], [place]. [address] [place], [place] [address]  Culture, sputum-assessment     Status: None   Collection Time: 12/23/18 12:51 AM   Specimen: Expectorated Sputum  Result Value Ref Range Status   Specimen Description EXPECTORATED SPUTUM  Final   Special Requests NONE  Final   Sputum evaluation   Final    THIS SPECIMEN IS ACCEPTABLE FOR SPUTUM CULTURE Performed at [facility], [place] [address], [place],  [address]    Report Status 12/23/2018 FINAL  Final  Culture, respiratory     Status: None   Collection Time: 12/23/18 12:51 AM  Result Value Ref Range Status   Specimen Description   Final  EXPECTORATED SPUTUM Performed at [facility], [place] [address], [place], [place] [address]    Special Requests   Final    NONE Reflexed from [phone] Performed at [place] [address] [place], [place] [address]    Gram Stain   Final    MODERATE WBC PRESENT,BOTH PMN AND MONONUCLEAR FEW GRAM POSITIVE COCCI IN PAIRS RARE GRAM POSITIVE RODS    Culture   Final    MODERATE Consistent with normal respiratory flora. Performed at [facility], [place] [address] [place], [place] [address]    Report Status 12/25/2018 FINAL  Final  Expectorated sputum assessment w rflx to resp cult     Status: None   Collection Time: 12/23/18 11:31 AM   Specimen: Sputum  Result Value Ref Range Status   Specimen Description SPUTUM  Final   Special Requests NONE  Final   Sputum evaluation   Final    THIS SPECIMEN IS ACCEPTABLE FOR SPUTUM CULTURE Performed at [facility], [place] [address] [place], [place] [address]    Report Status 12/23/2018 FINAL  Final  Culture, respiratory     Status: None (Preliminary result)   Collection Time: 12/23/18 11:31 AM   Specimen: SPU  Result Value Ref Range Status   Specimen Description   Final    SPUTUM Performed at [place] [address],  [place], [place] [address]    Special Requests   Final    NONE Reflexed from [id number] Performed at [facility], [place] [address], [place], [place] [address]    Gram Stain   Final    ABUNDANT WBC PRESENT,BOTH PMN AND MONONUCLEAR MODERATE GRAM POSITIVE COCCI FEW GRAM VARIABLE ROD RARE YEAST    Culture   Final    CULTURE REINCUBATED FOR BETTER GROWTH Performed at [facility], [place] [address] [place],  [address]    Report Status PENDING  Incomplete         Radiology Studies: Dg Swallowing Func-speech Pathology  Result Date: 12/24/2018 Objective Swallowing Evaluation: Type of Study: MBS-Modified Barium Swallow Study  Patient Details Name: ANALAYA HOEY MRN: [id number] Date of Birth: 1931-06-16 Today's Date: 12/24/2018 Time: SLP Start Time (ACUTE ONLY): 32 -SLP Stop Time (ACUTE ONLY): 1440 SLP Time Calculation (min) (ACUTE ONLY): 35 min Past Medical History: Past Medical History: Diagnosis Date  Bronchiectasis   COPD (chronic obstructive pulmonary disease) ([place])   Depression with anxiety   Essential hypertension   GERD (gastroesophageal reflux disease)   Hashimoto's thyroiditis   Hypothyroidism   Vocal cord paralysis  Past Surgical History: Past Surgical History: Procedure Laterality Date  APPENDECTOMY    HEMORRHOID SURGERY    hysteroscopy x 2    THYROIDECTOMY    TUBAL LIGATION   HPI: 85 yo female adm to [facility] with pna after daughter noted she has AMS via phone.  Pt found to have a cavitary lesion.  PMH + for COPD, chronic aspiration, GERD, recurrent pna, depression anxiety, Hashimoto's s/p thyroidectomy with subsequent vocal cord paralysis.  She has previously seen ENT Dr Janace Hoard who offered to stent her vocal fold but she declined per Dr Janee Morn notes from approximately 2015.  Pt admits she has dysphagia for several years causing her to "choke" - meaning "cough" during and after intake. She admits coughing extensively that she refluxed and aspirated prior to  admission.  She does take Prilosec and thus reports no burning with refluxing.  Pt admits to "  choking" on food and drink - mostly food but also water.  She reports taking medicine with applesauce as if she does not she may choke.  Pt's prior imaging studies have shown patulous thoracic esophagus with fluid line concerning for esophageal dysmotility and/or reflux.  Subjective: pt awake in chair Assessment / Plan / Recommendation CHL IP CLINICAL IMPRESSIONS 12/24/2018 Clinical Impression Patient presents with mild pharyngeal - and suspected primary esophageal dysphagia.  She demonstrates decreased tongue base retraction which results in pharyngeal residuals.  In addition, very TRACE aspiration of thin mixed with secretions observed due to decreased laryngeal closure.  Chin tuck posture nor head turn left nor right helpful to prevent laryngeal penetration or residuals.  And chin tuck posture WORSENED airway protection as it allowed spillage of barium into open larynx from pyriform sinuses.  Pt reflexive dry swallows and liquid swallows decrease residuals in pharynx.  However, pt does not consistently sense pharyngeal residuals thus recommend strict aspiration precautions including following solids with liquids.  Pt able to "hock" and expectorate viscous pudding barium from vallecular region.  Trace laryngeal penetration with nectar was squeezed out with the same swallow.  Suspect pt's primary aspiration risk is esophageal however mitigation strategies in place for pharyngeal deficits also. SLP Visit Diagnosis Dysphagia, pharyngoesophageal phase (R13.14);Dysphagia, unspecified (R13.10) Attention and concentration deficit following -- Frontal lobe and executive function deficit following -- Impact on safety and function Moderate aspiration risk   CHL IP TREATMENT RECOMMENDATION 12/24/2018 Treatment Recommendations Therapy as outlined in treatment plan below   Prognosis 12/24/2018 Prognosis for Safe Diet Advancement Fair  Barriers to Reach Goals Time post onset;Severity of deficits Barriers/Prognosis Comment -- CHL IP DIET RECOMMENDATION 12/24/2018 SLP Diet Recommendations Thin liquid;Dysphagia 3 (Mech soft) solids Liquid Administration via Cup;Straw Medication Administration Whole meds with puree Compensations Slow rate;Small sips/bites;Follow solids with liquid Postural Changes Seated upright at 90 degrees;Remain semi-upright after after feeds/meals (Comment)   CHL IP OTHER RECOMMENDATIONS 12/24/2018 Recommended Consults -- Oral Care Recommendations Oral care BID Other Recommendations --   CHL IP FOLLOW UP RECOMMENDATIONS 12/24/2018 Follow up Recommendations Home health SLP   CHL IP FREQUENCY AND DURATION 12/24/2018 Speech Therapy Frequency (ACUTE ONLY) min 1 x/week Treatment Duration 1 week      CHL IP ORAL PHASE 12/24/2018 Oral Phase Impaired Oral - Pudding Teaspoon -- Oral - Pudding Cup -- Oral - Honey Teaspoon -- Oral - Honey Cup -- Oral - Nectar Teaspoon -- Oral - Nectar Cup WFL Oral - Nectar Straw -- Oral - Thin Teaspoon WFL;Piecemeal swallowing Oral - Thin Cup Piecemeal swallowing;WFL Oral - Thin Straw WFL;Piecemeal swallowing Oral - Puree -- Oral - Mech Soft -- Oral - Regular WFL Oral - Multi-Consistency -- Oral - Pill Other (Comment) Oral Phase - Comment --  CHL IP PHARYNGEAL PHASE 12/24/2018 Pharyngeal Phase Impaired Pharyngeal- Pudding Teaspoon -- Pharyngeal -- Pharyngeal- Pudding Cup -- Pharyngeal -- Pharyngeal- Honey Teaspoon -- Pharyngeal -- Pharyngeal- Honey Cup -- Pharyngeal -- Pharyngeal- Nectar Teaspoon -- Pharyngeal -- Pharyngeal- Nectar Cup Pharyngeal residue - valleculae;Penetration/Aspiration during swallow;WFL Pharyngeal Material enters airway, remains ABOVE vocal cords then ejected out Pharyngeal- Nectar Straw NT Pharyngeal -- Pharyngeal- Thin Teaspoon Pharyngeal residue - valleculae;Lateral channel residue Pharyngeal Material does not enter airway Pharyngeal- Thin Cup Penetration/Aspiration during swallow;Reduced  laryngeal elevation;Reduced airway/laryngeal closure;Pharyngeal residue - valleculae;Lateral channel residue;Trace aspiration Pharyngeal Material enters airway, CONTACTS cords and not ejected out;Material enters airway, passes BELOW cords without attempt by patient to eject out (silent aspiration);Other (Comment) Pharyngeal- Thin Straw Reduced laryngeal  elevation;Reduced airway/laryngeal closure;Penetration/Aspiration during swallow;Trace aspiration;Pharyngeal residue - valleculae;Lateral channel residue Pharyngeal Material enters airway, passes BELOW cords without attempt by patient to eject out (silent aspiration) Pharyngeal- Puree Delayed swallow initiation-vallecula;Reduced epiglottic inversion;Pharyngeal residue - valleculae Pharyngeal Material does not enter airway Pharyngeal- Mechanical Soft Delayed swallow initiation-vallecula;Reduced epiglottic inversion;Reduced tongue base retraction;Pharyngeal residue - valleculae Pharyngeal Material does not enter airway Pharyngeal- Regular -- Pharyngeal -- Pharyngeal- Multi-consistency -- Pharyngeal -- Pharyngeal- Pill WFL Pharyngeal Material does not enter airway Pharyngeal Comment chin tuck posture nor head turn left nor right helpful to prevent laryngeal penetration or residuals, chin tuck posture WORSENED airway protection as it allowed spillage of barium into open larynx, pt initiated reflexive dry swallows decrease residuals in pharynx, pt does not consistently sense pharyngeal residuals thus cues to "hock" and to follow solids with liquids helpful to expectorate or clear; trace laryngeal penetration with nectar was cleared with same swallow  CHL IP CERVICAL ESOPHAGEAL PHASE 12/24/2018 Cervical Esophageal Phase Impaired Pudding Teaspoon -- Pudding Cup -- Honey Teaspoon -- Honey Cup -- Nectar Teaspoon -- Nectar Cup -- Nectar Straw -- Thin Teaspoon -- Thin Cup -- Thin Straw -- Puree -- Mechanical Soft -- Regular -- Multi-consistency -- Pill -- Cervical Esophageal  Comment Upon esophageal sweep, pt appears with stasis in her esophagus WITHOUT sensation; nectar liquids vs thin did not appear to vary in clearance Luanna Salk, MS [facility] SLP Acute Rehab Services Pager [phone] Office [phone] Macario Golds 12/24/2018, 4:05 PM                   Scheduled Meds:  ALPRAZolam  1 mg Oral Daily   amitriptyline  50 mg Oral QHS   amLODipine  5 mg Oral QPM   budesonide (PULMICORT) nebulizer solution  0.5 mg Nebulization BID   celecoxib  200 mg Oral QPC breakfast   enoxaparin (LOVENOX) injection  20 mg Subcutaneous Q24H   feeding supplement (ENSURE ENLIVE)  237 mL Oral BID BM   fluticasone  2 spray Each Nare Daily   ipratropium-albuterol  3 mL Nebulization TID   levothyroxine  88 mcg Oral QAC breakfast   loratadine  10 mg Oral Daily   multivitamin with minerals  1 tablet Oral Q M,W,F   pantoprazole  40 mg Oral Daily   polyethylene glycol  17 g Oral Daily   senna-docusate  1 tablet Oral BID   sertraline  50 mg Oral Q1200   Continuous Infusions:  ampicillin-sulbactam (UNASYN) IV 3 g (12/25/18 1359)     LOS: 3 days    Time spent: 35 minutes.     Elmarie Shiley, MD [facility] Pager [phone]  If 7PM-7AM, please contact night-coverage [email or web address] Password [facility] 12/25/2018, 2:23 PM

## 2018-12-25 NOTE — Progress Notes (Signed)
NAME:  [name], MRN:  [id number], DOB:  03-Feb-1931, LOS: 3 ADMISSION DATE:  12/22/2018, CONSULTATION DATE:   REFERRING MD:  Grandville Silos , CHIEF COMPLAINT:  Cavitary PNA   Brief History   85 year old female w/ known h/o BTX, vocal cord paresis, aspiration and remote MAC admitted w/ worsening SOB and hypoxia on 6/14 and found to have new cavitary lesions in LUL.   History of present illness    She has a history of bronchiectasis and chronic aspiration due to vocal cord paralysis.  She had surgery in 1978 that left her with vocal cord damage.  There is a history of treatment of MAC in the remote past-I do not have details. She presents with an acute illness-an acute productive cough for 1 to 2 weeks superimposed on a chronic dry cough and increasing dyspnea, she is 3 L oxygen dependent at baseline. Chest x-ray showed increasing opacity in the left upper lobe and CT chest clarified multiloculated thick-walled cavities within the left upper lobe. The groundglass opacities in patchy consolidation noted on her prior CT from 07/2017 had improved.  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 Interval development of multiloculated thick-walled pulmonary cavities within the left upper lobe, measuring up to 4 cm. Findings probably represent cavitary pneumonia. Underlying malignancy cannot be excluded. 2. Partial resolution of ground-glass opacities and patchy consolidations elsewhere throughout the lungs. 3. Stable severe cylindrical and varicoid bronchiectasis throughout the lungs. 4. Interval increased diffuse peribronchial thickening which may represent acute on chronic bronchitis.   Micro Data:  6/15 expectorated sputum: nml flora 6/14 BCX2>>>1/2 strep viridans/ coag neg staph 6/14 COVID -19: neg 6/15: induced sputum and afb>>>  Antimicrobials:  levaquin 6/14>>>  Interim history/subjective:  Breathing better. Still coughing up some yellow sputum  Objective   Blood pressure 130/73, pulse 89, temperature 97.9 F (36.6 C), temperature source Oral, resp. rate 20, height _0  (1.6 m), weight 34.9 kg, SpO2 96 %.        Intake/Output Summary (Last 24 hours) at 12/25/2018 1105 Last data filed at 12/24/2018 1957 Gross per 24 hour  Intake 300 ml  Output 1200 ml  Net -900 ml   Filed Weights   12/22/18 1904  Weight: 34.9 kg    Examination: General: frail cachectic 85 year old wf. Resting in bed. No distress on 3 L nasal cannula HENT: Mild pallor, no icterus Lungs: Decreased breath sounds bilateral, no bronchial breathing Cardiovascular: RRR Abdomen: soft not tender  Extremities: no edema  Neuro: Alert and interactive, nonfocal   Resolved Hospital Problem list     Assessment & Plan:   Acute on chronic resp failure Bronchiectasis Chronic aspiration H/o MAIC (treated?) Vocal cord paresis  COPD Severe GERD Anorexia  Discussion the clinical story and imaging is most consistent with aspiration and cavitary pneumonia in the left upper lobe.  Differential includes reactivation of MAC-but the story of acute illness is more consistent with aspiration.  As such bronchoscopy will be deferred, also note her frail condition and wanting to avoid procedures.  I am not concerned about malignancy here    Plan/rec   Await AFB-but even after this grows MAC again, I do  not think it will change management.  She is not a candidate for prolonged MAC therapy. Aspiration and reflux precautions Would treat with a longer duration of antibiotics 10 to 14 days She can follow-up with Dr. Annamaria Boots for reimaging in 2 weeks  PCCM available as needed   V. Elsworth Soho MD [phone]

## 2018-12-25 NOTE — Progress Notes (Signed)
Patient ID: [name], female   DOB: 01/04/1931, 85 y.o.   MRN: [id number]         [facility] for Infectious Disease  Date of Admission:  12/22/2018   Total days of antibiotics 3        Day 2 ampicillin sulbactam         ASSESSMENT: She seems to be improving on empiric therapy for presumed polymicrobial cavitary pneumonia secondary to chronic aspiration.  1 of 2 admission blood cultures has grown coag negative staph and viridans strep.  There is no evidence of endocarditis by exam or TTE.  I think recurrent Mycobacterium avium pneumonia is much less likely.  I will continue ampicillin sulbactam for now. Once she is a little stronger she can transition to oral amoxicillin clavulanate.  Plan on treating her at least 2 weeks using serial chest x-rays to help determine optimal duration of treatment.  PLAN: 1. Continue ampicillin sulbactam 2. Await results of final sputum cultures and AFB smear and culture  Principal Problem:   Cavitary pneumonia Active Problems:   Bacteremia due to Streptococcus   History of Mycobacterium avium intracellulare infection   Vocal cord paresis   Bronchiectasis with acute exacerbation (HCC)   GERD (gastroesophageal reflux disease)   Acute on chronic respiratory failure with hypoxia (HCC)   Hyponatremia   Essential hypertension   COPD with acute exacerbation (HCC)   Depression with anxiety   Hypothyroidism   Pressure injury of skin   Chronic obstructive pulmonary disease (HCC)   Unspecified severe protein-calorie malnutrition (HCC)   Aortic atherosclerosis (HCC)   Normocytic anemia   Scheduled Meds: . ALPRAZolam  1 mg Oral Daily  . amitriptyline  50 mg Oral QHS  . amLODipine  5 mg Oral QPM  . budesonide (PULMICORT) nebulizer solution  0.5 mg Nebulization BID  . celecoxib  200 mg Oral QPC breakfast  . enoxaparin (LOVENOX) injection  20 mg Subcutaneous Q24H  . feeding supplement (ENSURE ENLIVE)  237 mL Oral BID BM  . fluticasone  2 spray  Each Nare Daily  . ipratropium-albuterol  3 mL Nebulization TID  . levothyroxine  88 mcg Oral QAC breakfast  . loratadine  10 mg Oral Daily  . multivitamin with minerals  1 tablet Oral Q M,W,F  . pantoprazole  40 mg Oral BID  . polyethylene glycol  17 g Oral Daily  . senna-docusate  1 tablet Oral BID  . sertraline  50 mg Oral Q1200   Continuous Infusions: . ampicillin-sulbactam (UNASYN) IV 3 g (12/25/18 1359)   PRN Meds:.ALPRAZolam, guaiFENesin   SUBJECTIVE: She says that she is definitely feeling better today.  She is not coughing as much but is still bringing up some thick brown sputum.  Review of Systems: Review of Systems  Constitutional: Positive for malaise/fatigue and weight loss. Negative for chills, diaphoresis and fever.  Respiratory: Positive for cough, sputum production, shortness of breath and wheezing. Negative for hemoptysis.   Cardiovascular: Negative for chest pain.    Allergies  Allergen Reactions  . Ambien [Zolpidem]     Foggy mind-gets up and walks around (pt does not remember anything of the event)  . Asa [Aspirin] Other (See Comments)    Spits up blood  . Boniva [Ibandronic Acid] Other (See Comments)    GI upset  . Cystex [Germanium] Nausea Only  . Doxycycline Nausea Only and Hypertension    OBJECTIVE: Vitals:   12/25/18 1320 12/25/18 1352 12/25/18 1508 12/25/18 1517  BP: (!) 153/70  Pulse: 92     Resp: 16     Temp: 98.2 F (36.8 C)     TempSrc: Oral     SpO2: 96% 99% (!) 88% 93%  Weight:      Height:       Body mass index is 13.64 kg/m.  Physical Exam Constitutional:      Comments: She is in good spirits today and is very talkative.  Cardiovascular:     Rate and Rhythm: Normal rate and regular rhythm.     Heart sounds: No gallop.   Pulmonary:     Effort: Pulmonary effort is normal.     Breath sounds: No wheezing or rales.     Comments: She is using supplemental nasal prong oxygen.  Prominent crackles throughout all lung fields  with occasional wheezes noted.    Lab Results Lab Results  Component Value Date   WBC 9.4 12/24/2018   HGB 10.4 (L) 12/24/2018   HCT 33.8 (L) 12/24/2018   MCV 90.9 12/24/2018   PLT 293 12/24/2018    Lab Results  Component Value Date   CREATININE 0.36 (L) 12/24/2018   BUN 10 12/24/2018   NA 136 12/24/2018   K 3.9 12/24/2018   CL 95 (L) 12/24/2018   CO2 32 12/24/2018    Lab Results  Component Value Date   ALT 14 12/23/2018   AST 17 12/23/2018   ALKPHOS 94 12/23/2018   BILITOT 0.3 12/23/2018     Microbiology: Recent Results (from the past 240 hour(s))  SARS Coronavirus 2 (CEPHEID - Performed in [place] hospital lab), Hosp Order     Status: None   Collection Time: 12/22/18  7:18 PM   Specimen: Nasopharyngeal Swab  Result Value Ref Range Status   SARS Coronavirus 2 NEGATIVE NEGATIVE Final    Comment: (NOTE) If result is NEGATIVE SARS-CoV-2 target nucleic acids are NOT DETECTED. The SARS-CoV-2 RNA is generally detectable in upper and lower  respiratory specimens during the acute phase of infection. The lowest  concentration of SARS-CoV-2 viral copies this assay can detect is 250  copies / mL. A negative result does not preclude SARS-CoV-2 infection  and should not be used as the sole basis for treatment or other  patient management decisions.  A negative result may occur with  improper specimen collection / handling, submission of specimen other  than nasopharyngeal swab, presence of viral mutation(s) within the  areas targeted by this assay, and inadequate number of viral copies  (<250 copies / mL). A negative result must be combined with clinical  observations, patient history, and epidemiological information. If result is POSITIVE SARS-CoV-2 target nucleic acids are DETECTED. The SARS-CoV-2 RNA is generally detectable in upper and lower  respiratory specimens dur ing the acute phase of infection.  Positive  results are indicative of active infection with  SARS-CoV-2.  Clinical  correlation with patient history and other diagnostic information is  necessary to determine patient infection status.  Positive results do  not rule out bacterial infection or co-infection with other viruses. If result is PRESUMPTIVE POSTIVE SARS-CoV-2 nucleic acids MAY BE PRESENT.   A presumptive positive result was obtained on the submitted specimen  and confirmed on repeat testing.  While 2019 novel coronavirus  (SARS-CoV-2) nucleic acids may be present in the submitted sample  additional confirmatory testing may be necessary for epidemiological  and / or clinical management purposes  to differentiate between  SARS-CoV-2 and other Sarbecovirus currently known to infect humans.  If clinically  indicated additional testing with an alternate test  methodology [id number]) is advised. The SARS-CoV-2 RNA is generally  detectable in upper and lower respiratory sp ecimens during the acute  phase of infection. The expected result is Negative. Fact Sheet for Patients:  StrictlyIdeas.no Fact Sheet for Healthcare Providers: BankingDealers.co.za This test is not yet approved or cleared by the [place] FDA and has been authorized for detection and/or diagnosis of SARS-CoV-2 by FDA under an Emergency Use Authorization (EUA).  This EUA will remain in effect (meaning this test can be used) for the duration of the COVID-19 declaration under Section 564(b)(1) of the Act, 21 U.S.C. section 360bbb-3(b)(1), unless the authorization is terminated or revoked sooner. Performed at [facility], [place] [address], [place], [place] [address]   Culture, blood (routine x 2) Call MD if unable to obtain prior to antibiotics being given     Status: None (Preliminary result)   Collection Time: 12/22/18 11:46 PM   Specimen: BLOOD  Result Value Ref Range Status   Specimen Description   Final    BLOOD BLOOD RIGHT ARM Performed  at [facility] [address], [place], [place] [address]    Special Requests   Final    BOTTLES DRAWN AEROBIC AND ANAEROBIC Blood Culture adequate volume Performed at [facility] [address] [place], [place] [address]    Culture   Final    NO GROWTH 2 DAYS Performed at [facility] [address] [place], [place] [address]    Report Status PENDING  Incomplete  Culture, blood (routine x 2) Call MD if unable to obtain prior to antibiotics being given     Status: Abnormal (Preliminary result)   Collection Time: 12/22/18 11:46 PM   Specimen: BLOOD  Result Value Ref Range Status   Specimen Description   Final    BLOOD BLOOD LEFT FOREARM Performed at [facility], [place] [address], [place], [place] [address]    Special Requests   Final    BOTTLES DRAWN AEROBIC AND ANAEROBIC Blood Culture adequate volume Performed at [facility] [address], [place], [place] [address]    Culture  Setup Time   Final    GRAM POSITIVE COCCI IN CHAINS IN PAIRS IN BOTH AEROBIC AND ANAEROBIC BOTTLES CRITICAL RESULT CALLED TO, READ BACK BY AND VERIFIED WITH: Colin Rhein PHARMD [id number] 12/23/18 A BROWNING    Culture (A)  Final    VIRIDANS STREPTOCOCCUS STAPHYLOCOCCUS SPECIES (COAGULASE NEGATIVE) THE SIGNIFICANCE OF ISOLATING THIS ORGANISM FROM A SINGLE SET OF BLOOD CULTURES WHEN MULTIPLE SETS ARE DRAWN IS UNCERTAIN. PLEASE NOTIFY THE MICROBIOLOGY DEPARTMENT WITHIN ONE WEEK IF SPECIATION AND SENSITIVITIES ARE REQUIRED. Performed at [facility], [place] [address] [place], [place] [address]    Report Status PENDING  Incomplete  Blood Culture ID Panel (Reflexed)     Status: Abnormal   Collection Time: 12/22/18 11:46 PM  Result Value Ref Range Status   Enterococcus species NOT DETECTED NOT DETECTED Final   Listeria monocytogenes NOT DETECTED NOT DETECTED Final   Staphylococcus species NOT DETECTED NOT DETECTED Final    Staphylococcus aureus (BCID) NOT DETECTED NOT DETECTED Final   Streptococcus species DETECTED (A) NOT DETECTED Final    Comment: Not Enterococcus species, Streptococcus agalactiae, Streptococcus pyogenes, or Streptococcus pneumoniae. CRITICAL RESULT CALLED TO, READ BACK BY AND VERIFIED WITH: Colin Rhein PHARMD [id number] 12/23/18 A BROWNING    Streptococcus agalactiae NOT DETECTED NOT DETECTED Final  Streptococcus pneumoniae NOT DETECTED NOT DETECTED Final   Streptococcus pyogenes NOT DETECTED NOT DETECTED Final   Acinetobacter baumannii NOT DETECTED NOT DETECTED Final   Enterobacteriaceae species NOT DETECTED NOT DETECTED Final   Enterobacter cloacae complex NOT DETECTED NOT DETECTED Final   Escherichia coli NOT DETECTED NOT DETECTED Final   Klebsiella oxytoca NOT DETECTED NOT DETECTED Final   Klebsiella pneumoniae NOT DETECTED NOT DETECTED Final   Proteus species NOT DETECTED NOT DETECTED Final   Serratia marcescens NOT DETECTED NOT DETECTED Final   Haemophilus influenzae NOT DETECTED NOT DETECTED Final   Neisseria meningitidis NOT DETECTED NOT DETECTED Final   Pseudomonas aeruginosa NOT DETECTED NOT DETECTED Final   Candida albicans NOT DETECTED NOT DETECTED Final   Candida glabrata NOT DETECTED NOT DETECTED Final   Candida krusei NOT DETECTED NOT DETECTED Final   Candida parapsilosis NOT DETECTED NOT DETECTED Final   Candida tropicalis NOT DETECTED NOT DETECTED Final    Comment: Performed at [facility], [place] [address] [place], [place] [address]  Culture, sputum-assessment     Status: None   Collection Time: 12/23/18 12:51 AM   Specimen: Expectorated Sputum  Result Value Ref Range Status   Specimen Description EXPECTORATED SPUTUM  Final   Special Requests NONE  Final   Sputum evaluation   Final    THIS SPECIMEN IS ACCEPTABLE FOR SPUTUM CULTURE Performed at [facility], [place] [address] [place], [place] [address]    Report Status 12/23/2018 FINAL  Final   Culture, respiratory     Status: None   Collection Time: 12/23/18 12:51 AM  Result Value Ref Range Status   Specimen Description   Final    EXPECTORATED SPUTUM Performed at [facility] [address], [place], [place] [address]    Special Requests   Final    NONE Reflexed from [id number] Performed at [facility] [address] [place], [place] [address]    Gram Stain   Final    MODERATE WBC PRESENT,BOTH PMN AND MONONUCLEAR FEW GRAM POSITIVE COCCI IN PAIRS RARE GRAM POSITIVE RODS    Culture   Final    MODERATE Consistent with normal respiratory flora. Performed at [facility], [place] [address] [place], [place] [address]    Report Status 12/25/2018 FINAL  Final  Expectorated sputum assessment w rflx to resp cult     Status: None   Collection Time: 12/23/18 11:31 AM   Specimen: Sputum  Result Value Ref Range Status   Specimen Description SPUTUM  Final   Special Requests NONE  Final   Sputum evaluation   Final    THIS SPECIMEN IS ACCEPTABLE FOR SPUTUM CULTURE Performed at [facility], [place] [address] [place], [place] [address]    Report Status 12/23/2018 FINAL  Final  Culture, respiratory     Status: None (Preliminary result)   Collection Time: 12/23/18 11:31 AM   Specimen: SPU  Result Value Ref Range Status   Specimen Description   Final    SPUTUM Performed at [facility] [address], [place],  [address]    Special Requests   Final    NONE Reflexed from [id number] Performed at [facility], [place] [address], [place], [place] [address]    Gram Stain   Final    ABUNDANT WBC PRESENT,BOTH PMN AND MONONUCLEAR MODERATE GRAM POSITIVE COCCI FEW GRAM VARIABLE ROD RARE YEAST    Culture   Final  CULTURE REINCUBATED FOR BETTER GROWTH Performed at [facility], [place] [address], [place], [place] [address]    Report Status PENDING  Incomplete    Michel Bickers, MD  [facility] for Infectious [place] [phone] pager   [phone] cell 12/25/2018, 3:52 PM

## 2018-12-25 NOTE — Progress Notes (Signed)
Due to her low wt, we will change her DVT px lovenox to 20mg  qday. Ok per Dr. Dennison Nancy.   Onnie Boer, PharmD, BCIDP, AAHIVP, CPP Infectious Disease Pharmacist 12/25/2018 11:17 AM

## 2018-12-25 NOTE — Evaluation (Signed)
Occupational Therapy Evaluation Patient Details Name: [name] MRN: [id number] DOB: 28-Apr-1931 Today's Date: 12/25/2018    History of Present Illness 85 yo female admitted with cavitary pna. Hx of COPD-O2 dep, recurrent pna, chronic aspiration, vocal cord paralysis, anorexia, Hashimoto's thyroiditis   Clinical Impression   Pt was admitted for the above.  At baseline, 2 daughters have been taking turns staying with her and helping as needed. Pt currently needs min guard for safety.  Will follow in acute setting with supervision to mod I level goals     Follow Up Recommendations  Home health OT;Supervision - Intermittent    Equipment Recommendations  None recommended by OT    Recommendations for Other Services       Precautions / Restrictions Precautions Precautions: Fall Precaution Comments: O2 dep Restrictions Weight Bearing Restrictions: No      Mobility Bed Mobility         Supine to sit: Supervision Sit to supine: Supervision      Transfers   Equipment used: Rolling walker (2 wheeled)   Sit to Stand: Min guard         General transfer comment: for safety    Balance                                           ADL either performed or assessed with clinical judgement   ADL Overall ADL's : Needs assistance/impaired             Lower Body Bathing: Min guard;Sit to/from stand       Lower Body Dressing: Min guard;Sit to/from stand                 General ADL Comments: pt is very flexible.  Sat EOB for assessment and performed AROM exercises. Did not want to get up to chair yet     Vision         Perception     Praxis      Pertinent Vitals/Pain Pain Assessment: No/denies pain     Hand Dominance     Extremity/Trunk Assessment Upper Extremity Assessment Upper Extremity Assessment: Generalized weakness           Communication Communication Communication: No difficulties   Cognition  Arousal/Alertness: Awake/alert Behavior During Therapy: WFL for tasks assessed/performed Overall Cognitive Status: Within Functional Limits for tasks assessed                                     General Comments  pt on 3 liters 02, this is her baseline    Exercises     Shoulder Instructions      Home Living Family/patient expects to be discharged to:: Private residence Living Arrangements: Alone Available Help at Discharge: Family               Bathroom Shower/Tub: Walk-in Corporate treasurer Toilet: Standard     Home Equipment: Bedside commode;Shower seat          Prior Functioning/Environment Level of Independence: Independent with assistive device(s)        Comments: using RW        OT Problem List: Decreased strength;Decreased activity tolerance;Impaired balance (sitting and/or standing)      OT Treatment/Interventions: Self-care/ADL training;Energy conservation;DME and/or AE instruction;Patient/family education;Balance training;Therapeutic activities  OT Goals(Current goals can be found in the care plan section) Acute Rehab OT Goals Patient Stated Goal: home soon OT Goal Formulation: With patient Time For Goal Achievement: 01/08/19 Potential to Achieve Goals: Good ADL Goals Pt Will Perform Grooming: with modified independence;standing Pt Will Transfer to Toilet: with modified independence;ambulating;bedside commode Pt Will Perform Toileting - Clothing Manipulation and hygiene: with modified independence;sit to/from stand Additional ADL Goal #1: pt will perform adl with set up only  OT Frequency: Min 2X/week   Barriers to D/C:            Co-evaluation              AM-PAC OT "6 Clicks" Daily Activity     Outcome Measure Help from another person eating meals?: None Help from another person taking care of personal grooming?: A Little Help from another person toileting, which includes using toliet, bedpan, or urinal?: A  Little Help from another person bathing (including washing, rinsing, drying)?: A Little Help from another person to put on and taking off regular upper body clothing?: A Little Help from another person to put on and taking off regular lower body clothing?: A Little 6 Click Score: 19   End of Session    Activity Tolerance: Patient tolerated treatment well Patient left: in bed;with call bell/phone within reach;with bed alarm set  OT Visit Diagnosis: Muscle weakness (generalized) (M62.81)                Time: 5830-9407 OT Time Calculation (min): 19 min Charges:  OT General Charges $OT Visit: 1 Visit OT Evaluation $OT Eval Low Complexity: [place], OTR/L Acute Rehabilitation Services [phone] [facility] pager [phone] office 12/25/2018  [place] 12/25/2018, 12:10 PM

## 2018-12-25 NOTE — TOC Progression Note (Signed)
Transition of Care [facility]) - Progression Note    Patient Details  Name: [name] MRN: [id number] Date of Birth: 03/04/1931  Transition of Care [facility]) CM/SW Contact  Lamone Ferrelli, Juliann Pulse, RN Phone Number: 12/25/2018, 10:38 AM  Clinical Narrative:   [place] [phone]  [place] my Favorites Quality of Patient Care Rating 4 out of 5 stars Patient Survey Summary Rating 4 out of [place] [phone]  [place] my Favorites Quality of Patient Care Rating 3 out of 5 stars Patient Survey Summary Rating 4 out of 5 stars [place] [phone]  Add [facility] my Favorites Quality of Patient Care Rating 4  out of 5 stars Patient Survey Summary Rating 3 out of 5 stars [place] [phone]  [place] [employer] my Favorites Quality of Patient Care Rating 4  out of 5 stars Patient Survey Summary Rating 4 out of 5 stars [place] [phone]  [place], [employer] my Favorites Quality of Patient Care Rating 4 out of 5 stars Patient Survey Summary Rating 4 out of 5 stars [place] [phone]  [place] my Favorites Quality of Patient Care Rating 4 out of 5 stars Patient Survey Summary Rating 4 out of 5 stars [facility] [place] [phone]  Add [facility] [place] my Favorites Quality of Patient Care Rating 3  out of 5 stars Patient Survey Summary Rating 4 out of 5 stars [place] [phone]  [place] my Favorites Quality of Patient Care Rating 3 out of 5 stars Patient Survey Summary Rating 4 out of 5 stars [place] [phone]  [place] my Favorites Quality of Patient Care Rating 2  out of 5 stars Patient Survey Summary  Rating 3 out of 5 stars [facility] [phone]  Add [facility] my Favorites Quality of Patient Care Rating 4 out of 5 stars Not [place] [phone]  [place] my Favorites Quality of Patient Care Rating 3 out of 5 stars Patient Survey Summary Rating 4 out of 5 stars [place] [phone]  Add [facility] my Favorites Not Available5 Not Available12 [facility] [phone]  Add [facility] my Favorites Quality of Patient Care Rating 3  out of 5 stars Patient Survey Summary Rating 3 out of 5 stars [place] [phone]  [place] my Favorites Quality of Patient Care Rating 5 out of 5 stars Patient Survey Summary Rating 4 out of 5 stars [place] [phone]  [place] my Favorites Quality of Patient Care Rating 3  out of 5 stars Patient Survey Summary Rating 4 out of 5 stars [place] [phone]  Add [facility] Quality of Patient Care Rating 3  out of 5 stars Patient Survey Summary Rating 3 out of 5 stars [place] [phone]  Add [place] my Favorites Quality of Patient Care Rating 3  out of 5 stars Not [place] [phone]           Expected Discharge  Plan and Services                                                 Social Determinants of Health (SDOH) Interventions    Readmission Risk Interventions No flowsheet data found.

## 2018-12-26 LAB — CULTURE, BLOOD (ROUTINE X 2): Special Requests: ADEQUATE

## 2018-12-26 LAB — BASIC METABOLIC PANEL
Anion gap: 10 (ref 5–15)
BUN: 12 mg/dL (ref 8–23)
CO2: 37 mmol/L — ABNORMAL HIGH (ref 22–32)
Calcium: 8.5 mg/dL — ABNORMAL LOW (ref 8.9–10.3)
Chloride: 89 mmol/L — ABNORMAL LOW (ref 98–111)
Creatinine, Ser: 0.42 mg/dL — ABNORMAL LOW (ref 0.44–1.00)
GFR calc Af Amer: >60 mL/min (ref >60)
GFR calc non Af Amer: >60 mL/min (ref >60)
Glucose, Bld: 103 mg/dL — ABNORMAL HIGH (ref 70–99)
Potassium: 4.1 mmol/L (ref 3.5–5.1)
Sodium: 136 mmol/L (ref 135–145)

## 2018-12-26 LAB — CBC
HCT: 35.4 % — ABNORMAL LOW (ref 36.0–46.0)
Hemoglobin: 11 g/dL — ABNORMAL LOW (ref 12.0–15.0)
MCH: 27.8 pg (ref 26.0–34.0)
MCHC: 31.1 g/dL (ref 30.0–36.0)
MCV: 89.6 fL (ref 80.0–100.0)
Platelets: 330 10*3/uL (ref 150–400)
RBC: 3.95 MIL/uL (ref 3.87–5.11)
RDW: 14 % (ref 11.5–15.5)
WBC: 9.3 10*3/uL (ref 4.0–10.5)
nRBC: 0 % (ref 0.0–0.2)

## 2018-12-26 LAB — CULTURE, RESPIRATORY W GRAM STAIN: Culture: NORMAL

## 2018-12-26 LAB — ACID FAST SMEAR (AFB, MYCOBACTERIA): Acid Fast Smear: NEGATIVE

## 2018-12-26 NOTE — Progress Notes (Signed)
Patient ID: [name], female   DOB: 03-Jul-1931, 85 y.o.   MRN: [id number]         [facility] for Infectious Disease  Date of Admission:  12/22/2018   Total days of antibiotics 4        Day 3 ampicillin sulbactam         ASSESSMENT: She seems to be improving on empiric therapy for presumed polymicrobial cavitary pneumonia secondary to chronic aspiration complicated by transient [place] streptococcal bacteremia.  PLAN: 1. Continue ampicillin sulbactam 2. Await results AFB smear and culture  Principal Problem:   Cavitary pneumonia Active Problems:   Bacteremia due to Streptococcus   History of Mycobacterium avium intracellulare infection   Vocal cord paresis   Bronchiectasis with acute exacerbation (HCC)   GERD (gastroesophageal reflux disease)   Acute on chronic respiratory failure with hypoxia (HCC)   Hyponatremia   Essential hypertension   COPD with acute exacerbation (HCC)   Depression with anxiety   Hypothyroidism   Pressure injury of skin   Chronic obstructive pulmonary disease (HCC)   Unspecified severe protein-calorie malnutrition (HCC)   Aortic atherosclerosis (HCC)   Normocytic anemia   Scheduled Meds: . ALPRAZolam  1 mg Oral Daily  . amitriptyline  50 mg Oral QHS  . amLODipine  5 mg Oral QPM  . budesonide (PULMICORT) nebulizer solution  0.5 mg Nebulization BID  . celecoxib  200 mg Oral QPC breakfast  . enoxaparin (LOVENOX) injection  20 mg Subcutaneous Q24H  . feeding supplement (ENSURE ENLIVE)  237 mL Oral BID BM  . fluticasone  2 spray Each Nare Daily  . ipratropium-albuterol  3 mL Nebulization TID  . levothyroxine  88 mcg Oral QAC breakfast  . loratadine  10 mg Oral Daily  . multivitamin with minerals  1 tablet Oral Q M,W,F  . pantoprazole  40 mg Oral BID  . polyethylene glycol  17 g Oral Daily  . senna-docusate  1 tablet Oral BID  . sertraline  50 mg Oral Q1200   Continuous Infusions: . ampicillin-sulbactam (UNASYN) IV 3 g (12/26/18 0917)    PRN Meds:.ALPRAZolam, guaiFENesin   SUBJECTIVE: She says that she was having quite a bit of coughing overnight but is feeling better this morning.  Review of Systems: Review of Systems  Constitutional: Positive for malaise/fatigue and weight loss. Negative for chills, diaphoresis and fever.  Respiratory: Positive for cough, sputum production, shortness of breath and wheezing. Negative for hemoptysis.   Cardiovascular: Negative for chest pain.    Allergies  Allergen Reactions  . Ambien [Zolpidem]     Foggy mind-gets up and walks around (pt does not remember anything of the event)  . Asa [Aspirin] Other (See Comments)    Spits up blood  . Boniva [Ibandronic Acid] Other (See Comments)    GI upset  . Cystex [Germanium] Nausea Only  . Doxycycline Nausea Only and Hypertension    OBJECTIVE: Vitals:   12/25/18 2006 12/25/18 2054 12/26/18 0420 12/26/18 0831  BP:  (!) 119/52 121/63   Pulse:  88 87   Resp:  14 (!) 24   Temp:  98.2 F (36.8 C) 98 F (36.7 C)   TempSrc:  Oral Oral   SpO2: 100% 97% 100% 95%  Weight:      Height:       Body mass index is 13.64 kg/m.  Physical Exam Constitutional:      Comments: She is sitting up eating breakfast.  Cardiovascular:     Rate and  Rhythm: Normal rate and regular rhythm.     Heart sounds: No gallop.   Pulmonary:     Effort: Pulmonary effort is normal.     Breath sounds: No wheezing or rales.     Comments: Prominent crackles throughout all lung fields are unchanged.    Lab Results Lab Results  Component Value Date   WBC 9.3 12/26/2018   HGB 11.0 (L) 12/26/2018   HCT 35.4 (L) 12/26/2018   MCV 89.6 12/26/2018   PLT 330 12/26/2018    Lab Results  Component Value Date   CREATININE 0.42 (L) 12/26/2018   BUN 12 12/26/2018   NA 136 12/26/2018   K 4.1 12/26/2018   CL 89 (L) 12/26/2018   CO2 37 (H) 12/26/2018    Lab Results  Component Value Date   ALT 14 12/23/2018   AST 17 12/23/2018   ALKPHOS 94 12/23/2018    BILITOT 0.3 12/23/2018     Microbiology: Recent Results (from the past 240 hour(s))  SARS Coronavirus 2 (CEPHEID - Performed in [facility] hospital lab), Hosp Order     Status: None   Collection Time: 12/22/18  7:18 PM   Specimen: Nasopharyngeal Swab  Result Value Ref Range Status   SARS Coronavirus 2 NEGATIVE NEGATIVE Final    Comment: (NOTE) If result is NEGATIVE SARS-CoV-2 target nucleic acids are NOT DETECTED. The SARS-CoV-2 RNA is generally detectable in upper and lower  respiratory specimens during the acute phase of infection. The lowest  concentration of SARS-CoV-2 viral copies this assay can detect is 250  copies / mL. A negative result does not preclude SARS-CoV-2 infection  and should not be used as the sole basis for treatment or other  patient management decisions.  A negative result may occur with  improper specimen collection / handling, submission of specimen other  than nasopharyngeal swab, presence of viral mutation(s) within the  areas targeted by this assay, and inadequate number of viral copies  (<250 copies / mL). A negative result must be combined with clinical  observations, patient history, and epidemiological information. If result is POSITIVE SARS-CoV-2 target nucleic acids are DETECTED. The SARS-CoV-2 RNA is generally detectable in upper and lower  respiratory specimens dur ing the acute phase of infection.  Positive  results are indicative of active infection with SARS-CoV-2.  Clinical  correlation with patient history and other diagnostic information is  necessary to determine patient infection status.  Positive results do  not rule out bacterial infection or co-infection with other viruses. If result is PRESUMPTIVE POSTIVE SARS-CoV-2 nucleic acids MAY BE PRESENT.   A presumptive positive result was obtained on the submitted specimen  and confirmed on repeat testing.  While 2019 novel coronavirus  (SARS-CoV-2) nucleic acids may be present in the  submitted sample  additional confirmatory testing may be necessary for epidemiological  and / or clinical management purposes  to differentiate between  SARS-CoV-2 and other Sarbecovirus currently known to infect humans.  If clinically indicated additional testing with an alternate test  methodology [phone]) is advised. The SARS-CoV-2 RNA is generally  detectable in upper and lower respiratory sp ecimens during the acute  phase of infection. The expected result is Negative. Fact Sheet for Patients:  StrictlyIdeas.no Fact Sheet for Healthcare Providers: BankingDealers.co.za This test is not yet approved or cleared by the [place] FDA and has been authorized for detection and/or diagnosis of SARS-CoV-2 by FDA under an Emergency Use Authorization (EUA).  This EUA will remain in effect (meaning this  test can be used) for the duration of the COVID-19 declaration under Section 564(b)(1) of the Act, 21 U.S.C. section 360bbb-3(b)(1), unless the authorization is terminated or revoked sooner. Performed at [facility], [place] [address], [place], [place] [address]   Culture, blood (routine x 2) Call MD if unable to obtain prior to antibiotics being given     Status: None (Preliminary result)   Collection Time: 12/22/18 11:46 PM   Specimen: BLOOD  Result Value Ref Range Status   Specimen Description   Final    BLOOD BLOOD RIGHT ARM Performed at [facility] [place] [address], [place], [place] [address]    Special Requests   Final    BOTTLES DRAWN AEROBIC AND ANAEROBIC Blood Culture adequate volume Performed at [facility] [address], [place], [place] [address]    Culture   Final    NO GROWTH 2 DAYS Performed at [place] [address] [place], [place] [address]    Report Status PENDING  Incomplete  Culture, blood (routine x 2) Call MD if unable to obtain prior to  antibiotics being given     Status: Abnormal   Collection Time: 12/22/18 11:46 PM   Specimen: BLOOD  Result Value Ref Range Status   Specimen Description   Final    BLOOD BLOOD LEFT FOREARM Performed at [place] [address] [place], [place] [address]    Special Requests   Final    BOTTLES DRAWN AEROBIC AND ANAEROBIC Blood Culture adequate volume Performed at [facility] [address] [place], [place] [address]    Culture  Setup Time   Final    GRAM POSITIVE COCCI IN CHAINS IN PAIRS IN BOTH AEROBIC AND ANAEROBIC BOTTLES CRITICAL RESULT CALLED TO, READ BACK BY AND VERIFIED WITH: Colin Rhein PHARMD 2233 12/23/18 A BROWNING    Culture (A)  Final    VIRIDANS STREPTOCOCCUS STAPHYLOCOCCUS SPECIES (COAGULASE NEGATIVE) THE SIGNIFICANCE OF ISOLATING THIS ORGANISM FROM A SINGLE SET OF BLOOD CULTURES WHEN MULTIPLE SETS ARE DRAWN IS UNCERTAIN. PLEASE NOTIFY THE MICROBIOLOGY DEPARTMENT WITHIN ONE WEEK IF SPECIATION AND SENSITIVITIES ARE REQUIRED. Performed at [facility], [place] [address], [place], [place] [address]    Report Status 12/26/2018 FINAL  Final  Blood Culture ID Panel (Reflexed)     Status: Abnormal   Collection Time: 12/22/18 11:46 PM  Result Value Ref Range Status   Enterococcus species NOT DETECTED NOT DETECTED Final   Listeria monocytogenes NOT DETECTED NOT DETECTED Final   Staphylococcus species NOT DETECTED NOT DETECTED Final   Staphylococcus aureus (BCID) NOT DETECTED NOT DETECTED Final   Streptococcus species DETECTED (A) NOT DETECTED Final    Comment: Not Enterococcus species, Streptococcus agalactiae, Streptococcus pyogenes, or Streptococcus pneumoniae. CRITICAL RESULT CALLED TO, READ BACK BY AND VERIFIED WITH: Colin Rhein PHARMD 2233 12/23/18 A BROWNING    Streptococcus agalactiae NOT DETECTED NOT DETECTED Final   Streptococcus pneumoniae NOT DETECTED NOT DETECTED Final   Streptococcus pyogenes NOT DETECTED NOT DETECTED Final    Acinetobacter baumannii NOT DETECTED NOT DETECTED Final   Enterobacteriaceae species NOT DETECTED NOT DETECTED Final   Enterobacter cloacae complex NOT DETECTED NOT DETECTED Final   Escherichia coli NOT DETECTED NOT DETECTED Final   Klebsiella oxytoca NOT DETECTED NOT DETECTED Final   Klebsiella pneumoniae NOT DETECTED NOT DETECTED Final   Proteus species NOT DETECTED NOT DETECTED Final   Serratia marcescens NOT DETECTED NOT DETECTED Final  Haemophilus influenzae NOT DETECTED NOT DETECTED Final   Neisseria meningitidis NOT DETECTED NOT DETECTED Final   Pseudomonas aeruginosa NOT DETECTED NOT DETECTED Final   Candida albicans NOT DETECTED NOT DETECTED Final   Candida glabrata NOT DETECTED NOT DETECTED Final   Candida krusei NOT DETECTED NOT DETECTED Final   Candida parapsilosis NOT DETECTED NOT DETECTED Final   Candida tropicalis NOT DETECTED NOT DETECTED Final    Comment: Performed at [facility], [place] [address], [place], [place] [address]  Culture, sputum-assessment     Status: None   Collection Time: 12/23/18 12:51 AM   Specimen: Expectorated Sputum  Result Value Ref Range Status   Specimen Description EXPECTORATED SPUTUM  Final   Special Requests NONE  Final   Sputum evaluation   Final    THIS SPECIMEN IS ACCEPTABLE FOR SPUTUM CULTURE Performed at [facility], [place] [address] [place], [place] [address]    Report Status 12/23/2018 FINAL  Final  Culture, respiratory     Status: None   Collection Time: 12/23/18 12:51 AM  Result Value Ref Range Status   Specimen Description   Final    EXPECTORATED SPUTUM Performed at [place] [address] [place], [place] [address]    Special Requests   Final    NONE Reflexed from [phone] Performed at [facility] [address], [place], [place] [address]    Gram Stain   Final    MODERATE WBC PRESENT,BOTH PMN AND MONONUCLEAR FEW GRAM POSITIVE COCCI IN PAIRS RARE GRAM  POSITIVE RODS    Culture   Final    MODERATE Consistent with normal respiratory flora. Performed at [facility], [place] [address] [place], [place] [address]    Report Status 12/25/2018 FINAL  Final  Expectorated sputum assessment w rflx to resp cult     Status: None   Collection Time: 12/23/18 11:31 AM   Specimen: Sputum  Result Value Ref Range Status   Specimen Description SPUTUM  Final   Special Requests NONE  Final   Sputum evaluation   Final    THIS SPECIMEN IS ACCEPTABLE FOR SPUTUM CULTURE Performed at [facility], [place] [address], [place], [place] [address]    Report Status 12/23/2018 FINAL  Final  Culture, respiratory     Status: None   Collection Time: 12/23/18 11:31 AM   Specimen: SPU  Result Value Ref Range Status   Specimen Description   Final    SPUTUM Performed at [place] [address] [place], [place] [address]    Special Requests   Final    NONE Reflexed from [id number] Performed at [facility], [place] [address] [place], [place] [address]    Gram Stain   Final    ABUNDANT WBC PRESENT,BOTH PMN AND MONONUCLEAR MODERATE GRAM POSITIVE COCCI FEW GRAM VARIABLE ROD RARE YEAST    Culture   Final    MODERATE Consistent with normal respiratory flora. Performed at [facility], [place] [address], [place], [place] [address]    Report Status 12/26/2018 FINAL  Final    Michel Bickers, MD [place] for Infectious [place] [phone] pager   [phone] cell 12/26/2018, 9:59 AM

## 2018-12-26 NOTE — Progress Notes (Signed)
Initial Nutrition Assessment  DOCUMENTATION CODES:   Severe malnutrition in context of chronic illness, Underweight  INTERVENTION:  - continue Ensure Enlive po BID, each supplement provides 350 kcal and 20 grams of protein - will order Magic Cup BID with meals, each supplement provides 290 kcal and 9 grams of protein. - continue to encourage PO intakes.    NUTRITION DIAGNOSIS:   Severe Malnutrition related to chronic illness as evidenced by severe fat depletion, severe muscle depletion.  GOAL:   Patient will meet greater than or equal to 90% of their needs, Weight gain  MONITOR:   PO intake, Supplement acceptance, Labs, Weight trends, Skin  REASON FOR ASSESSMENT:   Other (Comment)(BMI; Pressure Injury report)  ASSESSMENT:   85 year-old female with medical history significant of bronchiectasis, COPD, recurrent PNA, HTN, hypothyroidism, Hashimoto's thyroiditis, GERD, and vocal cord paralysis. She presented to the ED with SOB and cough. She has been taking medications at home without relief. CXR and CT chest done in the ED and showed cavitary PNA; suspected aspiration pneumonitis with abscesses.  Per RN flow sheet, patient consumed 55% of breakfast, 40% of lunch, and 100% of dinner on 6/15 (total of 1187 kcal, 63 grams of protein) and 100% of breakfast and 75% of lunch and dinner on 6/16 (total of 1539 kcal, 84 grams of protein). Patient reports she never has had a big appetite, but that appetite was decreased from usual for the few weeks PTA. She states that since admission and treatment appetite has been returning to baseline. Ensure Enlive was ordered BID on 6/16. Patient refused both bottles on that date but accepted both bottles yesterday and one this AM.   Per chart review, current weight is 77 lb and weight on 06/04/18 was 83 lb. This indicates 6 lb weight loss (7% body weight) in the past 7 months; not significant for time frame. Patient does not feel that weight has changed at  all recently.  Per notes: acute on chronic respiratory failure 2/2 PNA, chronic bronchiectasis, strep bacteremia, hypokalemia and hypomagnesemia--resolved, stable COPD, severe PCM with Ensure ordered.    Medications reviewed; 88 mcg oral synthroid/day, daily multivitamin with minerals, 17 g miralax/day, 1 tablet senokot BID.  Labs reviewed; Cl: 89 mmol/l, creatinine: 0.42 mg/dl, Ca: 8.5 mg/dl.     NUTRITION - FOCUSED PHYSICAL EXAM:    Most Recent Value  Orbital Region  Moderate depletion  Upper Arm Region  Severe depletion  Thoracic and Lumbar Region  Unable to assess  Buccal Region  Severe depletion  Temple Region  Moderate depletion  Clavicle Bone Region  Severe depletion  Clavicle and Acromion Bone Region  Severe depletion  Scapular Bone Region  Unable to assess  Dorsal Hand  Moderate depletion  Patellar Region  Severe depletion  Anterior Thigh Region  Unable to assess  Posterior Calf Region  Severe depletion  Edema (RD Assessment)  None  Hair  Reviewed  Eyes  Reviewed  Mouth  Reviewed  Skin  Reviewed  Nails  Reviewed       Diet Order:   Diet Order            DIET DYS 3 Room service appropriate? Yes; Fluid consistency: Thin  Diet effective now              EDUCATION NEEDS:   No education needs have been identified at this time  Skin:  Skin Assessment: Skin Integrity Issues: Skin Integrity Issues:: Stage II Stage II: sacrum  Last BM:  6/17  Height:   Ht Readings from Last 1 Encounters:  12/22/18 5\' 3"  (1.6 m)    Weight:   Wt Readings from Last 1 Encounters:  12/22/18 34.9 kg    Ideal Body Weight:  52.3 kg  BMI:  Body mass index is 13.64 kg/m.  Estimated Nutritional Needs:   Kcal:  1400-1600 kcal  Protein:  60-70 grams  Fluid:  >/= 1.6 L/day     Jarome Matin, MS, RD, LDN, [facility] Inpatient Clinical Dietitian Pager # [phone] After hours/weekend pager # [phone]

## 2018-12-26 NOTE — Progress Notes (Signed)
OT Cancellation Note  Patient Details Name: [name] MRN: [id number] DOB: [birth year]   Cancelled Treatment:    Reason Eval/Treat Not Completed: Fatigue/lethargy limiting ability to participate. Pt resting in bed; too tired to participate in OT. Will try to come back tomorrow, as schedule permits.  [place] 12/26/2018, 3:23 PM  Lesle Chris, OTR/L Acute Rehabilitation Services [phone] [facility] pager [phone] office 12/26/2018

## 2018-12-26 NOTE — Progress Notes (Signed)
PROGRESS NOTE    [name]  XTA:[id number] DOB: May 20, 1931 DOA: 12/22/2018 PCP: Lujean Amel, MD  Brief Narrative:-year-old with past medical history significant for bronchiectasis, COPD, recurrent pneumonia, hypertension, hypothyroidism, Hashimoto thyroiditis and GERD with vocal cord paralysis who presented to the ER with significant shortness of breath and cough.  In the ER she was noted to be hypoxic.  CT chest showed cavitary pneumonia with multiloculated.  COVID screen negative.  Patient was admitted for IV antibiotics.  She was also found to have strep viridans bacteremia.  ID and pulmonologist has been consulted. AFB was ordered.  Patient has a history of MAC     Assessment & Plan:   Principal Problem:   Cavitary pneumonia Active Problems:   Vocal cord paresis   Bronchiectasis with acute exacerbation (HCC)   GERD (gastroesophageal reflux disease)   Acute on chronic respiratory failure with hypoxia (HCC)   Hyponatremia   Essential hypertension   COPD with acute exacerbation (HCC)   Depression with anxiety   Hypothyroidism   Pressure injury of skin   Chronic obstructive pulmonary disease (HCC)   Bacteremia due to Streptococcus   History of Mycobacterium avium intracellulare infection   Unspecified severe protein-calorie malnutrition (HCC)   Aortic atherosclerosis (HCC)   Normocytic anemia   1-Acute on chronic hypoxic respiratory failure secondary to cavitary pneumonia, chronic bronchiectasis: Probably related to aspiration pneumonia, history of vocal cord paralysis. Evaluated by infectious disease and pulmonologist.  They are recommending to check a sputum for AFB pending at this time. SIRS coronavirus 2-. Modified barium evaluation: Consistent with healed dysphagia and mild pharyngeal dysphagia.  Patient recommended dysphagia mechanical soft diet, home medication with pure. Continue with Unasyn. Appreciate Dr Megan Salon help. Continue with IV antibiotics until  patient is stronger. Discharge on amoxicillin clavulanate. Needs treatment for at least 2 weeks and serial chest x ray to determine optimal duration treatment.    2-Strep viridans bacteremia: Source could be cavitary pneumonia. Echocardiogram; normal ejection fraction no evidence of endocarditis. Appreciate Dr. Megan Salon evaluation and recommendation. Continue with Unasyn  Hypokalemia hypomagnesemia replace.  Hypothyroidism: Continue with Synthroid  COPD: Stable continue with Pulmicort  Hypertension continue with Norvasc  GERD continue with PPI changed to twice daily  Hyponatremia improved  Stage II sacral pressure injury POA.  Continue with wound care  Constipation continue with MiraLAX and Senokot  Severe protein calorie malnutrition Continue with Ensure  Pressure Injury 12/23/18 Sacrum Mid Stage II -  Partial thickness loss of dermis presenting as a shallow open ulcer with a red, pink wound bed without slough. (Active)  12/23/18 0929  Location: Sacrum  Location Orientation: Mid  Staging: Stage II -  Partial thickness loss of dermis presenting as a shallow open ulcer with a red, pink wound bed without slough.  Wound Description (Comments):   Present on Admission: Yes    Estimated body mass index is 13.64 kg/m as calculated from the following:   Height as of this encounter: _0  (1.6 m).   Weight as of this encounter: 34.9 kg.   DVT prophylaxis: Lovenox Code Status: Full code Family Communication: Will update daughter Disposition Plan: Remain in the hospital for IV antibiotics, awaiting AFB    Consultants:   Pulmonologist  Infectious disease   Procedures: Echo; The left ventricle has normal systolic function, with an ejection fraction of 55-60%. The cavity size was normal. Left ventricular diastolic Doppler parameters are consistent with impaired relaxation.  2. The right ventricle has normal systolic function. The cavity  was normal.  3. Trivial pericardial  effusion is present.  4. The mitral valve is abnormal. Mild thickening of the mitral valve leaflet.  5. The tricuspid valve is grossly normal.  6. The aortic valve is tricuspid. Mild calcification of the aortic valve. Aortic valve regurgitation is trivial by color flow Doppler. No stenosis of the aortic valve.   7. Normal LV systolic function; mild diastolic dysfunction; trace AI; mild MR.   Antimicrobials:   Unasyn   Subjective: She wake up, answer questions. Report feeling improved, still weak, dyspnea improved not at baseline.   Objective: Vitals:   12/25/18 2006 12/25/18 2054 12/26/18 0420 12/26/18 0831  BP:  (!) 119/52 121/63   Pulse:  88 87   Resp:  14 (!) 24   Temp:  98.2 F (36.8 C) 98 F (36.7 C)   TempSrc:  Oral Oral   SpO2: 100% 97% 100% 95%  Weight:      Height:        Intake/Output Summary (Last 24 hours) at 12/26/2018 1054 Last data filed at 12/26/2018 0500 Gross per 24 hour  Intake 250 ml  Output 800 ml  Net -550 ml   Filed Weights   12/22/18 1904  Weight: 34.9 kg    Examination:  General exam: NAD Respiratory system: ronchus left side Cardiovascular system: S 1, S 2 RRR Gastrointestinal system: BS present, soft, nt Central nervous system: non focal.  Extremities: symmetric power.  Skin: no rashes Psychiatry: alert and oriented.   Data Reviewed: I have personally reviewed following labs and imaging studies  CBC: Recent Labs  Lab 12/22/18 1918 12/24/18 0436 12/26/18 0803  WBC 10.5 9.4 9.3  NEUTROABS 7.7 6.7  --   HGB 10.6* 10.4* 11.0*  HCT 33.3* 33.8* 35.4*  MCV 88.6 90.9 89.6  PLT 178 293 607   Basic Metabolic Panel: Recent Labs  Lab 12/22/18 2015 12/23/18 0402 12/24/18 0436 12/26/18 0803  NA 133* 136 136 136  K 3.6 2.8* 3.9 4.1  CL 90* 92* 95* 89*  CO2 35* 36* 32 37*  GLUCOSE 139* 120* 98 103*  BUN _0 CREATININE 0.53 0.42* 0.36* 0.42*  CALCIUM 8.2* 8.1* 7.9* 8.5*  MG  --  1.5* 2.1  --    GFR: Estimated  Creatinine Clearance: 27.8 mL/min (A) (by C-G formula based on SCr of 0.42 mg/dL (L)). Liver Function Tests: Recent Labs  Lab 12/23/18 0402  AST 17  ALT 14  ALKPHOS 94  BILITOT 0.3  PROT 6.9  ALBUMIN 2.6*   No results for input(s): LIPASE, AMYLASE in the last 168 hours. No results for input(s): AMMONIA in the last 168 hours. Coagulation Profile: No results for input(s): INR, PROTIME in the last 168 hours. Cardiac Enzymes: Recent Labs  Lab 12/22/18 1918  TROPONINI <0.03   BNP (last 3 results) No results for input(s): PROBNP in the last 8760 hours. HbA1C: No results for input(s): HGBA1C in the last 72 hours. CBG: No results for input(s): GLUCAP in the last 168 hours. Lipid Profile: No results for input(s): CHOL, HDL, LDLCALC, TRIG, CHOLHDL, LDLDIRECT in the last 72 hours. Thyroid Function Tests: No results for input(s): TSH, T4TOTAL, FREET4, T3FREE, THYROIDAB in the last 72 hours. Anemia Panel: No results for input(s): VITAMINB12, FOLATE, FERRITIN, TIBC, IRON, RETICCTPCT in the last 72 hours. Sepsis Labs: Recent Labs  Lab 12/22/18 1918  LATICACIDVEN 1.1    Recent Results (from the past 240 hour(s))  SARS Coronavirus 2 (CEPHEID - Performed in  [facility] hospital lab), Hosp Order     Status: None   Collection Time: 12/22/18  7:18 PM   Specimen: Nasopharyngeal Swab  Result Value Ref Range Status   SARS Coronavirus 2 NEGATIVE NEGATIVE Final    Comment: (NOTE) If result is NEGATIVE SARS-CoV-2 target nucleic acids are NOT DETECTED. The SARS-CoV-2 RNA is generally detectable in upper and lower  respiratory specimens during the acute phase of infection. The lowest  concentration of SARS-CoV-2 viral copies this assay can detect is 250  copies / mL. A negative result does not preclude SARS-CoV-2 infection  and should not be used as the sole basis for treatment or other  patient management decisions.  A negative result may occur with  improper specimen collection /  handling, submission of specimen other  than nasopharyngeal swab, presence of viral mutation(s) within the  areas targeted by this assay, and inadequate number of viral copies  (<250 copies / mL). A negative result must be combined with clinical  observations, patient history, and epidemiological information. If result is POSITIVE SARS-CoV-2 target nucleic acids are DETECTED. The SARS-CoV-2 RNA is generally detectable in upper and lower  respiratory specimens dur ing the acute phase of infection.  Positive  results are indicative of active infection with SARS-CoV-2.  Clinical  correlation with patient history and other diagnostic information is  necessary to determine patient infection status.  Positive results do  not rule out bacterial infection or co-infection with other viruses. If result is PRESUMPTIVE POSTIVE SARS-CoV-2 nucleic acids MAY BE PRESENT.   A presumptive positive result was obtained on the submitted specimen  and confirmed on repeat testing.  While 2019 novel coronavirus  (SARS-CoV-2) nucleic acids may be present in the submitted sample  additional confirmatory testing may be necessary for epidemiological  and / or clinical management purposes  to differentiate between  SARS-CoV-2 and other Sarbecovirus currently known to infect humans.  If clinically indicated additional testing with an alternate test  methodology [phone]) is advised. The SARS-CoV-2 RNA is generally  detectable in upper and lower respiratory sp ecimens during the acute  phase of infection. The expected result is Negative. Fact Sheet for Patients:  StrictlyIdeas.no Fact Sheet for Healthcare Providers: BankingDealers.co.za This test is not yet approved or cleared by the [place] FDA and has been authorized for detection and/or diagnosis of SARS-CoV-2 by FDA under an Emergency Use Authorization (EUA).  This EUA will remain in effect (meaning this  test can be used) for the duration of the COVID-19 declaration under Section 564(b)(1) of the Act, 21 U.S.C. section 360bbb-3(b)(1), unless the authorization is terminated or revoked sooner. Performed at [facility], [place] [address] [place], [place] [address]   Culture, blood (routine x 2) Call MD if unable to obtain prior to antibiotics being given     Status: None (Preliminary result)   Collection Time: 12/22/18 11:46 PM   Specimen: BLOOD  Result Value Ref Range Status   Specimen Description   Final    BLOOD BLOOD RIGHT ARM Performed at [facility] [address], [place], [place] [address]    Special Requests   Final    BOTTLES DRAWN AEROBIC AND ANAEROBIC Blood Culture adequate volume Performed at [facility] [address], [place], [place] [address]    Culture   Final    NO GROWTH 2 DAYS Performed at [facility] [address], [place], [place] [address]    Report Status PENDING  Incomplete  Culture, blood (routine x 2) Call MD if unable to obtain prior to antibiotics being given     Status: Abnormal   Collection Time: 12/22/18 11:46 PM   Specimen: BLOOD  Result Value Ref Range Status   Specimen Description   Final    BLOOD BLOOD LEFT FOREARM Performed at [facility] [address] [place], [place] [address]    Special Requests   Final    BOTTLES DRAWN AEROBIC AND ANAEROBIC Blood Culture adequate volume Performed at [facility] [address] [place], [place] [address]    Culture  Setup Time   Final    GRAM POSITIVE COCCI IN CHAINS IN PAIRS IN BOTH AEROBIC AND ANAEROBIC BOTTLES CRITICAL RESULT CALLED TO, READ BACK BY AND VERIFIED WITH: Colin Rhein PHARMD 2233 12/23/18 A BROWNING    Culture (A)  Final    VIRIDANS STREPTOCOCCUS STAPHYLOCOCCUS SPECIES (COAGULASE NEGATIVE) THE SIGNIFICANCE OF ISOLATING THIS ORGANISM FROM A SINGLE SET OF BLOOD CULTURES WHEN MULTIPLE  SETS ARE DRAWN IS UNCERTAIN. PLEASE NOTIFY THE MICROBIOLOGY DEPARTMENT WITHIN ONE WEEK IF SPECIATION AND SENSITIVITIES ARE REQUIRED. Performed at [facility], [place] [address], [place], [place] [address]    Report Status 12/26/2018 FINAL  Final  Blood Culture ID Panel (Reflexed)     Status: Abnormal   Collection Time: 12/22/18 11:46 PM  Result Value Ref Range Status   Enterococcus species NOT DETECTED NOT DETECTED Final   Listeria monocytogenes NOT DETECTED NOT DETECTED Final   Staphylococcus species NOT DETECTED NOT DETECTED Final   Staphylococcus aureus (BCID) NOT DETECTED NOT DETECTED Final   Streptococcus species DETECTED (A) NOT DETECTED Final    Comment: Not Enterococcus species, Streptococcus agalactiae, Streptococcus pyogenes, or Streptococcus pneumoniae. CRITICAL RESULT CALLED TO, READ BACK BY AND VERIFIED WITH: Colin Rhein PHARMD 2233 12/23/18 A BROWNING    Streptococcus agalactiae NOT DETECTED NOT DETECTED Final   Streptococcus pneumoniae NOT DETECTED NOT DETECTED Final   Streptococcus pyogenes NOT DETECTED NOT DETECTED Final   Acinetobacter baumannii NOT DETECTED NOT DETECTED Final   Enterobacteriaceae species NOT DETECTED NOT DETECTED Final   Enterobacter cloacae complex NOT DETECTED NOT DETECTED Final   Escherichia coli NOT DETECTED NOT DETECTED Final   Klebsiella oxytoca NOT DETECTED NOT DETECTED Final   Klebsiella pneumoniae NOT DETECTED NOT DETECTED Final   Proteus species NOT DETECTED NOT DETECTED Final   Serratia marcescens NOT DETECTED NOT DETECTED Final   Haemophilus influenzae NOT DETECTED NOT DETECTED Final   Neisseria meningitidis NOT DETECTED NOT DETECTED Final   Pseudomonas aeruginosa NOT DETECTED NOT DETECTED Final   Candida albicans NOT DETECTED NOT DETECTED Final   Candida glabrata NOT DETECTED NOT DETECTED Final   Candida krusei NOT DETECTED NOT DETECTED Final   Candida parapsilosis NOT DETECTED NOT DETECTED Final   Candida tropicalis NOT DETECTED NOT  DETECTED Final    Comment: Performed at [facility], [place]. [address] [place], [place] [address]  Culture, sputum-assessment     Status: None   Collection Time: 12/23/18 12:51 AM   Specimen: Expectorated Sputum  Result Value Ref Range Status   Specimen Description EXPECTORATED SPUTUM  Final   Special Requests NONE  Final   Sputum evaluation   Final    THIS SPECIMEN IS ACCEPTABLE FOR SPUTUM CULTURE Performed at [facility], [place] [address] [place], [place] [address]    Report Status 12/23/2018 FINAL  Final  Culture, respiratory     Status: None  Collection Time: 12/23/18 12:51 AM  Result Value Ref Range Status   Specimen Description   Final    EXPECTORATED SPUTUM Performed at [facility] [address] [place], [place] [address]    Special Requests   Final    NONE Reflexed from [phone] [id number] Performed at [facility] [address] [place], [place] [address]    Gram Stain   Final    MODERATE WBC PRESENT,BOTH PMN AND MONONUCLEAR FEW GRAM POSITIVE COCCI IN PAIRS RARE GRAM POSITIVE RODS    Culture   Final    MODERATE Consistent with normal respiratory flora. Performed at [facility], [place] [address], [place], [place] [address]    Report Status 12/25/2018 FINAL  Final  Expectorated sputum assessment w rflx to resp cult     Status: None   Collection Time: 12/23/18 11:31 AM   Specimen: Sputum  Result Value Ref Range Status   Specimen Description SPUTUM  Final   Special Requests NONE  Final   Sputum evaluation   Final    THIS SPECIMEN IS ACCEPTABLE FOR SPUTUM CULTURE Performed at [facility], [place] [address] [place], [place] [address]    Report Status 12/23/2018 FINAL  Final  Culture, respiratory     Status: None   Collection Time: 12/23/18 11:31 AM   Specimen: SPU  Result Value Ref Range Status   Specimen Description   Final    SPUTUM Performed at [facility]  [address] [place], [place] [address]    Special Requests   Final    NONE Reflexed from [id number] Performed at [facility], [place] [address], [place], [place] [address]    Gram Stain   Final    ABUNDANT WBC PRESENT,BOTH PMN AND MONONUCLEAR MODERATE GRAM POSITIVE COCCI FEW GRAM VARIABLE ROD RARE YEAST    Culture   Final    MODERATE Consistent with normal respiratory flora. Performed at [facility], [place] [address], [place], [place] [address]    Report Status 12/26/2018 FINAL  Final         Radiology Studies: Dg Swallowing Func-speech Pathology  Result Date: 12/24/2018 Objective Swallowing Evaluation: Type of Study: MBS-Modified Barium Swallow Study  Patient Details Name: NICHOLETTE DOLSON MRN: [id number] Date of Birth: [birth year] Today's Date: 12/24/2018 Time: SLP Start Time (ACUTE ONLY): 30 -SLP Stop Time (ACUTE ONLY): 1440 SLP Time Calculation (min) (ACUTE ONLY): 35 min Past Medical History: Past Medical History: Diagnosis Date  Bronchiectasis   COPD (chronic obstructive pulmonary disease) ([place])   Depression with anxiety   Essential hypertension   GERD (gastroesophageal reflux disease)   Hashimoto's thyroiditis   Hypothyroidism   Vocal cord paralysis  Past Surgical History: Past Surgical History: Procedure Laterality Date  APPENDECTOMY    HEMORRHOID SURGERY    hysteroscopy x 2    THYROIDECTOMY    TUBAL LIGATION   HPI: 85 yo female adm to [facility] with pna after daughter noted she has AMS via phone.  Pt found to have a cavitary lesion.  PMH + for COPD, chronic aspiration, GERD, recurrent pna, depression anxiety, Hashimoto's s/p thyroidectomy with subsequent vocal cord paralysis.  She has previously seen ENT Dr Janace Hoard who offered to stent her vocal fold but she declined per Dr Janee Morn notes from approximately 2015.  Pt admits she has dysphagia for several years causing her to "choke" - meaning "cough" during and after intake. She admits coughing extensively that she  refluxed  and aspirated prior to admission.  She does take Prilosec and thus reports no burning with refluxing.  Pt admits to "choking" on food and drink - mostly food but also water.  She reports taking medicine with applesauce as if she does not she may choke.  Pt's prior imaging studies have shown patulous thoracic esophagus with fluid line concerning for esophageal dysmotility and/or reflux.  Subjective: pt awake in chair Assessment / Plan / Recommendation CHL IP CLINICAL IMPRESSIONS 12/24/2018 Clinical Impression Patient presents with mild pharyngeal - and suspected primary esophageal dysphagia.  She demonstrates decreased tongue base retraction which results in pharyngeal residuals.  In addition, very TRACE aspiration of thin mixed with secretions observed due to decreased laryngeal closure.  Chin tuck posture nor head turn left nor right helpful to prevent laryngeal penetration or residuals.  And chin tuck posture WORSENED airway protection as it allowed spillage of barium into open larynx from pyriform sinuses.  Pt reflexive dry swallows and liquid swallows decrease residuals in pharynx.  However, pt does not consistently sense pharyngeal residuals thus recommend strict aspiration precautions including following solids with liquids.  Pt able to "hock" and expectorate viscous pudding barium from vallecular region.  Trace laryngeal penetration with nectar was squeezed out with the same swallow.  Suspect pt's primary aspiration risk is esophageal however mitigation strategies in place for pharyngeal deficits also. SLP Visit Diagnosis Dysphagia, pharyngoesophageal phase (R13.14);Dysphagia, unspecified (R13.10) Attention and concentration deficit following -- Frontal lobe and executive function deficit following -- Impact on safety and function Moderate aspiration risk   CHL IP TREATMENT RECOMMENDATION 12/24/2018 Treatment Recommendations Therapy as outlined in treatment plan below   Prognosis 12/24/2018 Prognosis  for Safe Diet Advancement Fair Barriers to Reach Goals Time post onset;Severity of deficits Barriers/Prognosis Comment -- CHL IP DIET RECOMMENDATION 12/24/2018 SLP Diet Recommendations Thin liquid;Dysphagia 3 (Mech soft) solids Liquid Administration via Cup;Straw Medication Administration Whole meds with puree Compensations Slow rate;Small sips/bites;Follow solids with liquid Postural Changes Seated upright at 90 degrees;Remain semi-upright after after feeds/meals (Comment)   CHL IP OTHER RECOMMENDATIONS 12/24/2018 Recommended Consults -- Oral Care Recommendations Oral care BID Other Recommendations --   CHL IP FOLLOW UP RECOMMENDATIONS 12/24/2018 Follow up Recommendations Home health SLP   CHL IP FREQUENCY AND DURATION 12/24/2018 Speech Therapy Frequency (ACUTE ONLY) min 1 x/week Treatment Duration 1 week      CHL IP ORAL PHASE 12/24/2018 Oral Phase Impaired Oral - Pudding Teaspoon -- Oral - Pudding Cup -- Oral - Honey Teaspoon -- Oral - Honey Cup -- Oral - Nectar Teaspoon -- Oral - Nectar Cup WFL Oral - Nectar Straw -- Oral - Thin Teaspoon WFL;Piecemeal swallowing Oral - Thin Cup Piecemeal swallowing;WFL Oral - Thin Straw WFL;Piecemeal swallowing Oral - Puree -- Oral - Mech Soft -- Oral - Regular WFL Oral - Multi-Consistency -- Oral - Pill Other (Comment) Oral Phase - Comment --  CHL IP PHARYNGEAL PHASE 12/24/2018 Pharyngeal Phase Impaired Pharyngeal- Pudding Teaspoon -- Pharyngeal -- Pharyngeal- Pudding Cup -- Pharyngeal -- Pharyngeal- Honey Teaspoon -- Pharyngeal -- Pharyngeal- Honey Cup -- Pharyngeal -- Pharyngeal- Nectar Teaspoon -- Pharyngeal -- Pharyngeal- Nectar Cup Pharyngeal residue - valleculae;Penetration/Aspiration during swallow;WFL Pharyngeal Material enters airway, remains ABOVE vocal cords then ejected out Pharyngeal- Nectar Straw NT Pharyngeal -- Pharyngeal- Thin Teaspoon Pharyngeal residue - valleculae;Lateral channel residue Pharyngeal Material does not enter airway Pharyngeal- Thin Cup  Penetration/Aspiration during swallow;Reduced laryngeal elevation;Reduced airway/laryngeal closure;Pharyngeal residue - valleculae;Lateral channel residue;Trace aspiration Pharyngeal Material enters airway, CONTACTS cords and not ejected  out;Material enters airway, passes BELOW cords without attempt by patient to eject out (silent aspiration);Other (Comment) Pharyngeal- Thin Straw Reduced laryngeal elevation;Reduced airway/laryngeal closure;Penetration/Aspiration during swallow;Trace aspiration;Pharyngeal residue - valleculae;Lateral channel residue Pharyngeal Material enters airway, passes BELOW cords without attempt by patient to eject out (silent aspiration) Pharyngeal- Puree Delayed swallow initiation-vallecula;Reduced epiglottic inversion;Pharyngeal residue - valleculae Pharyngeal Material does not enter airway Pharyngeal- Mechanical Soft Delayed swallow initiation-vallecula;Reduced epiglottic inversion;Reduced tongue base retraction;Pharyngeal residue - valleculae Pharyngeal Material does not enter airway Pharyngeal- Regular -- Pharyngeal -- Pharyngeal- Multi-consistency -- Pharyngeal -- Pharyngeal- Pill WFL Pharyngeal Material does not enter airway Pharyngeal Comment chin tuck posture nor head turn left nor right helpful to prevent laryngeal penetration or residuals, chin tuck posture WORSENED airway protection as it allowed spillage of barium into open larynx, pt initiated reflexive dry swallows decrease residuals in pharynx, pt does not consistently sense pharyngeal residuals thus cues to "hock" and to follow solids with liquids helpful to expectorate or clear; trace laryngeal penetration with nectar was cleared with same swallow  CHL IP CERVICAL ESOPHAGEAL PHASE 12/24/2018 Cervical Esophageal Phase Impaired Pudding Teaspoon -- Pudding Cup -- Honey Teaspoon -- Honey Cup -- Nectar Teaspoon -- Nectar Cup -- Nectar Straw -- Thin Teaspoon -- Thin Cup -- Thin Straw -- Puree -- Mechanical Soft -- Regular --  Multi-consistency -- Pill -- Cervical Esophageal Comment Upon esophageal sweep, pt appears with stasis in her esophagus WITHOUT sensation; nectar liquids vs thin did not appear to vary in clearance Luanna Salk, MS [facility] SLP Acute Rehab Services Pager [phone] Office [phone] Macario Golds 12/24/2018, 4:05 PM                   Scheduled Meds:  ALPRAZolam  1 mg Oral Daily   amitriptyline  50 mg Oral QHS   amLODipine  5 mg Oral QPM   budesonide (PULMICORT) nebulizer solution  0.5 mg Nebulization BID   celecoxib  200 mg Oral QPC breakfast   enoxaparin (LOVENOX) injection  20 mg Subcutaneous Q24H   feeding supplement (ENSURE ENLIVE)  237 mL Oral BID BM   fluticasone  2 spray Each Nare Daily   ipratropium-albuterol  3 mL Nebulization TID   levothyroxine  88 mcg Oral QAC breakfast   loratadine  10 mg Oral Daily   multivitamin with minerals  1 tablet Oral Q M,W,F   pantoprazole  40 mg Oral BID   polyethylene glycol  17 g Oral Daily   senna-docusate  1 tablet Oral BID   sertraline  50 mg Oral Q1200   Continuous Infusions:  ampicillin-sulbactam (UNASYN) IV 3 g (12/26/18 0917)     LOS: 4 days    Time spent: 35 minutes.     Elmarie Shiley, MD [facility] Pager [phone]  If 7PM-7AM, please contact night-coverage [email or web address] Password [facility] 12/26/2018, 10:54 AM

## 2018-12-26 NOTE — Progress Notes (Signed)
  Speech Language Pathology Treatment: Dysphagia  Patient Details Name: [name] MRN: [id number] DOB: 01/01/1931 Today's Date: 12/26/2018 Time: 2330-0762 SLP Time Calculation (min) (ACUTE ONLY): 36 min  Assessment / Plan / Recommendation Clinical Impression  Patient educated to findings of MBS using [place] and compared her swallow to a normal MBS study.  Pt observed consuming po with cough immediately after liquid consumption.  To mitigate aspiration pna risk, SLP advised pt that consuming water with meals is recommended to diminish aspiration risk.  Observed patient with meals - including milk and chopped chicken.  No indication of aspiration = during meal. Advised pt eat small frequent meals - 5-6 small meals a day.    Pt demonstrated swallow precautions to SLP with modified independence.  Using teach back pt educated to precautions.   Recommend follow up SLP for dysphagia management after discharge from hospital.         HPI HPI: 85 yo female adm to [facility] with pna after daughter noted she has AMS via phone.  Pt found to have a cavitary lesion.  PMH + for COPD, chronic aspiration, GERD, recurrent pna, depression anxiety, Hashimoto's s/p thyroidectomy with subsequent vocal cord paralysis.  She has previously seen ENT Dr Janace Hoard who offered to stent her vocal fold but she declined per Dr Janee Morn notes from approximately 2015.  Pt admits she has dysphagia for several years causing her to "choke" - meaning "cough" during and after intake. She admits coughing extensively that she refluxed and aspirated prior to admission.  She does take Prilosec and thus reports no burning with refluxing.  Pt admits to "choking" on food and drink - mostly food but also water.  She reports taking medicine with applesauce as if she does not she may choke.  Pt's prior imaging studies have shown patulous thoracic esophagus with fluid line concerning for esophageal dysmotility and/or reflux.      SLP Plan  Continue with  current plan of care       Recommendations  Diet recommendations: Dysphagia 3 (mechanical soft);Thin liquid Liquids provided via: Cup;Straw Medication Administration: Whole meds with puree Supervision: Patient able to self feed Compensations: Slow rate;Small sips/bites;Follow solids with liquid(start all intake with liquids, "Hock" and expectorate ) Postural Changes and/or Swallow Maneuvers: Seated upright 90 degrees;Upright 30-60 min after meal                Oral Care Recommendations: Oral care QID Follow up Recommendations: Home health SLP SLP Visit Diagnosis: Dysphagia, pharyngeal phase (R13.13) Plan: Continue with current plan of care       [place], [facility] SLP [place] Pager [phone] Office [phone]  Macario Golds 12/26/2018, 5:32 PM

## 2018-12-27 DIAGNOSIS — E43 Unspecified severe protein-calorie malnutrition: Secondary | ICD-10-CM | POA: Insufficient documentation

## 2018-12-27 LAB — BASIC METABOLIC PANEL
Anion gap: 10 (ref 5–15)
BUN: 13 mg/dL (ref 8–23)
CO2: 34 mmol/L — ABNORMAL HIGH (ref 22–32)
Calcium: 8.4 mg/dL — ABNORMAL LOW (ref 8.9–10.3)
Chloride: 89 mmol/L — ABNORMAL LOW (ref 98–111)
Creatinine, Ser: 0.43 mg/dL — ABNORMAL LOW (ref 0.44–1.00)
GFR calc Af Amer: >60 mL/min (ref >60)
GFR calc non Af Amer: >60 mL/min (ref >60)
Glucose, Bld: 111 mg/dL — ABNORMAL HIGH (ref 70–99)
Potassium: 3.7 mmol/L (ref 3.5–5.1)
Sodium: 133 mmol/L — ABNORMAL LOW (ref 135–145)

## 2018-12-27 MED ORDER — AMOXICILLIN-POT CLAVULANATE 875-125 MG PO TABS
1.0000 | ORAL_TABLET | Freq: Two times a day (BID) | ORAL | Status: DC
  Administered 2018-12-27 – 2018-12-28 (×3): 1 via ORAL
  Filled 2018-12-27 (×3): qty 1

## 2018-12-27 NOTE — Progress Notes (Addendum)
PROGRESS NOTE    [name]  IRJ:[id number] DOB: [birth year] DOA: 12/22/2018 PCP: Lujean Amel, MD  Brief Narrative:-year-old with past medical history significant for bronchiectasis, COPD, recurrent pneumonia, hypertension, hypothyroidism, Hashimoto thyroiditis and GERD with vocal cord paralysis who presented to the ER with significant shortness of breath and cough.  In the ER she was noted to be hypoxic.  CT chest showed cavitary pneumonia with multiloculated.  COVID screen negative.  Patient was admitted for IV antibiotics.  She was also found to have strep viridans bacteremia.  ID and pulmonologist has been consulted. AFB was ordered.  Patient has a history of MAC     Assessment & Plan:   Principal Problem:   Cavitary pneumonia Active Problems:   Vocal cord paresis   Bronchiectasis with acute exacerbation (HCC)   GERD (gastroesophageal reflux disease)   Acute on chronic respiratory failure with hypoxia (HCC)   Hyponatremia   Essential hypertension   COPD with acute exacerbation (HCC)   Depression with anxiety   Hypothyroidism   Pressure injury of skin   Chronic obstructive pulmonary disease (HCC)   Bacteremia due to Streptococcus   History of Mycobacterium avium intracellulare infection   Unspecified severe protein-calorie malnutrition (HCC)   Aortic atherosclerosis (HCC)   Normocytic anemia   Protein-calorie malnutrition, severe   1-Acute on chronic hypoxic respiratory failure secondary to cavitary pneumonia, chronic bronchiectasis: Probably related to aspiration pneumonia, history of vocal cord paralysis. Evaluated by infectious disease and pulmonologist.  They are recommending to check a sputum for AFB pending at this time. SIRS coronavirus 2-. -Modified barium evaluation: Consistent with healed dysphagia and mild pharyngeal dysphagia.  Patient recommended dysphagia mechanical soft diet, home medication with pure. - Discharge on amoxicillin clavulanate. Needs  treatment for at least 2 weeks and serial chest x ray to determine optimal duration treatment.  Plan to observed today on oral antibiotics, anticipate discharge 6-20  2-Strep viridans bacteremia: Source could be cavitary pneumonia. Echocardiogram; normal ejection fraction no evidence of endocarditis. Appreciate Dr. Megan Salon evaluation and recommendation. Treated with  Unasyn Repeated blood culture no growth to date.   Hypokalemia hypomagnesemia replace. Labs pending for the morning.   Hypothyroidism: Continue with Synthroid  COPD: Stable continue with Pulmicort  Hypertension continue with Norvasc  GERD continue with PPI changed to twice daily  Hyponatremia improved  Stage II sacral pressure injury POA.  Continue with wound care  Constipation continue with MiraLAX and Senokot  Severe protein calorie malnutrition Continue with Ensure  Pressure Injury 12/23/18 Sacrum Mid Stage II -  Partial thickness loss of dermis presenting as a shallow open ulcer with a red, pink wound bed without slough. (Active)  12/23/18 0929  Location: Sacrum  Location Orientation: Mid  Staging: Stage II -  Partial thickness loss of dermis presenting as a shallow open ulcer with a red, pink wound bed without slough.  Wound Description (Comments):   Present on Admission: Yes    Estimated body mass index is 13.64 kg/m as calculated from the following:   Height as of this encounter: _0  (1.6 m).   Weight as of this encounter: 34.9 kg.   DVT prophylaxis: Lovenox Code Status: Full code Family Communication: daughter updated. Disposition Plan: Transition from IV to oral antibiotics today. Monitor on oral antibiotic .  awaiting second AFB test    Consultants:   Pulmonologist  Infectious disease   Procedures: Echo; The left ventricle has normal systolic function, with an ejection fraction of 55-60%. The cavity size was  normal. Left ventricular diastolic Doppler parameters are consistent with  impaired relaxation.  2. The right ventricle has normal systolic function. The cavity was normal.  3. Trivial pericardial effusion is present.  4. The mitral valve is abnormal. Mild thickening of the mitral valve leaflet.  5. The tricuspid valve is grossly normal.  6. The aortic valve is tricuspid. Mild calcification of the aortic valve. Aortic valve regurgitation is trivial by color flow Doppler. No stenosis of the aortic valve.   7. Normal LV systolic function; mild diastolic dysfunction; trace AI; mild MR.   Antimicrobials:   Unasyn   Subjective: She report improvement of cough and dyspnea.   Objective: Vitals:   12/26/18 2159 12/27/18 0523 12/27/18 0832 12/27/18 0838  BP: (!) 152/73 (!) 128/59    Pulse: 94 89    Resp: 20 20    Temp: 98 F (36.7 C) 97.6 F (36.4 C)    TempSrc: Oral Oral    SpO2: 95% 100% 100% 99%  Weight:      Height:        Intake/Output Summary (Last 24 hours) at 12/27/2018 1252 Last data filed at 12/27/2018 0900 Gross per 24 hour  Intake 358 ml  Output 300 ml  Net 58 ml   Filed Weights   12/22/18 1904  Weight: 34.9 kg    Examination:  General exam: NAD Respiratory system: Ron[place] left side Cardiovascular system: S 1, S 2 RRR Gastrointestinal system: BS present, soft, nt Central nervous system: non focal.  Extremities: Symmetric power.  Skin: No rashes Psychiatry: Alert and priented  Data Reviewed: I have personally reviewed following labs and imaging studies  CBC: Recent Labs  Lab 12/22/18 1918 12/24/18 0436 12/26/18 0803  WBC 10.5 9.4 9.3  NEUTROABS 7.7 6.7  --   HGB 10.6* 10.4* 11.0*  HCT 33.3* 33.8* 35.4*  MCV 88.6 90.9 89.6  PLT 178 293 696   Basic Metabolic Panel: Recent Labs  Lab 12/22/18 2015 12/23/18 0402 12/24/18 0436 12/26/18 0803  NA 133* 136 136 136  K 3.6 2.8* 3.9 4.1  CL 90* 92* 95* 89*  CO2 35* 36* 32 37*  GLUCOSE 139* 120* 98 103*  BUN _0 CREATININE 0.53 0.42* 0.36* 0.42*  CALCIUM 8.2*  8.1* 7.9* 8.5*  MG  --  1.5* 2.1  --    GFR: Estimated Creatinine Clearance: 27.8 mL/min (A) (by C-G formula based on SCr of 0.42 mg/dL (L)). Liver Function Tests: Recent Labs  Lab 12/23/18 0402  AST 17  ALT 14  ALKPHOS 94  BILITOT 0.3  PROT 6.9  ALBUMIN 2.6*   No results for input(s): LIPASE, AMYLASE in the last 168 hours. No results for input(s): AMMONIA in the last 168 hours. Coagulation Profile: No results for input(s): INR, PROTIME in the last 168 hours. Cardiac Enzymes: Recent Labs  Lab 12/22/18 1918  TROPONINI <0.03   BNP (last 3 results) No results for input(s): PROBNP in the last 8760 hours. HbA1C: No results for input(s): HGBA1C in the last 72 hours. CBG: No results for input(s): GLUCAP in the last 168 hours. Lipid Profile: No results for input(s): CHOL, HDL, LDLCALC, TRIG, CHOLHDL, LDLDIRECT in the last 72 hours. Thyroid Function Tests: No results for input(s): TSH, T4TOTAL, FREET4, T3FREE, THYROIDAB in the last 72 hours. Anemia Panel: No results for input(s): VITAMINB12, FOLATE, FERRITIN, TIBC, IRON, RETICCTPCT in the last 72 hours. Sepsis Labs: Recent Labs  Lab 12/22/18 1918  LATICACIDVEN 1.1    Recent Results (  from the past 240 hour(s))  SARS Coronavirus 2 (CEPHEID - Performed in [place] hospital lab), Hosp Order     Status: None   Collection Time: 12/22/18  7:18 PM   Specimen: Nasopharyngeal Swab  Result Value Ref Range Status   SARS Coronavirus 2 NEGATIVE NEGATIVE Final    Comment: (NOTE) If result is NEGATIVE SARS-CoV-2 target nucleic acids are NOT DETECTED. The SARS-CoV-2 RNA is generally detectable in upper and lower  respiratory specimens during the acute phase of infection. The lowest  concentration of SARS-CoV-2 viral copies this assay can detect is 250  copies / mL. A negative result does not preclude SARS-CoV-2 infection  and should not be used as the sole basis for treatment or other  patient management decisions.  A negative  result may occur with  improper specimen collection / handling, submission of specimen other  than nasopharyngeal swab, presence of viral mutation(s) within the  areas targeted by this assay, and inadequate number of viral copies  (<250 copies / mL). A negative result must be combined with clinical  observations, patient history, and epidemiological information. If result is POSITIVE SARS-CoV-2 target nucleic acids are DETECTED. The SARS-CoV-2 RNA is generally detectable in upper and lower  respiratory specimens dur ing the acute phase of infection.  Positive  results are indicative of active infection with SARS-CoV-2.  Clinical  correlation with patient history and other diagnostic information is  necessary to determine patient infection status.  Positive results do  not rule out bacterial infection or co-infection with other viruses. If result is PRESUMPTIVE POSTIVE SARS-CoV-2 nucleic acids MAY BE PRESENT.   A presumptive positive result was obtained on the submitted specimen  and confirmed on repeat testing.  While 2019 novel coronavirus  (SARS-CoV-2) nucleic acids may be present in the submitted sample  additional confirmatory testing may be necessary for epidemiological  and / or clinical management purposes  to differentiate between  SARS-CoV-2 and other Sarbecovirus currently known to infect humans.  If clinically indicated additional testing with an alternate test  methodology [phone]) is advised. The SARS-CoV-2 RNA is generally  detectable in upper and lower respiratory sp ecimens during the acute  phase of infection. The expected result is Negative. Fact Sheet for Patients:  StrictlyIdeas.no Fact Sheet for Healthcare Providers: BankingDealers.co.za This test is not yet approved or cleared by the [place] FDA and has been authorized for detection and/or diagnosis of SARS-CoV-2 by FDA under an Emergency Use Authorization  (EUA).  This EUA will remain in effect (meaning this test can be used) for the duration of the COVID-19 declaration under Section 564(b)(1) of the Act, 21 U.S.C. section 360bbb-3(b)(1), unless the authorization is terminated or revoked sooner. Performed at [facility], [place] [address], [place], [place] [address]   Culture, blood (routine x 2) Call MD if unable to obtain prior to antibiotics being given     Status: None (Preliminary result)   Collection Time: 12/22/18 11:46 PM   Specimen: BLOOD  Result Value Ref Range Status   Specimen Description   Final    BLOOD BLOOD RIGHT ARM Performed at [place] [address], [place], [place] [address]    Special Requests   Final    BOTTLES DRAWN AEROBIC AND ANAEROBIC Blood Culture adequate volume Performed at [place] [address], [place], [place] [address]    Culture   Final    NO GROWTH 4 DAYS Performed at [facility],  [address] [place], [place] [address]    Report Status PENDING  Incomplete  Culture, blood (routine x 2) Call MD if unable to obtain prior to antibiotics being given     Status: Abnormal   Collection Time: 12/22/18 11:46 PM   Specimen: BLOOD  Result Value Ref Range Status   Specimen Description   Final    BLOOD BLOOD LEFT FOREARM Performed at [place] [address], [place], [place] [address]    Special Requests   Final    BOTTLES DRAWN AEROBIC AND ANAEROBIC Blood Culture adequate volume Performed at [place] [address], [place], [place] [address]    Culture  Setup Time   Final    GRAM POSITIVE COCCI IN CHAINS IN PAIRS IN BOTH AEROBIC AND ANAEROBIC BOTTLES CRITICAL RESULT CALLED TO, READ BACK BY AND VERIFIED WITH: Colin Rhein PHARMD 2233 12/23/18 A BROWNING    Culture (A)  Final    VIRIDANS STREPTOCOCCUS STAPHYLOCOCCUS SPECIES (COAGULASE NEGATIVE) THE SIGNIFICANCE OF ISOLATING THIS  ORGANISM FROM A SINGLE SET OF BLOOD CULTURES WHEN MULTIPLE SETS ARE DRAWN IS UNCERTAIN. PLEASE NOTIFY THE MICROBIOLOGY DEPARTMENT WITHIN ONE WEEK IF SPECIATION AND SENSITIVITIES ARE REQUIRED. Performed at [facility], [place] [address], [place], [place] [address]    Report Status 12/26/2018 FINAL  Final  Blood Culture ID Panel (Reflexed)     Status: Abnormal   Collection Time: 12/22/18 11:46 PM  Result Value Ref Range Status   Enterococcus species NOT DETECTED NOT DETECTED Final   Listeria monocytogenes NOT DETECTED NOT DETECTED Final   Staphylococcus species NOT DETECTED NOT DETECTED Final   Staphylococcus aureus (BCID) NOT DETECTED NOT DETECTED Final   Streptococcus species DETECTED (A) NOT DETECTED Final    Comment: Not Enterococcus species, Streptococcus agalactiae, Streptococcus pyogenes, or Streptococcus pneumoniae. CRITICAL RESULT CALLED TO, READ BACK BY AND VERIFIED WITH: Colin Rhein PHARMD 2233 12/23/18 A BROWNING    Streptococcus agalactiae NOT DETECTED NOT DETECTED Final   Streptococcus pneumoniae NOT DETECTED NOT DETECTED Final   Streptococcus pyogenes NOT DETECTED NOT DETECTED Final   Acinetobacter baumannii NOT DETECTED NOT DETECTED Final   Enterobacteriaceae species NOT DETECTED NOT DETECTED Final   Enterobacter cloacae complex NOT DETECTED NOT DETECTED Final   Escherichia coli NOT DETECTED NOT DETECTED Final   Klebsiella oxytoca NOT DETECTED NOT DETECTED Final   Klebsiella pneumoniae NOT DETECTED NOT DETECTED Final   Proteus species NOT DETECTED NOT DETECTED Final   Serratia marcescens NOT DETECTED NOT DETECTED Final   Haemophilus influenzae NOT DETECTED NOT DETECTED Final   Neisseria meningitidis NOT DETECTED NOT DETECTED Final   Pseudomonas aeruginosa NOT DETECTED NOT DETECTED Final   Candida albicans NOT DETECTED NOT DETECTED Final   Candida glabrata NOT DETECTED NOT DETECTED Final   Candida krusei NOT DETECTED NOT DETECTED Final   Candida parapsilosis NOT DETECTED  NOT DETECTED Final   Candida tropicalis NOT DETECTED NOT DETECTED Final    Comment: Performed at [facility], [place] [address] [place], [place] [address]  Culture, sputum-assessment     Status: None   Collection Time: 12/23/18 12:51 AM   Specimen: Expectorated Sputum  Result Value Ref Range Status   Specimen Description EXPECTORATED SPUTUM  Final   Special Requests NONE  Final   Sputum evaluation   Final    THIS SPECIMEN IS ACCEPTABLE FOR SPUTUM CULTURE Performed at [facility], [place] [address] [place], [place] [address]    Report  Status 12/23/2018 FINAL  Final  Culture, respiratory     Status: None   Collection Time: 12/23/18 12:51 AM  Result Value Ref Range Status   Specimen Description   Final    EXPECTORATED SPUTUM Performed at [place] [address] [place], [place] [address]    Special Requests   Final    NONE Reflexed from [phone] Performed at [place] [address] [place], [place] [address]    Gram Stain   Final    MODERATE WBC PRESENT,BOTH PMN AND MONONUCLEAR FEW GRAM POSITIVE COCCI IN PAIRS RARE GRAM POSITIVE RODS    Culture   Final    MODERATE Consistent with normal respiratory flora. Performed at [facility], [place] [address], [place], [place] [address]    Report Status 12/25/2018 FINAL  Final  Expectorated sputum assessment w rflx to resp cult     Status: None   Collection Time: 12/23/18 11:31 AM   Specimen: Sputum  Result Value Ref Range Status   Specimen Description SPUTUM  Final   Special Requests NONE  Final   Sputum evaluation   Final    THIS SPECIMEN IS ACCEPTABLE FOR SPUTUM CULTURE Performed at [facility], [place] [address], [place], [place] [address]    Report Status 12/23/2018 FINAL  Final  Culture, respiratory     Status: None   Collection Time: 12/23/18 11:31 AM   Specimen: SPU  Result Value Ref Range Status   Specimen Description   Final     SPUTUM Performed at [place] [address] [place], [place] [address]    Special Requests   Final    NONE Reflexed from [id number] Performed at [facility], [place] [address] [place], [place] [address]    Gram Stain   Final    ABUNDANT WBC PRESENT,BOTH PMN AND MONONUCLEAR MODERATE GRAM POSITIVE COCCI FEW GRAM VARIABLE ROD RARE YEAST    Culture   Final    MODERATE Consistent with normal respiratory flora. Performed at [facility], [place] [address] [place], [place] [address]    Report Status 12/26/2018 FINAL  Final  Acid Fast Smear (AFB)     Status: None   Collection Time: 12/24/18  2:39 PM   Specimen: Sputum  Result Value Ref Range Status   AFB Specimen Processing Concentration  Final   Acid Fast Smear Negative  Final    Comment: (NOTE) Performed At: [facility] [place], [place] [id number] Rush Farmer MD QD:[id number]    Source (AFB) SPU  Final    Comment: Performed at [facility], [place] [address], [place], [place] [address]  Culture, blood (routine x 2)     Status: None (Preliminary result)   Collection Time: 12/26/18 12:21 AM   Specimen: BLOOD  Result Value Ref Range Status   Specimen Description   Final    BLOOD RIGHT ARM Performed at [place] [address], [place], [place] [address]    Special Requests   Final    BOTTLES DRAWN AEROBIC AND ANAEROBIC Blood Culture adequate volume Performed at [place] [address], [place], [place] [address]    Culture   Final    NO GROWTH 1 DAY Performed at [facility], [place] [address] [place],  [address]    Report Status PENDING  Incomplete  Culture, blood (routine x 2)     Status:  None (Preliminary result)   Collection Time: 12/26/18 12:21 AM   Specimen: BLOOD  Result Value Ref Range Status   Specimen Description   Final    BLOOD RIGHT WRIST Performed at [place] [address] [place], [place] [address]    Special Requests   Final    BOTTLES DRAWN AEROBIC AND ANAEROBIC Blood Culture adequate volume Performed at [place] [address] [place], [place] [address]    Culture   Final    NO GROWTH 1 DAY Performed at [facility], [place] [address], [place],  [address]    Report Status PENDING  Incomplete         Radiology Studies: No results found.      Scheduled Meds: . ALPRAZolam  1 mg Oral Daily  . amitriptyline  50 mg Oral QHS  . amLODipine  5 mg Oral QPM  . amoxicillin-clavulanate  1 tablet Oral Q12H  . budesonide (PULMICORT) nebulizer solution  0.5 mg Nebulization BID  . celecoxib  200 mg Oral QPC breakfast  . enoxaparin (LOVENOX) injection  20 mg Subcutaneous Q24H  . feeding supplement (ENSURE ENLIVE)  237 mL Oral BID BM  . fluticasone  2 spray Each Nare Daily  . ipratropium-albuterol  3 mL Nebulization TID  . levothyroxine  88 mcg Oral QAC breakfast  . loratadine  10 mg Oral Daily  . multivitamin with minerals  1 tablet Oral Q M,W,F  . pantoprazole  40 mg Oral BID  . polyethylene glycol  17 g Oral Daily  . senna-docusate  1 tablet Oral BID  . sertraline  50 mg Oral Q1200   Continuous Infusions:    LOS: 5 days    Time spent: 35 minutes.     Elmarie Shiley, MD [facility] Pager [phone]  If 7PM-7AM, please contact night-coverage [email or web address] Password [facility] 12/27/2018, 12:52 PM

## 2018-12-27 NOTE — Progress Notes (Signed)
Physical Therapy Treatment Patient Details Name: [name] MRN: [id number] DOB: Oct 14, 1930 Today's Date: 12/27/2018    History of Present Illness 85 yo female admitted with cavitary pna. Hx of COPD-O2 dep, recurrent pna, chronic aspiration, vocal cord paralysis, anorexia, Hashimoto's thyroiditis    PT Comments    Assisted OOB to amb a great distance in hallway then returned to room.  Assisted to bathroom then back to bed.  Pt moves well.  General Gait Details: remained on 3 lts nasal oxygen avg 96% tolerated distance well.  Follow Up Recommendations  Home health PT;Supervision - Intermittent     Equipment Recommendations  None recommended by PT    Recommendations for Other Services       Precautions / Restrictions Precautions Precautions: Fall Precaution Comments: O2 dep Restrictions Weight Bearing Restrictions: No    Mobility  Bed Mobility Overal bed mobility: Modified Independent             General bed mobility comments: self able with increased assist  Transfers Overall transfer level: Needs assistance Equipment used: Rolling walker (2 wheeled) Transfers: Sit to/from Stand Sit to Stand: Min guard;Min assist Stand pivot transfers: Min guard       General transfer comment: good safety cognition and use of hands to steady self  Ambulation/Gait Ambulation/Gait assistance: Min guard Gait Distance (Feet): 145 Feet Assistive device: Rolling walker (2 wheeled) Gait Pattern/deviations: Step-through pattern;Decreased stride length;Trunk flexed     General Gait Details: remained on 3 lts nasal oxygen avg 96% tolerated distance well   Stairs             Wheelchair Mobility    Modified Rankin (Stroke Patients Only)       Balance                                            Cognition   Behavior During Therapy: WFL for tasks assessed/performed Overall Cognitive Status: Within Functional Limits for tasks assessed                                  General Comments: pleasant and completely oriented      Exercises      General Comments        Pertinent Vitals/Pain Pain Assessment: No/denies pain    Home Living                      Prior Function            PT Goals (current goals can now be found in the care plan section) Progress towards PT goals: Progressing toward goals    Frequency    Min 3X/week      PT Plan Current plan remains appropriate    Co-evaluation              AM-PAC PT "6 Clicks" Mobility   Outcome Measure  Help needed turning from your back to your side while in a flat bed without using bedrails?: None Help needed moving from lying on your back to sitting on the side of a flat bed without using bedrails?: A Little Help needed moving to and from a bed to a chair (including a wheelchair)?: A Little Help needed standing up from a chair using your arms (e.g., wheelchair or bedside chair)?: A  Little Help needed to walk in hospital room?: A Little Help needed climbing 3-5 steps with a railing? : A Little 6 Click Score: 19    End of Session Equipment Utilized During Treatment: Gait belt Activity Tolerance: Patient tolerated treatment well Patient left: in bed;with call bell/phone within reach;with bed alarm set Nurse Communication: Mobility status PT Visit Diagnosis: Unsteadiness on feet (R26.81);Muscle weakness (generalized) (M62.81)     Time: 1798-1025 PT Time Calculation (min) (ACUTE ONLY): 25 min  Charges:  $Gait Training: 8-22 mins $Therapeutic Exercise: 8-22 mins                     Rica Koyanagi  PTA Acute  Rehabilitation Services Pager      [phone] Office      [phone]

## 2018-12-27 NOTE — Progress Notes (Signed)
  Speech Language Pathology Treatment: Dysphagia  Patient Details Name: [name] MRN: [id number] DOB: Dec 05, 1930 Today's Date: 12/27/2018 Time: 4782-9562 SLP Time Calculation (min) (ACUTE ONLY): 25 min  Assessment / Plan / Recommendation Clinical Impression  Patient seen to address dysphagia goals with focus on observing patient with thin liquids, providing education and review of swallow safety recommendations. Patient able to tell SLP all of the recommendations that initial SLP had made regarding her swallowing, and that SLP had shown her the MBS videos. She reports that drinking liquids prior to eating has really helped. She did not exhibit any overt s/s of aspiration or penetration with thin liquids via straw sips. SLP provided patient with some additional suggestions for swallowing, including eating some gelatin prior to pills to create slick surface on throat, but stressed the importance of water intake. Patient did not have any further questions and appears to have retained and implemented swallow safety strategies as per previous SLP recommendations.      HPI HPI: 85 yo female adm to [facility] with pna after daughter noted she has AMS via phone.  Pt found to have a cavitary lesion.  PMH + for COPD, chronic aspiration, GERD, recurrent pna, depression anxiety, Hashimoto's s/p thyroidectomy with subsequent vocal cord paralysis.  She has previously seen ENT Dr Janace Hoard who offered to stent her vocal fold but she declined per Dr Janee Morn notes from approximately 2015.  Pt admits she has dysphagia for several years causing her to "choke" - meaning "cough" during and after intake. She admits coughing extensively that she refluxed and aspirated prior to admission.  She does take Prilosec and thus reports no burning with refluxing.  Pt admits to "choking" on food and drink - mostly food but also water.  She reports taking medicine with applesauce as if she does not she may choke.  Pt's prior imaging studies  have shown patulous thoracic esophagus with fluid line concerning for esophageal dysmotility and/or reflux.      SLP Plan  Continue with current plan of care       Recommendations  Diet recommendations: Dysphagia 3 (mechanical soft);Thin liquid Liquids provided via: Straw Medication Administration: Whole meds with puree Supervision: Patient able to self feed Compensations: Slow rate;Small sips/bites;Follow solids with liquid Postural Changes and/or Swallow Maneuvers: Seated upright 90 degrees;Upright 30-60 min after meal                Oral Care Recommendations: Oral care BID Follow up Recommendations: Home health SLP SLP Visit Diagnosis: Dysphagia, pharyngeal phase (R13.13) Plan: Continue with current plan of care       GO                Dannial Monarch 12/27/2018, 5:10 PM    Sonia Baller, MA, CCC-SLP Speech Therapy [facility]

## 2018-12-27 NOTE — Progress Notes (Signed)
Patient ID: [name], female   DOB: 04-May-1931, 85 y.o.   MRN: [id number]         [facility] for Infectious Disease  Date of Admission:  12/22/2018   Total days of antibiotics 5                 ASSESSMENT: She seems to be improving on empiric therapy for presumed polymicrobial cavitary pneumonia secondary to chronic aspiration complicated by transient [place] streptococcal bacteremia.  Her repeat blood cultures are negative and she has no clinical evidence of endocarditis.  I have switched her to oral amoxicillin clavulanate.  I plan on at least 2 weeks of therapy then repeat chest x-ray to help guide optimal duration of treatment.  PLAN: 1. Change ampicillin sulbactam to oral amoxicillin clavulanate 2. I have arranged follow-up in my clinic on 01/06/2019 3. Please call Dr. Carlyle Basques [phone]) for any infectious disease questions this weekend  Principal Problem:   Cavitary pneumonia Active Problems:   Bacteremia due to Streptococcus   History of Mycobacterium avium intracellulare infection   Vocal cord paresis   Bronchiectasis with acute exacerbation (HCC)   GERD (gastroesophageal reflux disease)   Acute on chronic respiratory failure with hypoxia (HCC)   Hyponatremia   Essential hypertension   COPD with acute exacerbation ([place])   Depression with anxiety   Hypothyroidism   Pressure injury of skin   Chronic obstructive pulmonary disease (HCC)   Unspecified severe protein-calorie malnutrition (HCC)   Aortic atherosclerosis (HCC)   Normocytic anemia   Protein-calorie malnutrition, severe   Scheduled Meds: . ALPRAZolam  1 mg Oral Daily  . amitriptyline  50 mg Oral QHS  . amLODipine  5 mg Oral QPM  . amoxicillin-clavulanate  1 tablet Oral Q12H  . budesonide (PULMICORT) nebulizer solution  0.5 mg Nebulization BID  . celecoxib  200 mg Oral QPC breakfast  . enoxaparin (LOVENOX) injection  20 mg Subcutaneous Q24H  . feeding supplement (ENSURE ENLIVE)  237 mL  Oral BID BM  . fluticasone  2 spray Each Nare Daily  . ipratropium-albuterol  3 mL Nebulization TID  . levothyroxine  88 mcg Oral QAC breakfast  . loratadine  10 mg Oral Daily  . multivitamin with minerals  1 tablet Oral Q M,W,F  . pantoprazole  40 mg Oral BID  . polyethylene glycol  17 g Oral Daily  . senna-docusate  1 tablet Oral BID  . sertraline  50 mg Oral Q1200   Continuous Infusions:  PRN Meds:.ALPRAZolam, guaiFENesin   SUBJECTIVE: She is feeling better.  She does not think that she is coughing as much as she was upon admission.  Her appetite is improving.  Review of Systems: Review of Systems  Constitutional: Positive for malaise/fatigue and weight loss. Negative for chills, diaphoresis and fever.  Respiratory: Positive for cough, sputum production and shortness of breath. Negative for hemoptysis and wheezing.   Cardiovascular: Negative for chest pain.    Allergies  Allergen Reactions  . Ambien [Zolpidem]     Foggy mind-gets up and walks around (pt does not remember anything of the event)  . Asa [Aspirin] Other (See Comments)    Spits up blood  . Boniva [Ibandronic Acid] Other (See Comments)    GI upset  . Cystex [Germanium] Nausea Only  . Doxycycline Nausea Only and Hypertension    OBJECTIVE: Vitals:   12/26/18 2159 12/27/18 0523 12/27/18 0832 12/27/18 0838  BP: (!) 152/73 (!) 128/59    Pulse: 94 89  Resp: 20 20    Temp: 98 F (36.7 C) 97.6 F (36.4 C)    TempSrc: Oral Oral    SpO2: 95% 100% 100% 99%  Weight:      Height:       Body mass index is 13.64 kg/m.  Physical Exam Constitutional:      Comments: She is sitting up eating breakfast.  She is in good spirits.  Cardiovascular:     Rate and Rhythm: Normal rate and regular rhythm.     Heart sounds: No gallop.   Pulmonary:     Effort: Pulmonary effort is normal.     Breath sounds: No wheezing or rales.     Comments: Prominent crackles throughout all lung fields are unchanged.    Lab  Results Lab Results  Component Value Date   WBC 9.3 12/26/2018   HGB 11.0 (L) 12/26/2018   HCT 35.4 (L) 12/26/2018   MCV 89.6 12/26/2018   PLT 330 12/26/2018    Lab Results  Component Value Date   CREATININE 0.42 (L) 12/26/2018   BUN 12 12/26/2018   NA 136 12/26/2018   K 4.1 12/26/2018   CL 89 (L) 12/26/2018   CO2 37 (H) 12/26/2018    Lab Results  Component Value Date   ALT 14 12/23/2018   AST 17 12/23/2018   ALKPHOS 94 12/23/2018   BILITOT 0.3 12/23/2018     Microbiology: Recent Results (from the past 240 hour(s))  SARS Coronavirus 2 (CEPHEID - Performed in [facility] hospital lab), Hosp Order     Status: None   Collection Time: 12/22/18  7:18 PM   Specimen: Nasopharyngeal Swab  Result Value Ref Range Status   SARS Coronavirus 2 NEGATIVE NEGATIVE Final    Comment: (NOTE) If result is NEGATIVE SARS-CoV-2 target nucleic acids are NOT DETECTED. The SARS-CoV-2 RNA is generally detectable in upper and lower  respiratory specimens during the acute phase of infection. The lowest  concentration of SARS-CoV-2 viral copies this assay can detect is 250  copies / mL. A negative result does not preclude SARS-CoV-2 infection  and should not be used as the sole basis for treatment or other  patient management decisions.  A negative result may occur with  improper specimen collection / handling, submission of specimen other  than nasopharyngeal swab, presence of viral mutation(s) within the  areas targeted by this assay, and inadequate number of viral copies  (<250 copies / mL). A negative result must be combined with clinical  observations, patient history, and epidemiological information. If result is POSITIVE SARS-CoV-2 target nucleic acids are DETECTED. The SARS-CoV-2 RNA is generally detectable in upper and lower  respiratory specimens dur ing the acute phase of infection.  Positive  results are indicative of active infection with SARS-CoV-2.  Clinical  correlation with  patient history and other diagnostic information is  necessary to determine patient infection status.  Positive results do  not rule out bacterial infection or co-infection with other viruses. If result is PRESUMPTIVE POSTIVE SARS-CoV-2 nucleic acids MAY BE PRESENT.   A presumptive positive result was obtained on the submitted specimen  and confirmed on repeat testing.  While 2019 novel coronavirus  (SARS-CoV-2) nucleic acids may be present in the submitted sample  additional confirmatory testing may be necessary for epidemiological  and / or clinical management purposes  to differentiate between  SARS-CoV-2 and other Sarbecovirus currently known to infect humans.  If clinically indicated additional testing with an alternate test  methodology [phone]) is  advised. The SARS-CoV-2 RNA is generally  detectable in upper and lower respiratory sp ecimens during the acute  phase of infection. The expected result is Negative. Fact Sheet for Patients:  StrictlyIdeas.no Fact Sheet for Healthcare Providers: BankingDealers.co.za This test is not yet approved or cleared by the [place] FDA and has been authorized for detection and/or diagnosis of SARS-CoV-2 by FDA under an Emergency Use Authorization (EUA).  This EUA will remain in effect (meaning this test can be used) for the duration of the COVID-19 declaration under Section 564(b)(1) of the Act, 21 U.S.C. section 360bbb-3(b)(1), unless the authorization is terminated or revoked sooner. Performed at [facility], [place] [employer] [employer] [address], [place], [place] [address]   Culture, blood (routine x 2) Call MD if unable to obtain prior to antibiotics being given     Status: None (Preliminary result)   Collection Time: 12/22/18 11:46 PM   Specimen: BLOOD  Result Value Ref Range Status   Specimen Description   Final    BLOOD BLOOD RIGHT ARM Performed at [place] [address], [place], [place] [address]    Special Requests   Final    BOTTLES DRAWN AEROBIC AND ANAEROBIC Blood Culture adequate volume Performed at [place] [address] [place], [place] [address]    Culture   Final    NO GROWTH 4 DAYS Performed at [facility], [place] [address] [place], [place] [address]    Report Status PENDING  Incomplete  Culture, blood (routine x 2) Call MD if unable to obtain prior to antibiotics being given     Status: Abnormal   Collection Time: 12/22/18 11:46 PM   Specimen: BLOOD  Result Value Ref Range Status   Specimen Description   Final    BLOOD BLOOD LEFT FOREARM Performed at [place] [facility] [place] [address], [place], [place] [address]    Special Requests   Final    BOTTLES DRAWN AEROBIC AND ANAEROBIC Blood Culture adequate volume Performed at [facility] [address] [place], [place] [address]    Culture  Setup Time   Final    GRAM POSITIVE COCCI IN CHAINS IN PAIRS IN BOTH AEROBIC AND ANAEROBIC BOTTLES CRITICAL RESULT CALLED TO, READ BACK BY AND VERIFIED WITH: Colin Rhein PHARMD 2233 12/23/18 A BROWNING    Culture (A)  Final    VIRIDANS STREPTOCOCCUS STAPHYLOCOCCUS SPECIES (COAGULASE NEGATIVE) THE SIGNIFICANCE OF ISOLATING THIS ORGANISM FROM A SINGLE SET OF BLOOD CULTURES WHEN MULTIPLE SETS ARE DRAWN IS UNCERTAIN. PLEASE NOTIFY THE MICROBIOLOGY DEPARTMENT WITHIN ONE WEEK IF SPECIATION AND SENSITIVITIES ARE REQUIRED. Performed at [facility], [place] [address] [place], [place] [address]    Report Status 12/26/2018 FINAL  Final  Blood Culture ID Panel (Reflexed)     Status: Abnormal   Collection Time: 12/22/18 11:46 PM  Result Value Ref Range Status   Enterococcus species NOT DETECTED NOT DETECTED Final   Listeria monocytogenes NOT DETECTED NOT DETECTED Final   Staphylococcus species NOT DETECTED NOT DETECTED Final   Staphylococcus aureus (BCID) NOT DETECTED NOT DETECTED Final    Streptococcus species DETECTED (A) NOT DETECTED Final    Comment: Not Enterococcus species, Streptococcus agalactiae, Streptococcus pyogenes, or Streptococcus pneumoniae. CRITICAL RESULT CALLED TO, READ BACK BY AND VERIFIED WITH: Colin Rhein PHARMD 2233 12/23/18 A BROWNING    Streptococcus agalactiae NOT DETECTED NOT DETECTED Final   Streptococcus pneumoniae NOT DETECTED NOT DETECTED Final   Streptococcus  pyogenes NOT DETECTED NOT DETECTED Final   Acinetobacter baumannii NOT DETECTED NOT DETECTED Final   Enterobacteriaceae species NOT DETECTED NOT DETECTED Final   Enterobacter cloacae complex NOT DETECTED NOT DETECTED Final   Escherichia coli NOT DETECTED NOT DETECTED Final   Klebsiella oxytoca NOT DETECTED NOT DETECTED Final   Klebsiella pneumoniae NOT DETECTED NOT DETECTED Final   Proteus species NOT DETECTED NOT DETECTED Final   Serratia marcescens NOT DETECTED NOT DETECTED Final   Haemophilus influenzae NOT DETECTED NOT DETECTED Final   Neisseria meningitidis NOT DETECTED NOT DETECTED Final   Pseudomonas aeruginosa NOT DETECTED NOT DETECTED Final   Candida albicans NOT DETECTED NOT DETECTED Final   Candida glabrata NOT DETECTED NOT DETECTED Final   Candida krusei NOT DETECTED NOT DETECTED Final   Candida parapsilosis NOT DETECTED NOT DETECTED Final   Candida tropicalis NOT DETECTED NOT DETECTED Final    Comment: Performed at [facility], [place] [address], [place], [place] [address]  Culture, sputum-assessment     Status: None   Collection Time: 12/23/18 12:51 AM   Specimen: Expectorated Sputum  Result Value Ref Range Status   Specimen Description EXPECTORATED SPUTUM  Final   Special Requests NONE  Final   Sputum evaluation   Final    THIS SPECIMEN IS ACCEPTABLE FOR SPUTUM CULTURE Performed at [facility], [place] [address], [place], [place] [address]    Report Status 12/23/2018 FINAL  Final  Culture, respiratory     Status: None   Collection Time: 12/23/18  12:51 AM  Result Value Ref Range Status   Specimen Description   Final    EXPECTORATED SPUTUM Performed at [place] [address] [place], [place] [address]    Special Requests   Final    NONE Reflexed from [id number] Performed at [place] [address] [place], [place] [address]    Gram Stain   Final    MODERATE WBC PRESENT,BOTH PMN AND MONONUCLEAR FEW GRAM POSITIVE COCCI IN PAIRS RARE GRAM POSITIVE RODS    Culture   Final    MODERATE Consistent with normal respiratory flora. Performed at [facility], [place] [address] [place], [place] [address]    Report Status 12/25/2018 FINAL  Final  Expectorated sputum assessment w rflx to resp cult     Status: None   Collection Time: 12/23/18 11:31 AM   Specimen: Sputum  Result Value Ref Range Status   Specimen Description SPUTUM  Final   Special Requests NONE  Final   Sputum evaluation   Final    THIS SPECIMEN IS ACCEPTABLE FOR SPUTUM CULTURE Performed at [facility], [place] [address] [place], [place] [address]    Report Status 12/23/2018 FINAL  Final  Culture, respiratory     Status: None   Collection Time: 12/23/18 11:31 AM   Specimen: SPU  Result Value Ref Range Status   Specimen Description   Final    SPUTUM Performed at [place] [address] [place], [place] [address]    Special Requests   Final    NONE Reflexed from [id number] Performed at [facility], [place] [address], [place], [place] [address]    Gram Stain   Final    ABUNDANT WBC PRESENT,BOTH PMN AND MONONUCLEAR MODERATE GRAM POSITIVE COCCI FEW GRAM VARIABLE ROD RARE YEAST    Culture   Final    MODERATE Consistent with normal respiratory flora. Performed at [facility], [place] [address], [place], [place] [address]    Report Status 12/26/2018 FINAL  Final  Acid Fast Smear (AFB)     Status: None   Collection Time: 12/24/18  2:39 PM   Specimen:  Sputum  Result Value Ref Range Status   AFB Specimen Processing Concentration  Final   Acid Fast Smear Negative  Final    Comment: (NOTE) Performed At: [facility] [place], [place] [id number] Rush Farmer MD PE:[id number]    Source (AFB) SPU  Final    Comment: Performed at [facility], [place] [address] [place], [place] [address]  Culture, blood (routine x 2)     Status: None (Preliminary result)   Collection Time: 12/26/18 12:21 AM   Specimen: BLOOD  Result Value Ref Range Status   Specimen Description   Final    BLOOD RIGHT ARM Performed at [place] [address] [place], [place] [address]    Special Requests   Final    BOTTLES DRAWN AEROBIC AND ANAEROBIC Blood Culture adequate volume Performed at [place] [address], [place], [place] [address]    Culture   Final    NO GROWTH 1 DAY Performed at [facility], [place] [address] [place], [place] [address]    Report Status PENDING  Incomplete  Culture, blood (routine x 2)     Status: None (Preliminary result)   Collection Time: 12/26/18 12:21 AM   Specimen: BLOOD  Result Value Ref Range Status   Specimen Description   Final    BLOOD RIGHT WRIST Performed at [place] [address], [place], [place] [address]    Special Requests   Final    BOTTLES DRAWN AEROBIC AND ANAEROBIC Blood Culture adequate volume Performed at [place] [address], [place], [place] [address]    Culture   Final    NO GROWTH 1 DAY Performed at [facility], [place] [address], [place], [place] [address]    Report Status PENDING  Incomplete    Michel Bickers, MD [facility] for Infectious [place] [facility] [phone] pager   [phone] cell 12/27/2018, 10:35 AM

## 2018-12-27 NOTE — Progress Notes (Signed)
Occupational Therapy Treatment Patient Details Name: [name] MRN: [id number] DOB: [birth year] Today's Date: 12/27/2018    History of present illness 85 yo female admitted with cavitary pna. Hx of COPD-O2 dep, recurrent pna, chronic aspiration, vocal cord paralysis, anorexia, Hashimoto's thyroiditis   OT comments  Pt is making slow progress.  Continues to be weak, but participated in ADL and requested to lie back in bed.  Feel she will need assistance at home for mobility and adls.    Follow Up Recommendations  Supervision/Assistance - 24 hour;Home health OT. Would benefit from SNF, but pt wants home    Equipment Recommendations  None recommended by OT    Recommendations for Other Services      Precautions / Restrictions Precautions Precautions: Fall Precaution Comments: O2 dep Restrictions Weight Bearing Restrictions: No       Mobility Bed Mobility         Supine to sit: Supervision Sit to supine: Supervision   General bed mobility comments: HOB raised  Transfers   Equipment used: Rolling walker (2 wheeled)   Sit to Stand: Min guard;Min assist         General transfer comment: needed a little assistance to stand 1 of 2 times    Balance                                           ADL either performed or assessed with clinical judgement   ADL               Lower Body Bathing: Min guard       Lower Body Dressing: Min guard;Minimal assistance                 General ADL Comments: performed ADL from EOB.  Pt did not want to sit up in chair at this time, but states that she will get up for lunch. She still feels weak.  Educated on energy conservation and performed bathing/dressing.  Encouraged rest breaks. Pt did need a little assistance to stand from bed the second time, when we were changing underwear     Vision       Perception     Praxis      Cognition Arousal/Alertness: Awake/alert Behavior During Therapy:  WFL for tasks assessed/performed Overall Cognitive Status: Within Functional Limits for tasks assessed                                          Exercises     Shoulder Instructions       General Comments      Pertinent Vitals/ Pain       Pain Assessment: No/denies pain  Home Living                                          Prior Functioning/Environment              Frequency  Min 2X/week        Progress Toward Goals  OT Goals(current goals can now be found in the care plan section)  Progress towards OT goals: Progressing toward goals(slowly)     Plan      Co-evaluation  AM-PAC OT "6 Clicks" Daily Activity     Outcome Measure   Help from another person eating meals?: None Help from another person taking care of personal grooming?: A Little Help from another person toileting, which includes using toliet, bedpan, or urinal?: A Little Help from another person bathing (including washing, rinsing, drying)?: A Little Help from another person to put on and taking off regular upper body clothing?: A Little Help from another person to put on and taking off regular lower body clothing?: A Little 6 Click Score: 19    End of Session    OT Visit Diagnosis: Muscle weakness (generalized) (M62.81)   Activity Tolerance Patient limited by fatigue   Patient Left in bed;with call bell/phone within reach;with bed alarm set   Nurse Communication          Time: 4332-9518 OT Time Calculation (min): 19 min  Charges: OT General Charges $OT Visit: 1 Visit OT Treatments $Self Care/Home Management : 8-22 mins  Lesle Chris, OTR/L Acute Rehabilitation Services [phone] [facility] pager [phone] office 12/27/2018   [place] 12/27/2018, 11:34 AM

## 2018-12-28 LAB — CULTURE, BLOOD (ROUTINE X 2)
Culture: NO GROWTH
Special Requests: ADEQUATE

## 2018-12-28 MED ORDER — GUAIFENESIN 100 MG/5ML PO SOLN: 5.0000 mL | ORAL | 0 refills | Status: AC | PRN

## 2018-12-28 MED ORDER — ENSURE ENLIVE PO LIQD: 237.0000 mL | Freq: Two times a day (BID) | ORAL | 12 refills | Status: AC

## 2018-12-28 MED ORDER — AMOXICILLIN-POT CLAVULANATE 875-125 MG PO TABS: 1.0000 | ORAL_TABLET | Freq: Two times a day (BID) | ORAL | 0 refills | Status: DC

## 2018-12-28 MED ORDER — IPRATROPIUM-ALBUTEROL 0.5-2.5 (3) MG/3ML IN SOLN: 3.0000 mL | Freq: Three times a day (TID) | RESPIRATORY_TRACT | 0 refills | Status: AC

## 2018-12-28 NOTE — Discharge Summary (Signed)
Physician Discharge Summary  [name] IOE:[id number] DOB: 19-Aug-1930 DOA: 12/22/2018  PCP: Lujean Amel, MD  Admit date: 12/22/2018 Discharge date: 12/28/2018  Admitted From: Home  Disposition: Home   Recommendations for Outpatient Follow-up:  1. Follow up with PCP in 1-2 weeks 2. Please obtain BMP/CBC in one week 3. Please follow up on the following pending results: culture for AFB.  4. Follow up with ENT, GI for further evaluation of dysphagia.  5. Follow up with ID for further care of cavitary PNA  Home Health: yes.   Discharge Condition; Stable.  CODE STATUS: Full code Diet recommendation; Dysphagia 3 diet   Brief/Interim Summary: -year-old with past medical history significant for bronchiectasis, COPD, recurrent pneumonia, hypertension, hypothyroidism, Hashimoto thyroiditis and GERD with vocal cord paralysis who presented to the ER with significant shortness of breath and cough.  In the ER she was noted to be hypoxic.  CT chest showed cavitary pneumonia with multiloculated.  COVID screen negative.  Patient was admitted for IV antibiotics.  She was also found to have strep viridans bacteremia.  ID and pulmonologist has been consulted. AFB was ordered.  Patient has a history of MAC  1-Acute on chronic hypoxic respiratory failure secondary to cavitary pneumonia, chronic bronchiectasis: Probably related to aspiration pneumonia, history of vocal cord paralysis. Evaluated by infectious disease and pulmonologist.  They are recommending to check a sputum for AFB pending at this time. SIRS coronavirus 2-. -Modified barium evaluation: Consistent with healed dysphagia and mild pharyngeal dysphagia.  Patient recommended dysphagia mechanical soft diet, home medication with pure. - Discharge on amoxicillin clavulanate. Needs treatment for at least 2 weeks and serial chest x ray to determine optimal duration treatment.  Remain stable on oral antibiotics, plan to discharge today.   Daughter updated.   2-Strep viridans bacteremia: Source could be cavitary pneumonia. Echocardiogram; normal ejection fraction no evidence of endocarditis. Appreciate Dr. Megan Salon evaluation and recommendation. Treated with  Unasyn Repeated blood culture no growth to date.   Hypokalemia hypomagnesemia replace. Labs pending for the morning.   Hypothyroidism: Continue with Synthroid  COPD: Stable continue with Pulmicort  Hypertension continue with Norvasc  GERD continue with PPI changed to twice daily  Hyponatremia improved  Stage II sacral pressure injury POA.  Continue with wound care  Constipation continue with MiraLAX and Senokot  Severe protein calorie malnutrition Continue with Ensure  Discharge Diagnoses:  Principal Problem:   Cavitary pneumonia Active Problems:   Vocal cord paresis   Bronchiectasis with acute exacerbation (HCC)   GERD (gastroesophageal reflux disease)   Acute on chronic respiratory failure with hypoxia (HCC)   Hyponatremia   Essential hypertension   COPD with acute exacerbation (HCC)   Depression with anxiety   Hypothyroidism   Pressure injury of skin   Chronic obstructive pulmonary disease (HCC)   Bacteremia due to Streptococcus   History of Mycobacterium avium intracellulare infection   Unspecified severe protein-calorie malnutrition (HCC)   Aortic atherosclerosis (HCC)   Normocytic anemia   Protein-calorie malnutrition, severe    Discharge Instructions  Discharge Instructions    Diet - low sodium heart healthy   Complete by: As directed    Increase activity slowly   Complete by: As directed      Allergies as of 12/28/2018      Reactions   Ambien [zolpidem]    Foggy mind-gets up and walks around (pt does not remember anything of the event)   Asa [aspirin] Other (See Comments)   Spits up blood  Boniva [ibandronic Acid] Other (See Comments)   GI upset   Cystex [germanium] Nausea Only   Doxycycline Nausea Only,  Hypertension      Medication List    STOP taking these medications   irbesartan 300 MG tablet Commonly known as: AVAPRO     TAKE these medications   AeroChamber MV inhaler Use as instructed   Albuterol Sulfate 108 (90 Base) MCG/ACT Aepb Commonly known as: ProAir RespiClick Inhale 2 puffs into the lungs every 6 (six) hours as needed. What changed: reasons to take this   ALPRAZolam 0.5 MG tablet Commonly known as: XANAX Take 0.5-1 mg by mouth See admin instructions. 1  mg every morning, 0.5-1 mg every night as needed for anxiety/sleep   amitriptyline 50 MG tablet Commonly known as: ELAVIL Take 50 mg by mouth at bedtime.   amLODipine 5 MG tablet Commonly known as: NORVASC Take 5 mg by mouth every evening.   amoxicillin-clavulanate 875-125 MG tablet Commonly known as: AUGMENTIN Take 1 tablet by mouth every 12 (twelve) hours for 14 days.   Azelastine-Fluticasone 137-50 MCG/ACT Susp Commonly known as: Dymista 1-2 puffs each nostril daily if needed What changed:   how much to take  how to take this  when to take this  reasons to take this  additional instructions   beclomethasone 40 MCG/ACT inhaler Commonly known as: Qvar RediHaler USE 2 INHALATIONS TWICE A DAY What changed:   how much to take  how to take this  when to take this  additional instructions   celecoxib 200 MG capsule Commonly known as: CELEBREX TAKE 1 CAPSULE TWICE A DAY What changed: when to take this   cholecalciferol 1000 units tablet Commonly known as: VITAMIN D Take 1,000 Units by mouth daily.   feeding supplement (ENSURE ENLIVE) Liqd Take 237 mLs by mouth 2 (two) times daily between meals.   guaiFENesin 100 MG/5ML Soln Commonly known as: ROBITUSSIN Take 5 mLs (100 mg total) by mouth every 4 (four) hours as needed for cough or to loosen phlegm.   Incruse Ellipta 62.5 MCG/INH Aepb Generic drug: umeclidinium bromide TAKE 1 PUFF BY MOUTH EVERY DAY What changed: See the new  instructions.   ipratropium-albuterol 0.5-2.5 (3) MG/3ML Soln Commonly known as: DUONEB Take 3 mLs by nebulization 3 (three) times daily.   levothyroxine 88 MCG tablet Commonly known as: SYNTHROID Take 88 mcg by mouth daily before breakfast.   multivitamin tablet Take 1 tablet by mouth every Monday, Wednesday, and Friday.   omeprazole 20 MG capsule Commonly known as: PRILOSEC TAKE 1 CAPSULE DAILY   sertraline 50 MG tablet Commonly known as: ZOLOFT Take 50 mg by mouth daily at 12 noon.            Durable Medical Equipment  (From admission, onward)         Start     Ordered   12/28/18 0933  For home use only DME Nebulizer machine  Once    Question Answer Comment  Patient needs a nebulizer to treat with the following condition COPD (chronic obstructive pulmonary disease) ([place])   Length of Need Lifetime      12/28/18 0933         Follow-up Information    Michel Bickers, MD Follow up in 1 week(s).   Specialty: Infectious Diseases Why: 01-06-2019.  Contact information: [address] [place] [address] [phone]        Koirala, Dibas, MD Follow up in 1 week(s).   Specialty:  Family Medicine Contact information: [address] [place] [address] [phone]          Allergies  Allergen Reactions  . Ambien [Zolpidem]     Foggy mind-gets up and walks around (pt does not remember anything of the event)  . Asa [Aspirin] Other (See Comments)    Spits up blood  . Boniva [Ibandronic Acid] Other (See Comments)    GI upset  . Cystex [Germanium] Nausea Only  . Doxycycline Nausea Only and Hypertension    Consultations:  ID   Procedures/Studies: Dg Chest 2 View  Result Date: 12/22/2018 CLINICAL DATA:  Shortness of Breath, cough EXAM: CHEST - 2 VIEW COMPARISON:  05/30/2018 FINDINGS: Severe chronic lung disease with bronchiectasis and fibrosis. Increasing opacity in the left upper lobe, cannot exclude left upper  lobe infiltrate/pneumonia. Hyperinflation. Heart is borderline in size. Small left effusion. No acute bony abnormality. IMPRESSION: Severe chronic lung disease with bronchiectasis and fibrosis.  COPD. Borderline heart size. Increasing opacity within the left upper lobe. While this could reflect worsening scarring, cannot exclude acute infiltrate/pneumonia. Small left effusion. Electronically Signed   By: Rolm Baptise M.D.   On: 12/22/2018 20:05   Ct Chest Wo Contrast  Result Date: 12/22/2018 CLINICAL DATA:  85 y/o F; Bronchiectasis, acute exacerbation Shortness of breath. EXAM: CT CHEST WITHOUT CONTRAST TECHNIQUE: Multidetector CT imaging of the chest was performed following the standard protocol without IV contrast. COMPARISON:  07/17/2017 CT chest. FINDINGS: Cardiovascular: Normal caliber thoracic aorta and main pulmonary artery. Aortic calcific atherosclerosis. Normal heart size. No pericardial effusion. Mediastinum/Nodes: No enlarged mediastinal or axillary lymph nodes. Thyroid gland, trachea, and esophagus demonstrate no significant findings. Lungs/Pleura: Stable severe cylindrical and varicoid bronchiectasis throughout the lungs. Interval increased diffuse peribronchial wall thickening. Interval development of multiloculated thick-walled pulmonary cavities within the left upper lobe, the largest measuring 34 x 40 x 39 mm (AP x ML x CC series 6, image 49 and series 5, image 28). Partial resolution of ground-glass opacities and patchy consolidations elsewhere throughout the lung. Stable findings of postinflammatory scarring greatest in the lung bases. Upper Abdomen: No acute abnormality. Musculoskeletal: Cachexia. No acute osseous abnormality identified. IMPRESSION: 1. Interval development of multiloculated thick-walled pulmonary cavities within the left upper lobe, measuring up to 4 cm. Findings probably represent cavitary pneumonia. Underlying malignancy cannot be excluded. 2. Partial resolution of  ground-glass opacities and patchy consolidations elsewhere throughout the lungs. 3. Stable severe cylindrical and varicoid bronchiectasis throughout the lungs. 4. Interval increased diffuse peribronchial thickening which may represent acute on chronic bronchitis. Electronically Signed   By: Kristine Garbe M.D.   On: 12/22/2018 21:20   Dg Swallowing Func-speech Pathology  Result Date: 12/24/2018 Objective Swallowing Evaluation: Type of Study: MBS-Modified Barium Swallow Study  Patient Details Name: CORITA ALLINSON MRN: [id number] Date of Birth: Dec 18, 1930 Today's Date: 12/24/2018 Time: SLP Start Time (ACUTE ONLY): 1405 -SLP Stop Time (ACUTE ONLY): 1440 SLP Time Calculation (min) (ACUTE ONLY): 35 min Past Medical History: Past Medical History: Diagnosis Date . Bronchiectasis  . COPD (chronic obstructive pulmonary disease) ([place])  . Depression with anxiety  . Essential hypertension  . GERD (gastroesophageal reflux disease)  . Hashimoto's thyroiditis  . Hypothyroidism  . Vocal cord paralysis  Past Surgical History: Past Surgical History: Procedure Laterality Date . APPENDECTOMY   . HEMORRHOID SURGERY   . hysteroscopy x 2   . THYROIDECTOMY   . TUBAL LIGATION   HPI: 85 yo female adm to [facility] with pna after daughter  noted she has AMS via phone.  Pt found to have a cavitary lesion.  PMH + for COPD, chronic aspiration, GERD, recurrent pna, depression anxiety, Hashimoto's s/p thyroidectomy with subsequent vocal cord paralysis.  She has previously seen ENT Dr Janace Hoard who offered to stent her vocal fold but she declined per Dr Janee Morn notes from approximately 2015.  Pt admits she has dysphagia for several years causing her to "choke" - meaning "cough" during and after intake. She admits coughing extensively that she refluxed and aspirated prior to admission.  She does take Prilosec and thus reports no burning with refluxing.  Pt admits to "choking" on food and drink - mostly food but also water.  She reports taking  medicine with applesauce as if she does not she may choke.  Pt's prior imaging studies have shown patulous thoracic esophagus with fluid line concerning for esophageal dysmotility and/or reflux.  Subjective: pt awake in chair Assessment / Plan / Recommendation CHL IP CLINICAL IMPRESSIONS 12/24/2018 Clinical Impression Patient presents with mild pharyngeal - and suspected primary esophageal dysphagia.  She demonstrates decreased tongue base retraction which results in pharyngeal residuals.  In addition, very TRACE aspiration of thin mixed with secretions observed due to decreased laryngeal closure.  Chin tuck posture nor head turn left nor right helpful to prevent laryngeal penetration or residuals.  And chin tuck posture WORSENED airway protection as it allowed spillage of barium into open larynx from pyriform sinuses.  Pt reflexive dry swallows and liquid swallows decrease residuals in pharynx.  However, pt does not consistently sense pharyngeal residuals thus recommend strict aspiration precautions including following solids with liquids.  Pt able to "hock" and expectorate viscous pudding barium from vallecular region.  Trace laryngeal penetration with nectar was squeezed out with the same swallow.  Suspect pt's primary aspiration risk is esophageal however mitigation strategies in place for pharyngeal deficits also. SLP Visit Diagnosis Dysphagia, pharyngoesophageal phase (R13.14);Dysphagia, unspecified (R13.10) Attention and concentration deficit following -- Frontal lobe and executive function deficit following -- Impact on safety and function Moderate aspiration risk   CHL IP TREATMENT RECOMMENDATION 12/24/2018 Treatment Recommendations Therapy as outlined in treatment plan below   Prognosis 12/24/2018 Prognosis for Safe Diet Advancement Fair Barriers to Reach Goals Time post onset;Severity of deficits Barriers/Prognosis Comment -- CHL IP DIET RECOMMENDATION 12/24/2018 SLP Diet Recommendations Thin liquid;Dysphagia  3 (Mech soft) solids Liquid Administration via Cup;Straw Medication Administration Whole meds with puree Compensations Slow rate;Small sips/bites;Follow solids with liquid Postural Changes Seated upright at 90 degrees;Remain semi-upright after after feeds/meals (Comment)   CHL IP OTHER RECOMMENDATIONS 12/24/2018 Recommended Consults -- Oral Care Recommendations Oral care BID Other Recommendations --   CHL IP FOLLOW UP RECOMMENDATIONS 12/24/2018 Follow up Recommendations Home health SLP   CHL IP FREQUENCY AND DURATION 12/24/2018 Speech Therapy Frequency (ACUTE ONLY) min 1 x/week Treatment Duration 1 week      CHL IP ORAL PHASE 12/24/2018 Oral Phase Impaired Oral - Pudding Teaspoon -- Oral - Pudding Cup -- Oral - Honey Teaspoon -- Oral - Honey Cup -- Oral - Nectar Teaspoon -- Oral - Nectar Cup WFL Oral - Nectar Straw -- Oral - Thin Teaspoon WFL;Piecemeal swallowing Oral - Thin Cup Piecemeal swallowing;WFL Oral - Thin Straw WFL;Piecemeal swallowing Oral - Puree -- Oral - Mech Soft -- Oral - Regular WFL Oral - Multi-Consistency -- Oral - Pill Other (Comment) Oral Phase - Comment --  CHL IP PHARYNGEAL PHASE 12/24/2018 Pharyngeal Phase Impaired Pharyngeal- Pudding Teaspoon -- Pharyngeal -- Pharyngeal- Pudding Cup --  Pharyngeal -- Pharyngeal- Honey Teaspoon -- Pharyngeal -- Pharyngeal- Honey Cup -- Pharyngeal -- Pharyngeal- Nectar Teaspoon -- Pharyngeal -- Pharyngeal- Nectar Cup Pharyngeal residue - valleculae;Penetration/Aspiration during swallow;WFL Pharyngeal Material enters airway, remains ABOVE vocal cords then ejected out Pharyngeal- Nectar Straw NT Pharyngeal -- Pharyngeal- Thin Teaspoon Pharyngeal residue - valleculae;Lateral channel residue Pharyngeal Material does not enter airway Pharyngeal- Thin Cup Penetration/Aspiration during swallow;Reduced laryngeal elevation;Reduced airway/laryngeal closure;Pharyngeal residue - valleculae;Lateral channel residue;Trace aspiration Pharyngeal Material enters airway, CONTACTS  cords and not ejected out;Material enters airway, passes BELOW cords without attempt by patient to eject out (silent aspiration);Other (Comment) Pharyngeal- Thin Straw Reduced laryngeal elevation;Reduced airway/laryngeal closure;Penetration/Aspiration during swallow;Trace aspiration;Pharyngeal residue - valleculae;Lateral channel residue Pharyngeal Material enters airway, passes BELOW cords without attempt by patient to eject out (silent aspiration) Pharyngeal- Puree Delayed swallow initiation-vallecula;Reduced epiglottic inversion;Pharyngeal residue - valleculae Pharyngeal Material does not enter airway Pharyngeal- Mechanical Soft Delayed swallow initiation-vallecula;Reduced epiglottic inversion;Reduced tongue base retraction;Pharyngeal residue - valleculae Pharyngeal Material does not enter airway Pharyngeal- Regular -- Pharyngeal -- Pharyngeal- Multi-consistency -- Pharyngeal -- Pharyngeal- Pill WFL Pharyngeal Material does not enter airway Pharyngeal Comment chin tuck posture nor head turn left nor right helpful to prevent laryngeal penetration or residuals, chin tuck posture WORSENED airway protection as it allowed spillage of barium into open larynx, pt initiated reflexive dry swallows decrease residuals in pharynx, pt does not consistently sense pharyngeal residuals thus cues to "hock" and to follow solids with liquids helpful to expectorate or clear; trace laryngeal penetration with nectar was cleared with same swallow  CHL IP CERVICAL ESOPHAGEAL PHASE 12/24/2018 Cervical Esophageal Phase Impaired Pudding Teaspoon -- Pudding Cup -- Honey Teaspoon -- Honey Cup -- Nectar Teaspoon -- Nectar Cup -- Nectar Straw -- Thin Teaspoon -- Thin Cup -- Thin Straw -- Puree -- Mechanical Soft -- Regular -- Multi-consistency -- Pill -- Cervical Esophageal Comment Upon esophageal sweep, pt appears with stasis in her esophagus WITHOUT sensation; nectar liquids vs thin did not appear to vary in clearance Luanna Salk, MS [place] Pager [phone] Office [place] 12/24/2018, 4:05 PM                 Subjective: She is sitting, eating breakfast. Feeling better. Cough improving.   Discharge Exam: Vitals:   12/28/18 0618 12/28/18 0831  BP: 129/70   Pulse: 99   Resp: 18   Temp: 98 F (36.7 C)   SpO2: 95% 99%     General: Pt is alert, awake, not in acute distress Cardiovascular: RRR, S1/S2 +, no rubs, no gallops Respiratory: CTA bilaterally, no wheezing, mild  rhonchi Abdominal: Soft, NT, ND, bowel sounds + Extremities: no edema, no cyanosis    The results of significant diagnostics from this hospitalization (including imaging, microbiology, ancillary and laboratory) are listed below for reference.     Microbiology: Recent Results (from the past 240 hour(s))  SARS Coronavirus 2 (CEPHEID - Performed in [place] hospital lab), Hosp Order     Status: None   Collection Time: 12/22/18  7:18 PM   Specimen: Nasopharyngeal Swab  Result Value Ref Range Status   SARS Coronavirus 2 NEGATIVE NEGATIVE Final    Comment: (NOTE) If result is NEGATIVE SARS-CoV-2 target nucleic acids are NOT DETECTED. The SARS-CoV-2 RNA is generally detectable in upper and lower  respiratory specimens during the acute phase of infection. The lowest  concentration of SARS-CoV-2 viral copies this assay can detect is 250  copies / mL. A negative result does  not preclude SARS-CoV-2 infection  and should not be used as the sole basis for treatment or other  patient management decisions.  A negative result may occur with  improper specimen collection / handling, submission of specimen other  than nasopharyngeal swab, presence of viral mutation(s) within the  areas targeted by this assay, and inadequate number of viral copies  (<250 copies / mL). A negative result must be combined with clinical  observations, patient history, and epidemiological information. If result is  POSITIVE SARS-CoV-2 target nucleic acids are DETECTED. The SARS-CoV-2 RNA is generally detectable in upper and lower  respiratory specimens dur ing the acute phase of infection.  Positive  results are indicative of active infection with SARS-CoV-2.  Clinical  correlation with patient history and other diagnostic information is  necessary to determine patient infection status.  Positive results do  not rule out bacterial infection or co-infection with other viruses. If result is PRESUMPTIVE POSTIVE SARS-CoV-2 nucleic acids MAY BE PRESENT.   A presumptive positive result was obtained on the submitted specimen  and confirmed on repeat testing.  While 2019 novel coronavirus  (SARS-CoV-2) nucleic acids may be present in the submitted sample  additional confirmatory testing may be necessary for epidemiological  and / or clinical management purposes  to differentiate between  SARS-CoV-2 and other Sarbecovirus currently known to infect humans.  If clinically indicated additional testing with an alternate test  methodology [phone]) is advised. The SARS-CoV-2 RNA is generally  detectable in upper and lower respiratory sp ecimens during the acute  phase of infection. The expected result is Negative. Fact Sheet for Patients:  StrictlyIdeas.no Fact Sheet for Healthcare Providers: BankingDealers.co.za This test is not yet approved or cleared by the [place] FDA and has been authorized for detection and/or diagnosis of SARS-CoV-2 by FDA under an Emergency Use Authorization (EUA).  This EUA will remain in effect (meaning this test can be used) for the duration of the COVID-19 declaration under Section 564(b)(1) of the Act, 21 U.S.C. section 360bbb-3(b)(1), unless the authorization is terminated or revoked sooner. Performed at [facility], [place] [address] [place], [place] [address]   Culture, blood (routine x 2) Call MD if  unable to obtain prior to antibiotics being given     Status: None (Preliminary result)   Collection Time: 12/22/18 11:46 PM   Specimen: BLOOD  Result Value Ref Range Status   Specimen Description   Final    BLOOD BLOOD RIGHT ARM Performed at [place] [facility] [address], [place], [place] [address]    Special Requests   Final    BOTTLES DRAWN AEROBIC AND ANAEROBIC Blood Culture adequate volume Performed at [place] [address], [place], [place] [address]    Culture   Final    NO GROWTH 4 DAYS Performed at [facility], [place] [address], [place], [place] [address]    Report Status PENDING  Incomplete  Culture, blood (routine x 2) Call MD if unable to obtain prior to antibiotics being given     Status: Abnormal   Collection Time: 12/22/18 11:46 PM   Specimen: BLOOD  Result Value Ref Range Status   Specimen Description   Final    BLOOD BLOOD LEFT FOREARM Performed at [place] [address], [place], [place] [address]    Special Requests   Final    BOTTLES DRAWN AEROBIC AND ANAEROBIC Blood Culture adequate volume Performed at [facility], [address] [place], [place] [address]    Culture  Setup Time   Final    GRAM POSITIVE COCCI IN CHAINS IN PAIRS IN BOTH AEROBIC AND ANAEROBIC BOTTLES CRITICAL RESULT CALLED TO, READ BACK BY AND VERIFIED WITH: Colin Rhein PHARMD 2233 12/23/18 A BROWNING    Culture (A)  Final    VIRIDANS STREPTOCOCCUS STAPHYLOCOCCUS SPECIES (COAGULASE NEGATIVE) THE SIGNIFICANCE OF ISOLATING THIS ORGANISM FROM A SINGLE SET OF BLOOD CULTURES WHEN MULTIPLE SETS ARE DRAWN IS UNCERTAIN. PLEASE NOTIFY THE MICROBIOLOGY DEPARTMENT WITHIN ONE WEEK IF SPECIATION AND SENSITIVITIES ARE REQUIRED. Performed at [facility], [place] [address] [place], [place] [address]    Report Status 12/26/2018 FINAL  Final  Blood Culture ID Panel (Reflexed)     Status: Abnormal   Collection Time:  12/22/18 11:46 PM  Result Value Ref Range Status   Enterococcus species NOT DETECTED NOT DETECTED Final   Listeria monocytogenes NOT DETECTED NOT DETECTED Final   Staphylococcus species NOT DETECTED NOT DETECTED Final   Staphylococcus aureus (BCID) NOT DETECTED NOT DETECTED Final   Streptococcus species DETECTED (A) NOT DETECTED Final    Comment: Not Enterococcus species, Streptococcus agalactiae, Streptococcus pyogenes, or Streptococcus pneumoniae. CRITICAL RESULT CALLED TO, READ BACK BY AND VERIFIED WITH: Colin Rhein PHARMD 2233 12/23/18 A BROWNING    Streptococcus agalactiae NOT DETECTED NOT DETECTED Final   Streptococcus pneumoniae NOT DETECTED NOT DETECTED Final   Streptococcus pyogenes NOT DETECTED NOT DETECTED Final   Acinetobacter baumannii NOT DETECTED NOT DETECTED Final   Enterobacteriaceae species NOT DETECTED NOT DETECTED Final   Enterobacter cloacae complex NOT DETECTED NOT DETECTED Final   Escherichia coli NOT DETECTED NOT DETECTED Final   Klebsiella oxytoca NOT DETECTED NOT DETECTED Final   Klebsiella pneumoniae NOT DETECTED NOT DETECTED Final   Proteus species NOT DETECTED NOT DETECTED Final   Serratia marcescens NOT DETECTED NOT DETECTED Final   Haemophilus influenzae NOT DETECTED NOT DETECTED Final   Neisseria meningitidis NOT DETECTED NOT DETECTED Final   Pseudomonas aeruginosa NOT DETECTED NOT DETECTED Final   Candida albicans NOT DETECTED NOT DETECTED Final   Candida glabrata NOT DETECTED NOT DETECTED Final   Candida krusei NOT DETECTED NOT DETECTED Final   Candida parapsilosis NOT DETECTED NOT DETECTED Final   Candida tropicalis NOT DETECTED NOT DETECTED Final    Comment: Performed at [facility], [place]. [address], [place], [place] [address]  Culture, sputum-assessment     Status: None   Collection Time: 12/23/18 12:51 AM   Specimen: Expectorated Sputum  Result Value Ref Range Status   Specimen Description EXPECTORATED SPUTUM  Final   Special Requests NONE   Final   Sputum evaluation   Final    THIS SPECIMEN IS ACCEPTABLE FOR SPUTUM CULTURE Performed at [facility], [place] [address] [place], [place] [address]    Report Status 12/23/2018 FINAL  Final  Culture, respiratory     Status: None   Collection Time: 12/23/18 12:51 AM  Result Value Ref Range Status   Specimen Description   Final    EXPECTORATED SPUTUM Performed at [place] [address], [place],  [address]    Special Requests   Final    NONE Reflexed from [id number] Performed at [place] [address], [place], [place] [address]    Gram Stain   Final    MODERATE WBC PRESENT,BOTH PMN AND MONONUCLEAR FEW GRAM POSITIVE COCCI IN PAIRS RARE GRAM POSITIVE RODS  Culture   Final    MODERATE Consistent with normal respiratory flora. Performed at [facility], [place] [address] [place], [place] [address]    Report Status 12/25/2018 FINAL  Final  Expectorated sputum assessment w rflx to resp cult     Status: None   Collection Time: 12/23/18 11:31 AM   Specimen: Sputum  Result Value Ref Range Status   Specimen Description SPUTUM  Final   Special Requests NONE  Final   Sputum evaluation   Final    THIS SPECIMEN IS ACCEPTABLE FOR SPUTUM CULTURE Performed at [facility], [place] [address], [place], [place] [address]    Report Status 12/23/2018 FINAL  Final  Culture, respiratory     Status: None   Collection Time: 12/23/18 11:31 AM   Specimen: SPU  Result Value Ref Range Status   Specimen Description   Final    SPUTUM Performed at [place] [address] [place], [place], [place] [address]    Special Requests   Final    NONE Reflexed from [id number] Performed at [facility], [place] [address] [place], [place] [address]    Gram Stain   Final    ABUNDANT WBC PRESENT,BOTH PMN AND MONONUCLEAR MODERATE GRAM POSITIVE COCCI FEW GRAM VARIABLE ROD RARE YEAST     Culture   Final    MODERATE Consistent with normal respiratory flora. Performed at [facility], [place] [address], [place], [place] [address]    Report Status 12/26/2018 FINAL  Final  Acid Fast Smear (AFB)     Status: None   Collection Time: 12/24/18  2:39 PM   Specimen: Sputum  Result Value Ref Range Status   AFB Specimen Processing Concentration  Final   Acid Fast Smear Negative  Final    Comment: (NOTE) Performed At: [facility] [place], [place] [id number] Rush Farmer MD [id number]    Source (AFB) SPU  Final    Comment: Performed at [facility], [place] [address], [place], [place] [address]  Culture, blood (routine x 2)     Status: None (Preliminary result)   Collection Time: 12/26/18 12:21 AM   Specimen: BLOOD  Result Value Ref Range Status   Specimen Description   Final    BLOOD RIGHT ARM Performed at [place] [address], [place], [place] [address]    Special Requests   Final    BOTTLES DRAWN AEROBIC AND ANAEROBIC Blood Culture adequate volume Performed at [place] [address], [place], [place] [address]    Culture   Final    NO GROWTH 1 DAY Performed at [facility], [place] [address] [place], [place] [address]    Report Status PENDING  Incomplete  Culture, blood (routine x 2)     Status: None (Preliminary result)   Collection Time: 12/26/18 12:21 AM   Specimen: BLOOD  Result Value Ref Range Status   Specimen Description   Final    BLOOD RIGHT WRIST Performed at [place] [address], [place], [place] [address]    Special Requests   Final    BOTTLES DRAWN AEROBIC AND ANAEROBIC Blood Culture adequate volume Performed at [place] [address] [place], [place] [address]    Culture   Final    NO GROWTH 1 DAY Performed at [facility], [place] [address], [place],  [address]    Report  Status  PENDING  Incomplete     Labs: BNP (last 3 results) Recent Labs    12/22/18 1918  BNP 338.2*   Basic Metabolic Panel: Recent Labs  Lab 12/22/18 2015 12/23/18 0402 12/24/18 0436 12/26/18 0803 12/27/18 1227  NA 133* 136 136 136 133*  K 3.6 2.8* 3.9 4.1 3.7  CL 90* 92* 95* 89* 89*  CO2 35* 36* 32 37* 34*  GLUCOSE 139* 120* 98 103* 111*  BUN _0 CREATININE 0.53 0.42* 0.36* 0.42* 0.43*  CALCIUM 8.2* 8.1* 7.9* 8.5* 8.4*  MG  --  1.5* 2.1  --   --    Liver Function Tests: Recent Labs  Lab 12/23/18 0402  AST 17  ALT 14  ALKPHOS 94  BILITOT 0.3  PROT 6.9  ALBUMIN 2.6*   No results for input(s): LIPASE, AMYLASE in the last 168 hours. No results for input(s): AMMONIA in the last 168 hours. CBC: Recent Labs  Lab 12/22/18 1918 12/24/18 0436 12/26/18 0803  WBC 10.5 9.4 9.3  NEUTROABS 7.7 6.7  --   HGB 10.6* 10.4* 11.0*  HCT 33.3* 33.8* 35.4*  MCV 88.6 90.9 89.6  PLT 178 293 330   Cardiac Enzymes: Recent Labs  Lab 12/22/18 1918  TROPONINI <0.03   BNP: Invalid input(s): POCBNP CBG: No results for input(s): GLUCAP in the last 168 hours. D-Dimer No results for input(s): DDIMER in the last 72 hours. Hgb A1c No results for input(s): HGBA1C in the last 72 hours. Lipid Profile No results for input(s): CHOL, HDL, LDLCALC, TRIG, CHOLHDL, LDLDIRECT in the last 72 hours. Thyroid function studies No results for input(s): TSH, T4TOTAL, T3FREE, THYROIDAB in the last 72 hours.  Invalid input(s): FREET3 Anemia work up No results for input(s): VITAMINB12, FOLATE, FERRITIN, TIBC, IRON, RETICCTPCT in the last 72 hours. Urinalysis No results found for: COLORURINE, APPEARANCEUR, [place], [place], GLUCOSEU, [place], [place], KETONESUR, PROTEINUR, UROBILINOGEN, NITRITE, LEUKOCYTESUR Sepsis Labs Invalid input(s): PROCALCITONIN,  WBC,  LACTICIDVEN Microbiology Recent Results (from the past 240 hour(s))  SARS Coronavirus 2 (CEPHEID - Performed in [place]  hospital lab), Hosp Order     Status: None   Collection Time: 12/22/18  7:18 PM   Specimen: Nasopharyngeal Swab  Result Value Ref Range Status   SARS Coronavirus 2 NEGATIVE NEGATIVE Final    Comment: (NOTE) If result is NEGATIVE SARS-CoV-2 target nucleic acids are NOT DETECTED. The SARS-CoV-2 RNA is generally detectable in upper and lower  respiratory specimens during the acute phase of infection. The lowest  concentration of SARS-CoV-2 viral copies this assay can detect is 250  copies / mL. A negative result does not preclude SARS-CoV-2 infection  and should not be used as the sole basis for treatment or other  patient management decisions.  A negative result may occur with  improper specimen collection / handling, submission of specimen other  than nasopharyngeal swab, presence of viral mutation(s) within the  areas targeted by this assay, and inadequate number of viral copies  (<250 copies / mL). A negative result must be combined with clinical  observations, patient history, and epidemiological information. If result is POSITIVE SARS-CoV-2 target nucleic acids are DETECTED. The SARS-CoV-2 RNA is generally detectable in upper and lower  respiratory specimens dur ing the acute phase of infection.  Positive  results are indicative of active infection with SARS-CoV-2.  Clinical  correlation with patient history and other diagnostic information is  necessary to determine patient infection status.  Positive results do  not rule out bacterial infection or co-infection with other viruses. If result is PRESUMPTIVE POSTIVE SARS-CoV-2 nucleic acids MAY BE PRESENT.   A presumptive positive result was obtained on the submitted specimen  and confirmed on repeat testing.  While 2019 novel coronavirus  (SARS-CoV-2) nucleic acids may be present in the submitted sample  additional confirmatory testing may be necessary for epidemiological  and / or clinical management purposes  to differentiate  between  SARS-CoV-2 and other Sarbecovirus currently known to infect humans.  If clinically indicated additional testing with an alternate test  methodology [phone]) is advised. The SARS-CoV-2 RNA is generally  detectable in upper and lower respiratory sp ecimens during the acute  phase of infection. The expected result is Negative. Fact Sheet for Patients:  StrictlyIdeas.no Fact Sheet for Healthcare Providers: BankingDealers.co.za This test is not yet approved or cleared by the [place] FDA and has been authorized for detection and/or diagnosis of SARS-CoV-2 by FDA under an Emergency Use Authorization (EUA).  This EUA will remain in effect (meaning this test can be used) for the duration of the COVID-19 declaration under Section 564(b)(1) of the Act, 21 U.S.C. section 360bbb-3(b)(1), unless the authorization is terminated or revoked sooner. Performed at [facility], [place] [address] [place], [place] [address]   Culture, blood (routine x 2) Call MD if unable to obtain prior to antibiotics being given     Status: None (Preliminary result)   Collection Time: 12/22/18 11:46 PM   Specimen: BLOOD  Result Value Ref Range Status   Specimen Description   Final    BLOOD BLOOD RIGHT ARM Performed at [place] [address], [place], [place] [address]    Special Requests   Final    BOTTLES DRAWN AEROBIC AND ANAEROBIC Blood Culture adequate volume Performed at [place] [address] [place], [place] [address]    Culture   Final    NO GROWTH 4 DAYS Performed at [facility], [place] [address] [place], [place] [address]    Report Status PENDING  Incomplete  Culture, blood (routine x 2) Call MD if unable to obtain prior to antibiotics being given     Status: Abnormal   Collection Time: 12/22/18 11:46 PM   Specimen: BLOOD  Result Value Ref Range Status   Specimen  Description   Final    BLOOD BLOOD LEFT FOREARM Performed at [place] [address], [place], [place] [address]    Special Requests   Final    BOTTLES DRAWN AEROBIC AND ANAEROBIC Blood Culture adequate volume Performed at [place] [facility] [address], [place], [place] [address]    Culture  Setup Time   Final    GRAM POSITIVE COCCI IN CHAINS IN PAIRS IN BOTH AEROBIC AND ANAEROBIC BOTTLES CRITICAL RESULT CALLED TO, READ BACK BY AND VERIFIED WITH: Colin Rhein PHARMD 2233 12/23/18 A BROWNING    Culture (A)  Final    VIRIDANS STREPTOCOCCUS STAPHYLOCOCCUS SPECIES (COAGULASE NEGATIVE) THE SIGNIFICANCE OF ISOLATING THIS ORGANISM FROM A SINGLE SET OF BLOOD CULTURES WHEN MULTIPLE SETS ARE DRAWN IS UNCERTAIN. PLEASE NOTIFY THE MICROBIOLOGY DEPARTMENT WITHIN ONE WEEK IF SPECIATION AND SENSITIVITIES ARE REQUIRED. Performed at [facility], [place] [address] [place], [place] [address]    Report Status 12/26/2018 FINAL  Final  Blood Culture ID Panel (Reflexed)     Status: Abnormal   Collection Time: 12/22/18 11:46 PM  Result Value Ref Range Status  Enterococcus species NOT DETECTED NOT DETECTED Final   Listeria monocytogenes NOT DETECTED NOT DETECTED Final   Staphylococcus species NOT DETECTED NOT DETECTED Final   Staphylococcus aureus (BCID) NOT DETECTED NOT DETECTED Final   Streptococcus species DETECTED (A) NOT DETECTED Final    Comment: Not Enterococcus species, Streptococcus agalactiae, Streptococcus pyogenes, or Streptococcus pneumoniae. CRITICAL RESULT CALLED TO, READ BACK BY AND VERIFIED WITH: Colin Rhein PHARMD 2233 12/23/18 A BROWNING    Streptococcus agalactiae NOT DETECTED NOT DETECTED Final   Streptococcus pneumoniae NOT DETECTED NOT DETECTED Final   Streptococcus pyogenes NOT DETECTED NOT DETECTED Final   Acinetobacter baumannii NOT DETECTED NOT DETECTED Final   Enterobacteriaceae species NOT DETECTED NOT DETECTED Final   Enterobacter cloacae  complex NOT DETECTED NOT DETECTED Final   Escherichia coli NOT DETECTED NOT DETECTED Final   Klebsiella oxytoca NOT DETECTED NOT DETECTED Final   Klebsiella pneumoniae NOT DETECTED NOT DETECTED Final   Proteus species NOT DETECTED NOT DETECTED Final   Serratia marcescens NOT DETECTED NOT DETECTED Final   Haemophilus influenzae NOT DETECTED NOT DETECTED Final   Neisseria meningitidis NOT DETECTED NOT DETECTED Final   Pseudomonas aeruginosa NOT DETECTED NOT DETECTED Final   Candida albicans NOT DETECTED NOT DETECTED Final   Candida glabrata NOT DETECTED NOT DETECTED Final   Candida krusei NOT DETECTED NOT DETECTED Final   Candida parapsilosis NOT DETECTED NOT DETECTED Final   Candida tropicalis NOT DETECTED NOT DETECTED Final    Comment: Performed at [facility], [place]. [address], [place], [place] [address]  Culture, sputum-assessment     Status: None   Collection Time: 12/23/18 12:51 AM   Specimen: Expectorated Sputum  Result Value Ref Range Status   Specimen Description EXPECTORATED SPUTUM  Final   Special Requests NONE  Final   Sputum evaluation   Final    THIS SPECIMEN IS ACCEPTABLE FOR SPUTUM CULTURE Performed at [facility], [place] [address] [place], [place] [address]    Report Status 12/23/2018 FINAL  Final  Culture, respiratory     Status: None   Collection Time: 12/23/18 12:51 AM  Result Value Ref Range Status   Specimen Description   Final    EXPECTORATED SPUTUM Performed at [place] [address], [place], [place] [address]    Special Requests   Final    NONE Reflexed from [id number] Performed at [place] [address] [place], [place] [address]    Gram Stain   Final    MODERATE WBC PRESENT,BOTH PMN AND MONONUCLEAR FEW GRAM POSITIVE COCCI IN PAIRS RARE GRAM POSITIVE RODS    Culture   Final    MODERATE Consistent with normal respiratory flora. Performed at [facility], [place] [address] [place], [place] [address]    Report Status 12/25/2018 FINAL  Final  Expectorated sputum assessment w rflx to resp cult     Status: None   Collection Time: 12/23/18 11:31 AM   Specimen: Sputum  Result Value Ref Range Status   Specimen Description SPUTUM  Final   Special Requests NONE  Final   Sputum evaluation   Final    THIS SPECIMEN IS ACCEPTABLE FOR SPUTUM CULTURE Performed at [facility], [place] [address] [place], [place] [address]    Report Status 12/23/2018 FINAL  Final  Culture, respiratory     Status: None   Collection Time: 12/23/18 11:31 AM   Specimen: SPU  Result Value Ref Range Status  Specimen Description   Final    SPUTUM Performed at [place] [address], [place], [place] [address]    Special Requests   Final    NONE Reflexed from [id number] Performed at [facility], [place] [address] [place], [place] [address]    Gram Stain   Final    ABUNDANT WBC PRESENT,BOTH PMN AND MONONUCLEAR MODERATE GRAM POSITIVE COCCI FEW GRAM VARIABLE ROD RARE YEAST    Culture   Final    MODERATE Consistent with normal respiratory flora. Performed at [facility], [place] [address], [place], [place] [address]    Report Status 12/26/2018 FINAL  Final  Acid Fast Smear (AFB)     Status: None   Collection Time: 12/24/18  2:39 PM   Specimen: Sputum  Result Value Ref Range Status   AFB Specimen Processing Concentration  Final   Acid Fast Smear Negative  Final    Comment: (NOTE) Performed At: [facility] [place], [place] [id number] Rush Farmer MD [id number]    Source (AFB) SPU  Final    Comment: Performed at [facility], [place] [address] [place], [place] [address]  Culture, blood (routine x 2)     Status: None (Preliminary result)   Collection Time: 12/26/18 12:21 AM   Specimen: BLOOD  Result Value Ref Range Status   Specimen Description   Final    BLOOD RIGHT  ARM Performed at [place] [address], [place], [place] [address]    Special Requests   Final    BOTTLES DRAWN AEROBIC AND ANAEROBIC Blood Culture adequate volume Performed at [place] [address], [place], [place] [address]    Culture   Final    NO GROWTH 1 DAY Performed at [facility], [place] [address] [place], [place] [address]    Report Status PENDING  Incomplete  Culture, blood (routine x 2)     Status: None (Preliminary result)   Collection Time: 12/26/18 12:21 AM   Specimen: BLOOD  Result Value Ref Range Status   Specimen Description   Final    BLOOD RIGHT WRIST Performed at [place] [address] [place], [place] [address]    Special Requests   Final    BOTTLES DRAWN AEROBIC AND ANAEROBIC Blood Culture adequate volume Performed at [place] [address] [place], [place] [address]    Culture   Final    NO GROWTH 1 DAY Performed at [facility], [place] [address] [place], [place] [address]    Report Status PENDING  Incomplete     Time coordinating discharge: 40 minutes  SIGNED:   Elmarie Shiley, MD  [facility]

## 2018-12-28 NOTE — TOC Progression Note (Signed)
Transition of Care [facility]) - Progression Note    Patient Details  Name: [name] MRN: [id number] Date of Birth: 1930/12/29  Transition [facility] Care [facility]) CM/SW Contact  Joaquin Courts, RN Phone Number: 12/28/2018, 10:35 AM  Clinical Narrative:    CM spoke with patient who reports her family will bring a portable O2 tank for her for d/c. CM set up DME nebulizer machine to be delivered to patient room prior to d/c home. HHPT/OR/RN/ Aide [facility] with [facility]   Expected Discharge Plan: [place] Barriers to Discharge: No Barriers Identified  Expected Discharge Plan and Services Expected Discharge Plan: [place]   Discharge Planning Services: CM Consult Post Acute Care Choice: [place] arrangements for the past 2 months: Single Family Home Expected Discharge Date: 12/28/18               DME Arranged: Nebulizer machine DME Agency: [employer] Date DME Agency Contacted: 12/28/18 Time DME Agency Contacted: 1034 Representative spoke with at DME Agency: [place]: RN, PT, OT, Nurse's Aide [place] Agency: [place] ([place]) Date [place]: 12/28/18 Time [place]: 1035 Representative spoke with at [place]: [place] ([place]) Interventions    Readmission Risk Interventions No flowsheet data found.

## 2018-12-28 NOTE — Progress Notes (Signed)
    Home health agencies that serve [id number].        [place] Quality of Patient Care Rating Patient Survey Summary Rating  [facility] [phone] 4 out of 5 stars 4 out of [place] [phone] 3 out of 5 stars 4 out of [place] [phone]) [phone] 4  out of 5 stars 3 out of [place] [phone] 4  out of 5 stars 4 out of [place] [phone] 4 out of 5 stars 4 out of [place] [phone] 4 out of 5 stars 4 out of 5 stars  [facility] [phone] 3  out of 5 stars 4 out of [place] [phone] 3 out of 5 stars 4 out of 5 stars  [facility] [phone] 4 out of 5 stars [facility] [place] [phone] 3 out of 5 stars 4 out of 5 stars  [facility] [phone] 3  out of 5 stars 3 out of [place] [phone] 3  out of 5 stars 4 out of [place] [phone] 3  out of 5 stars 3 out of [place] [phone] 3  out of 5 stars Not [place] [phone] 4  out of 5 stars 3 out of [place] number Footnote as displayed on [place]  1 This agency provides services under a federal waiver program to non-traditional, chronic long term population.  2 This agency provides services to a special needs population.  3 Not Available.  4 The number of patient episodes for this measure is too small to report.  5 This measure currently does not have data or provider has been certified/recertified for less than 6 months.  6 The national average for this measure is not provided because of state-to-state  differences in data collection.  7 Medicare is not displaying rates for this measure for any home health agency, because of an issue with the data.  8 There were problems with the data and they are being corrected.  9 Zero, or very few, patients met the survey's rules for inclusion. The scores shown, if any, reflect a very small number of surveys and may not accurately tell how an agency is doing.  10 Survey results are based on less than 12 months of data.  11 Fewer than 70 patients completed the survey. Use the scores shown, if any, with caution as the number of surveys may be too low to accurately tell how an agency is doing.  12 No survey results are available for this period.  13 Data suppressed by CMS for one or more quarters.

## 2018-12-28 NOTE — Plan of Care (Signed)
Pt's lab results have improved since admittance.

## 2018-12-28 NOTE — Discharge Instructions (Signed)
Diet; Dysphagia Diet 3.  The foods should all be easy to cut. You should avoid anything that is hard to chew.  Liquid Administration via: Cup;Straw   Medication Administration: Whole meds with puree(start and follow with liquids)   Supervision: Patient able to self feed   Compensations: Slow rate;Small sips/bites;Follow solids with liquid("hock" and expectorate at completion of po intake)   Postural Changes: Seated upright at 90 degrees;Remain semi-upright after after feeds/meals (Comment)   Oral Care Recommendations: Oral care BID

## 2018-12-31 LAB — CULTURE, BLOOD (ROUTINE X 2)
Culture: NO GROWTH
Culture: NO GROWTH
Special Requests: ADEQUATE
Special Requests: ADEQUATE

## 2019-01-02 ENCOUNTER — Telehealth: Payer: Self-pay | Admitting: Internal Medicine

## 2019-01-02 NOTE — Telephone Encounter (Signed)
COVID-19 Pre-Screening Questions: ° °Do you currently have a fever (>100 °F), chills or unexplained body aches? No  ° °Are you currently experiencing new cough, shortness of breath, sore throat, runny nose? No  °•  °Have you recently travelled outside the state of [place] in the last 14 days? No  °•  °1. Have you been in contact with someone that is currently pending confirmation of Covid19 testing or has been confirmed to have the Covid19 virus?  No  ° °

## 2019-01-06 ENCOUNTER — Ambulatory Visit (INDEPENDENT_AMBULATORY_CARE_PROVIDER_SITE_OTHER): Payer: Medicare Other | Admitting: Internal Medicine

## 2019-01-06 ENCOUNTER — Other Ambulatory Visit: Payer: Self-pay

## 2019-01-06 ENCOUNTER — Encounter: Payer: Self-pay | Admitting: Internal Medicine

## 2019-01-06 DIAGNOSIS — R7881 Bacteremia: Secondary | ICD-10-CM | POA: Diagnosis not present

## 2019-01-06 DIAGNOSIS — J189 Pneumonia, unspecified organism: Secondary | ICD-10-CM | POA: Diagnosis not present

## 2019-01-06 DIAGNOSIS — A310 Pulmonary mycobacterial infection: Secondary | ICD-10-CM

## 2019-01-06 DIAGNOSIS — J984 Other disorders of lung: Secondary | ICD-10-CM | POA: Diagnosis not present

## 2019-01-06 DIAGNOSIS — B955 Unspecified streptococcus as the cause of diseases classified elsewhere: Secondary | ICD-10-CM

## 2019-01-06 MED ORDER — ETHAMBUTOL HCL 100 MG PO TABS: 100.0000 mg | ORAL_TABLET | Freq: Every day | ORAL | 11 refills | Status: AC

## 2019-01-06 MED ORDER — AMOXICILLIN-POT CLAVULANATE 875-125 MG PO TABS: 1.0000 | ORAL_TABLET | Freq: Two times a day (BID) | ORAL | 0 refills | Status: AC

## 2019-01-06 MED ORDER — ETHAMBUTOL HCL 400 MG PO TABS: 400.0000 mg | ORAL_TABLET | Freq: Every day | ORAL | 11 refills | Status: AC

## 2019-01-06 MED ORDER — RIFAMPIN 300 MG PO CAPS: 600.0000 mg | ORAL_CAPSULE | Freq: Every day | ORAL | 11 refills | Status: AC

## 2019-01-06 MED ORDER — AZITHROMYCIN 200 MG/5ML PO SUSR: 250.0000 mg | Freq: Every day | ORAL | 11 refills | Status: AC

## 2019-01-06 NOTE — Assessment & Plan Note (Signed)
I suspect that her cavitary pneumonia is multifactorial in origin.  She has severe, chronic lung disease and probable chronic aspiration.  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

## 2019-01-06 NOTE — Assessment & Plan Note (Signed)
Antibiotic susceptibilities for her Mycobacterium avium are pending.  Although this may represent simple colonization I strongly suspect that it is a factor in her severe cavitary pneumonia.  I discussed treatment options.  They are in agreement with starting azithromycin, ethambutol and rifampin.  She will follow-up here in 1 month.

## 2019-01-06 NOTE — Assessment & Plan Note (Signed)
She will complete 4 more days of amoxicillin clavulanate.

## 2019-01-06 NOTE — Progress Notes (Signed)
[facility] for Infectious [facility]  Patient Active Problem List   Diagnosis Date Noted  . Bacteremia due to Streptococcus 12/24/2018    Priority: High  . Mycobacterium avium infection ([place]) 12/24/2018    Priority: High  . Cavitary pneumonia 12/22/2018    Priority: High  . Protein-calorie malnutrition, severe 12/27/2018  . Aortic atherosclerosis ([place]) 12/24/2018  . Normocytic anemia 12/24/2018  . Pressure injury of skin 12/23/2018  . Chronic obstructive pulmonary disease ([place])   . LBBB (left bundle branch block) 06/23/2017  . Essential hypertension   . Depression with anxiety   . Hypothyroidism   . GERD (gastroesophageal reflux disease) 03/30/2013  . HASHIMOTO'S THYROIDITIS 09/10/2007  . Vocal cord paresis 09/10/2007    Patient's Medications  New Prescriptions   AZITHROMYCIN (ZITHROMAX) 200 MG/5ML SUSPENSION    Take 6.3 mLs (250 mg total) by mouth daily.   ETHAMBUTOL (MYAMBUTOL) 100 MG TABLET    Take 1 tablet (100 mg total) by mouth daily.   ETHAMBUTOL (MYAMBUTOL) 400 MG TABLET    Take 1 tablet (400 mg total) by mouth daily.   RIFAMPIN (RIFADIN) 300 MG CAPSULE    Take 2 capsules (600 mg total) by mouth daily.  Previous Medications   ALBUTEROL SULFATE (PROAIR RESPICLICK) 725 (90 BASE) MCG/ACT AEPB    Inhale 2 puffs into the lungs every 6 (six) hours as needed.   ALPRAZOLAM (XANAX) 0.5 MG TABLET    Take 0.5-1 mg by mouth See admin instructions. 1  mg every morning, 0.5-1 mg every night as needed for anxiety/sleep   AMITRIPTYLINE (ELAVIL) 50 MG TABLET    Take 50 mg by mouth at bedtime.    AMLODIPINE (NORVASC) 5 MG TABLET    Take 5 mg by mouth every evening.    AZELASTINE-FLUTICASONE (DYMISTA) 137-50 MCG/ACT SUSP    1-2 puffs each nostril daily if needed   BECLOMETHASONE (QVAR REDIHALER) 40 MCG/ACT INHALER    USE 2 INHALATIONS TWICE A DAY   CELECOXIB (CELEBREX) 200 MG CAPSULE    TAKE 1 CAPSULE TWICE A DAY   CHOLECALCIFEROL (VITAMIN D) 1000 UNITS TABLET    Take 1,000  Units by mouth daily.   FEEDING SUPPLEMENT, ENSURE ENLIVE, (ENSURE ENLIVE) LIQD    Take 237 mLs by mouth 2 (two) times daily between meals.   GUAIFENESIN (ROBITUSSIN) 100 MG/5ML SOLN    Take 5 mLs (100 mg total) by mouth every 4 (four) hours as needed for cough or to loosen phlegm.   INCRUSE ELLIPTA 62.5 MCG/INH AEPB    TAKE 1 PUFF BY MOUTH EVERY DAY   IPRATROPIUM-ALBUTEROL (DUONEB) 0.5-2.5 (3) MG/3ML SOLN    Take 3 mLs by nebulization 3 (three) times daily.   LEVOTHYROXINE (SYNTHROID, LEVOTHROID) 88 MCG TABLET    Take 88 mcg by mouth daily before breakfast.    MULTIPLE VITAMIN (MULTIVITAMIN) TABLET    Take 1 tablet by mouth every Monday, Wednesday, and Friday.    OMEPRAZOLE (PRILOSEC) 20 MG CAPSULE    TAKE 1 CAPSULE DAILY   SERTRALINE (ZOLOFT) 50 MG TABLET    Take 50 mg by mouth daily at 12 noon.    SPACER/AERO-HOLDING CHAMBERS (AEROCHAMBER MV) INHALER    Use as instructed  Modified Medications   Modified Medication Previous Medication   AMOXICILLIN-CLAVULANATE (AUGMENTIN) 875-125 MG TABLET amoxicillin-clavulanate (AUGMENTIN) 875-125 MG tablet      Take 1 tablet by mouth every 12 (twelve) hours for 4 days.    Take 1 tablet by mouth  every 12 (twelve) hours for 14 days.  Discontinued Medications   No medications on file    Subjective: [name] is in for her hospital follow-up visit.  She is a 85 y.o. female with a long history of bronchiectasis, O2 dependent COPD, GERD and vocal cord paralysis.  She also has a remote history of treated Mycobacterium avium pneumonia.  I can find that documented in a note by Dr. Keturah Barre from a visit in June 2008 but no other confirmation in [facility].  She recalls that it was sometime in the 1970s.  She has initially thought to have tuberculosis then Mycobacterium avium was confirmed.  He says that she was on treatment for 4 months.    She says that she started to feel sick about 6 weeks ago.  She noted increased cough productive of dark brown sputum and  worsening shortness of breath.  She has not noted any unusual smell or taste to her sputum.  She said that she was scared to come to the hospital because of the [place] pandemic. Several weeks ago chronic for lower front teeth broke off.  It is not sore or sensitive.  She notes fairly frequent episodes of choking on food.  She kept getting worse and was finally admitted on 12/22/2018.  Cultures were obtained and she was started on empiric levofloxacin.  1 of 2 admission blood cultures grew viridans strep.  Sputum Gram stain revealed gram-positive rods, gram-positive cocci, gram variable rods and yeast.  Cultures grew normal oral flora.  CT scan showed new multiloculated cavitary pneumonia in the left upper lobe.  She has always lived in [place].  She was discharged home on oral amoxicillin clavulanate and is now completed 15 days of total therapy.  She is not feeling any better.  She has not had any fever, chills or sweats continues to be extremely weak with poor appetite.  She is still coughing up dark brown phlegm and is more short of breath than normal.  She has not had any problems tolerating her antibiotic.  Review of Systems: Review of Systems  Constitutional: Positive for weight loss. Negative for chills, diaphoresis, fever and malaise/fatigue.  Respiratory: Positive for cough, sputum production and shortness of breath. Negative for hemoptysis and wheezing.   Cardiovascular: Negative for chest pain.  Gastrointestinal: Negative for abdominal pain, diarrhea, nausea and vomiting.  Skin: Negative for rash.    Past Medical History:  Diagnosis Date  . Bronchiectasis   . COPD (chronic obstructive pulmonary disease) ([place])   . Depression with anxiety   . Essential hypertension   . GERD (gastroesophageal reflux disease)   . Hashimoto's thyroiditis   . Hypothyroidism   . Vocal cord paralysis     Social History   Tobacco Use  . Smoking status: Never Smoker  . Smokeless tobacco: Never  Used  Substance Use Topics  . Alcohol use: Not Currently  . Drug use: Never    Family History  Problem Relation Age of Onset  . COPD Father   . Hypertension Father     Allergies  Allergen Reactions  . Ambien [Zolpidem]     Foggy mind-gets up and walks around (pt does not remember anything of the event)  . Asa [Aspirin] Other (See Comments)    Spits up blood  . Boniva [Ibandronic Acid] Other (See Comments)    GI upset  . Cystex [Germanium] Nausea Only  . Doxycycline Nausea Only and Hypertension    Objective: Vitals:   01/06/19 1117  BP: 137/71  Pulse: 99  Temp: 99 F (37.2 C)  TempSrc: Oral   There is no height or weight on file to calculate BMI.  Physical Exam Constitutional:      Comments: When she was last weighed 2 weeks ago when she was 77 pounds down from her usual of around 90 pounds.  She is in good spirits but very weak.  She is seated in a wheelchair.  Cardiovascular:     Rate and Rhythm: Regular rhythm.     Heart sounds: No murmur.     Comments: She is tachycardic. Pulmonary:     Effort: Pulmonary effort is normal.     Breath sounds: Wheezing and rales present.     Comments: She is using supplemental nasal prong oxygen.  She has diffuse bilateral crackles and wheezes. Abdominal:     Palpations: Abdomen is soft.     Tenderness: There is no abdominal tenderness.  Skin:    Comments: She has a very small scabbed area right over the tip of her coccyx.  There is no open wound or sign of infection.  Psychiatric:        Mood and Affect: Mood normal.     Lab Results    Problem List Items Addressed This Visit      High   Mycobacterium avium infection ([place])    Antibiotic susceptibilities for her Mycobacterium avium are pending.  Although this may represent simple colonization I strongly suspect that it is a factor in her severe cavitary pneumonia.  I discussed treatment options.  They are in agreement with starting azithromycin, ethambutol and rifampin.   She will follow-up here in 1 month.      Relevant Medications   azithromycin (ZITHROMAX) 200 MG/5ML suspension   rifampin (RIFADIN) 300 MG capsule   ethambutol (MYAMBUTOL) 400 MG tablet   ethambutol (MYAMBUTOL) 100 MG tablet   Cavitary pneumonia    I suspect that her cavitary pneumonia is multifactorial in origin.  She has severe, chronic lung disease and probable chronic aspiration.  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       Relevant Medications   amoxicillin-clavulanate (AUGMENTIN) 875-125 MG tablet   azithromycin (ZITHROMAX) 200 MG/5ML suspension   rifampin (RIFADIN) 300 MG capsule   ethambutol (MYAMBUTOL) 400 MG tablet   ethambutol (MYAMBUTOL) 100 MG tablet   Bacteremia due to Streptococcus    She will complete 4 more days of amoxicillin clavulanate.          Michel Bickers, MD [facility] for [facility] [place] [facility] [phone] pager   [phone] cell 01/06/2019, 12:02 PM

## 2019-01-15 LAB — MAC SUSCEPTIBILITY BROTH
Amikacin: 16
Clarithromycin: 2
Linezolid: 32
Moxifloxacin: 4
Streptomycin: 64

## 2019-01-15 LAB — AFB ORGANISM ID BY DNA PROBE
M avium complex: POSITIVE — AB
M tuberculosis complex: NEGATIVE

## 2019-01-15 LAB — ACID FAST CULTURE WITH REFLEXED SENSITIVITIES (MYCOBACTERIA): Acid Fast Culture: POSITIVE — AB

## 2019-01-30 ENCOUNTER — Telehealth: Payer: Self-pay

## 2019-01-30 NOTE — Telephone Encounter (Signed)
PA initiated for Azithromycin. Approved. Case number [id number] effective 01/05/19-01/30/19.  Co payment of $10.81. Patient and pharmacy made aware.  Eugenia Mcalpine, LPN

## 2019-02-04 ENCOUNTER — Ambulatory Visit: Payer: Medicare Other | Admitting: Internal Medicine

## 2019-02-05 ENCOUNTER — Ambulatory Visit (INDEPENDENT_AMBULATORY_CARE_PROVIDER_SITE_OTHER): Payer: Medicare Other | Admitting: Internal Medicine

## 2019-02-05 ENCOUNTER — Other Ambulatory Visit: Payer: Self-pay

## 2019-02-05 DIAGNOSIS — A310 Pulmonary mycobacterial infection: Secondary | ICD-10-CM

## 2019-02-05 MED ORDER — ONDANSETRON 8 MG PO FILM: 8.0000 mg | ORAL_FILM | Freq: Two times a day (BID) | ORAL | 5 refills | Status: AC | PRN

## 2019-02-05 NOTE — Progress Notes (Signed)
Virtual Visit via Telephone Note  I connected with [name] on 02/05/19 at  2:15 PM EDT by telephone and verified that I am speaking with the correct person using two identifiers.  Location: Patient: Home Provider: [facility]   I discussed the limitations, risks, security and privacy concerns of performing an evaluation and management service by telephone and the availability of in person appointments. I also discussed with the patient that there may be a patient responsible charge related to this service. The patient expressed understanding and agreed to proceed.   History of Present Illness: I called and spoke with [name] and her daughter, [name], today.  She did start on ethambutol and azithromycin shortly after her last visit with me on 01/06/2019.  It took some time for her pharmacy to obtain rifampin.  She started it about 2-1/2 weeks ago.  She was only able to take it for 2 to 3 days because of severe nausea and vomiting.  She stopped it about 2 weeks ago and has not restarted it because she recently developed severe constipation.  She was able to have a small bowel movement this morning.  [name] says that she thinks that her mother's cough is a little better.   Observations/Objective:   Assessment and Plan: I have prescribed ondansetron and instructed them to take it about 30 minutes before bedtime and then to retry rifampin at bedtime along with her ethambutol and azithromycin.  I asked [name] to let me know if she cannot tolerate the rifampin at all even with premedication.  If she does not tolerate it there will be little benefit in continuing ethambutol and azithromycin alone.  We will have her come back and see me in about 6 weeks.  Follow Up Instructions: Continue ethambutol and azithromycin Rechallenge with rifampin after taking ondansetron Follow-up in 6 weeks   I discussed the assessment and treatment plan with the patient. The patient was provided an opportunity to ask  questions and all were answered. The patient agreed with the plan and demonstrated an understanding of the instructions.   The patient was advised to call back or seek an in-person evaluation if the symptoms worsen or if the condition fails to improve as anticipated.  I provided 14 minutes of non-face-to-face time during this encounter.   Michel Bickers, MD

## 2019-02-13 ENCOUNTER — Telehealth: Payer: Self-pay | Admitting: *Deleted

## 2019-02-13 NOTE — Telephone Encounter (Signed)
Received call from [facility] daughter [name].  [name] is still feeling poorly, has not been able to attempt to restart rifampin with her other meds.  She is now having difficulty tolerating azithromycin and ethambutol too.  She is still doing breathing treatments, but is only able to do them 1-2 times a day. She has increased pain, diminishing appetite. She drinks water and ensure well, but will stop eating after a few bites of applesauce. [name] spoke with Dr Versie Starks nurse today who has initiated hospice referral. [name] is hoping to keep her mom at home with hospice support.  They will stop the azithromycin and ethambutol, will keep her follow up appointment (by phone) on 9/16. Landis Gandy, RN

## 2019-02-17 NOTE — Telephone Encounter (Signed)
Thanks for checking on her.

## 2019-02-17 NOTE — Telephone Encounter (Signed)
I am in agreement with having her stop all Mycobacterium avium medications and initiating a hospice referral.

## 2019-02-17 NOTE — Telephone Encounter (Signed)
Hospice started 8/8. I spoke with her daughter today who said that the hospice nurse thinks [name] is declining quickly and will likely pass soon.

## 2019-03-11 DEATH — deceased

## 2019-03-26 ENCOUNTER — Ambulatory Visit: Payer: Medicare Other | Admitting: Internal Medicine

## 2019-07-02 IMAGING — CR DG SACRUM/COCCYX 2+V
3 series · 3 of 3 positions shown · non-contrast
Comparison: CT 03/27/2012.

CLINICAL DATA: Fall.  Pain.  Sciatica.

EXAM:
SACRUM AND COCCYX - 2+ VIEW

[t sacrum ap]
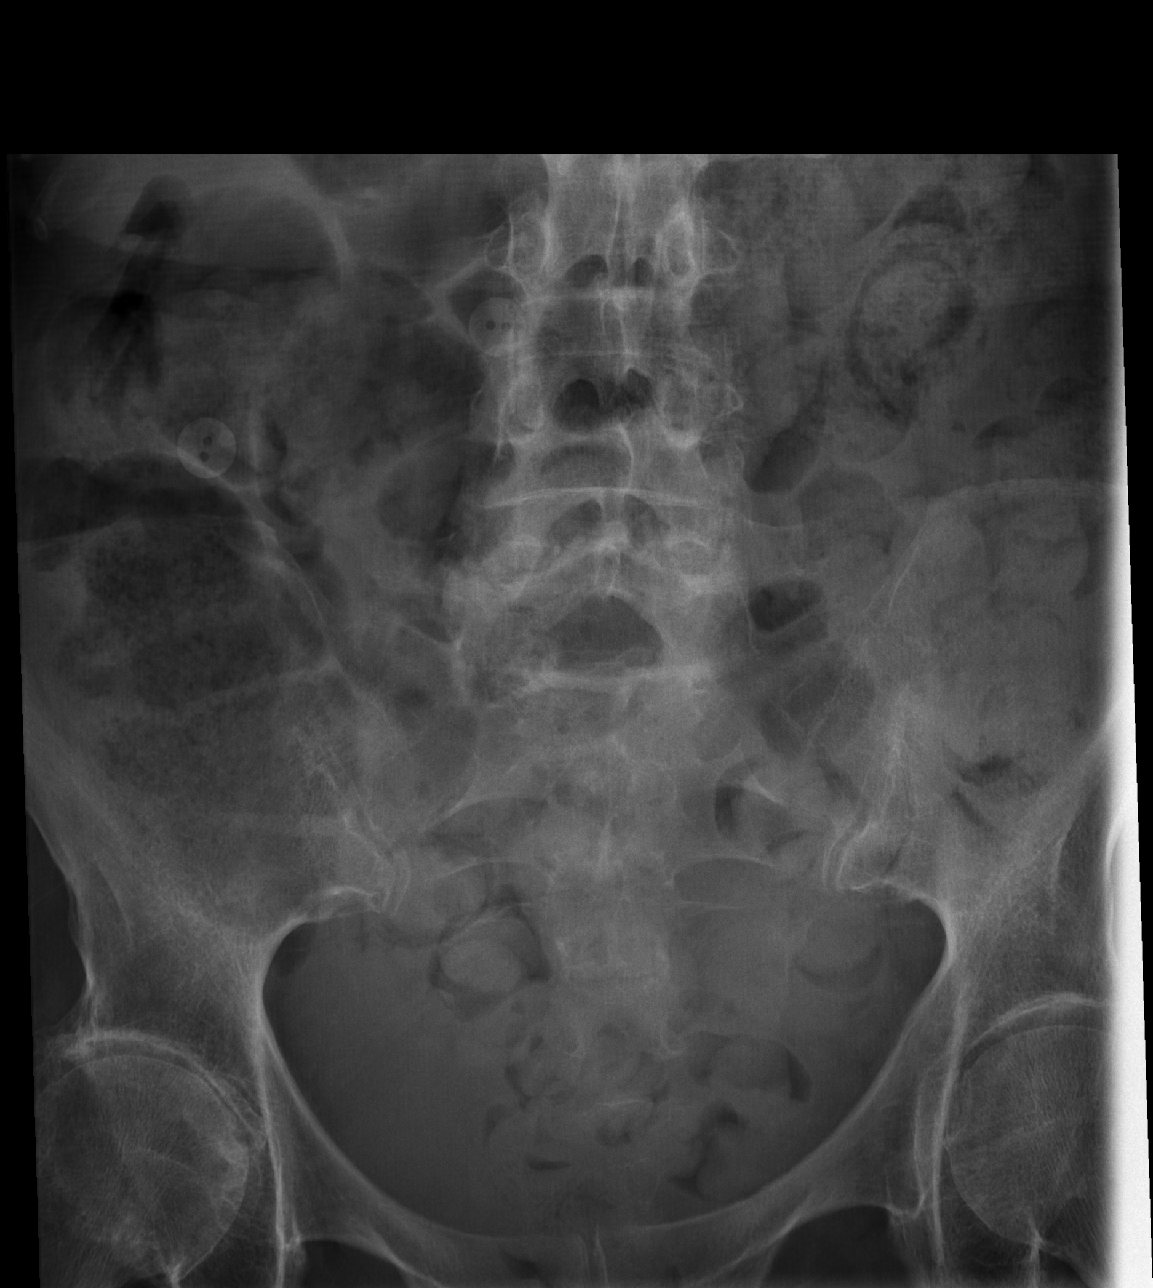

[t coccyx ap]
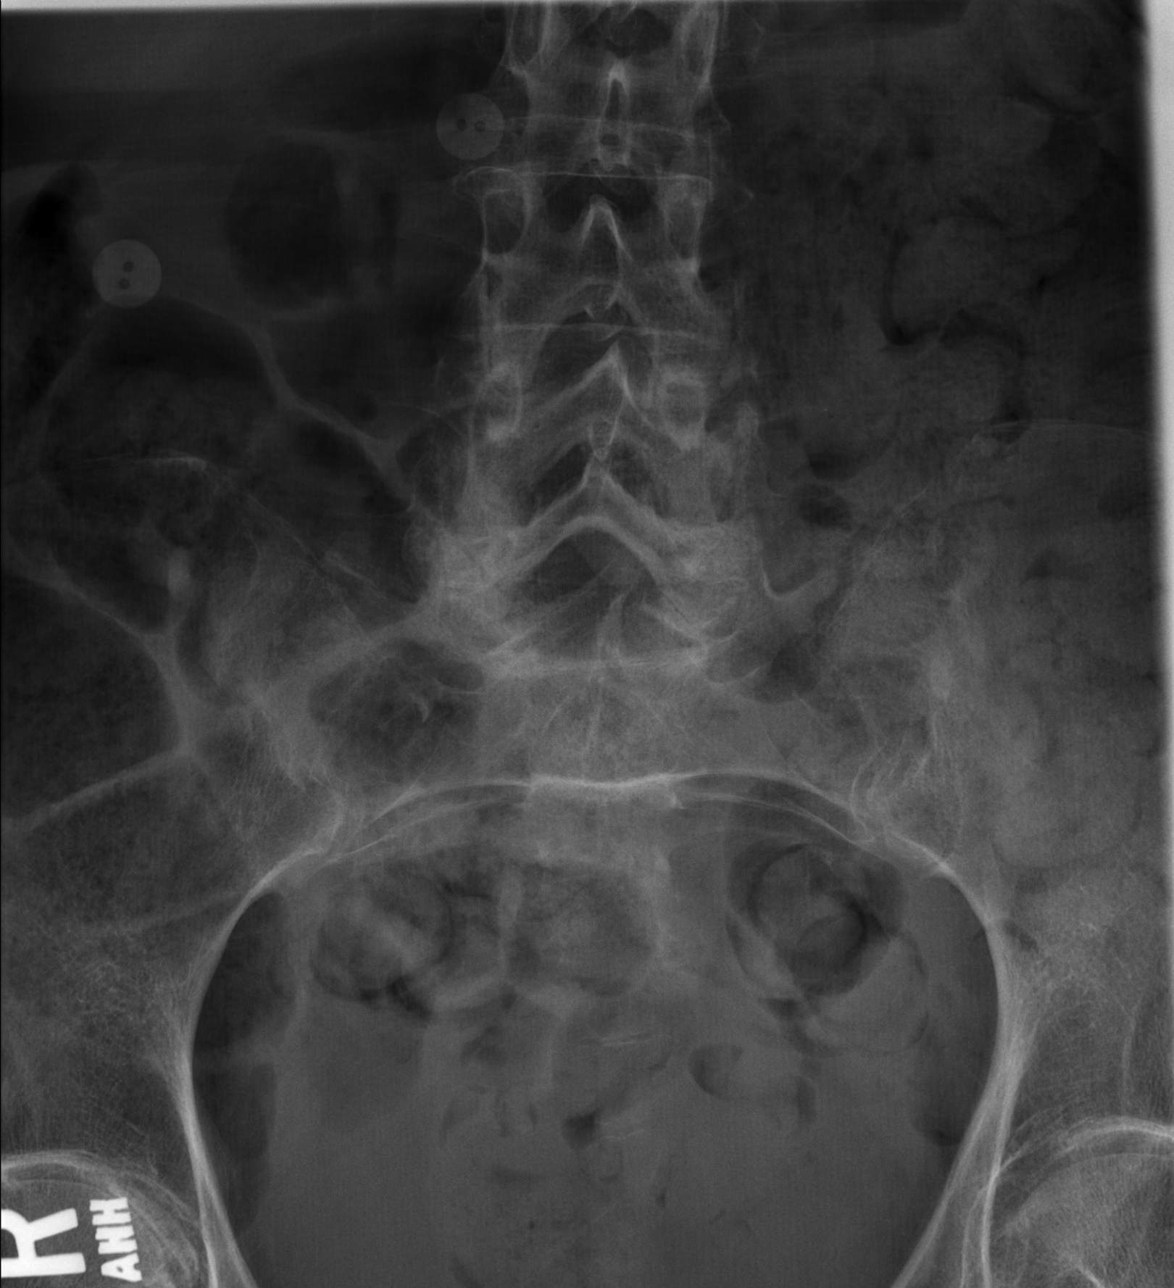

[t sacrum coccyx lat]
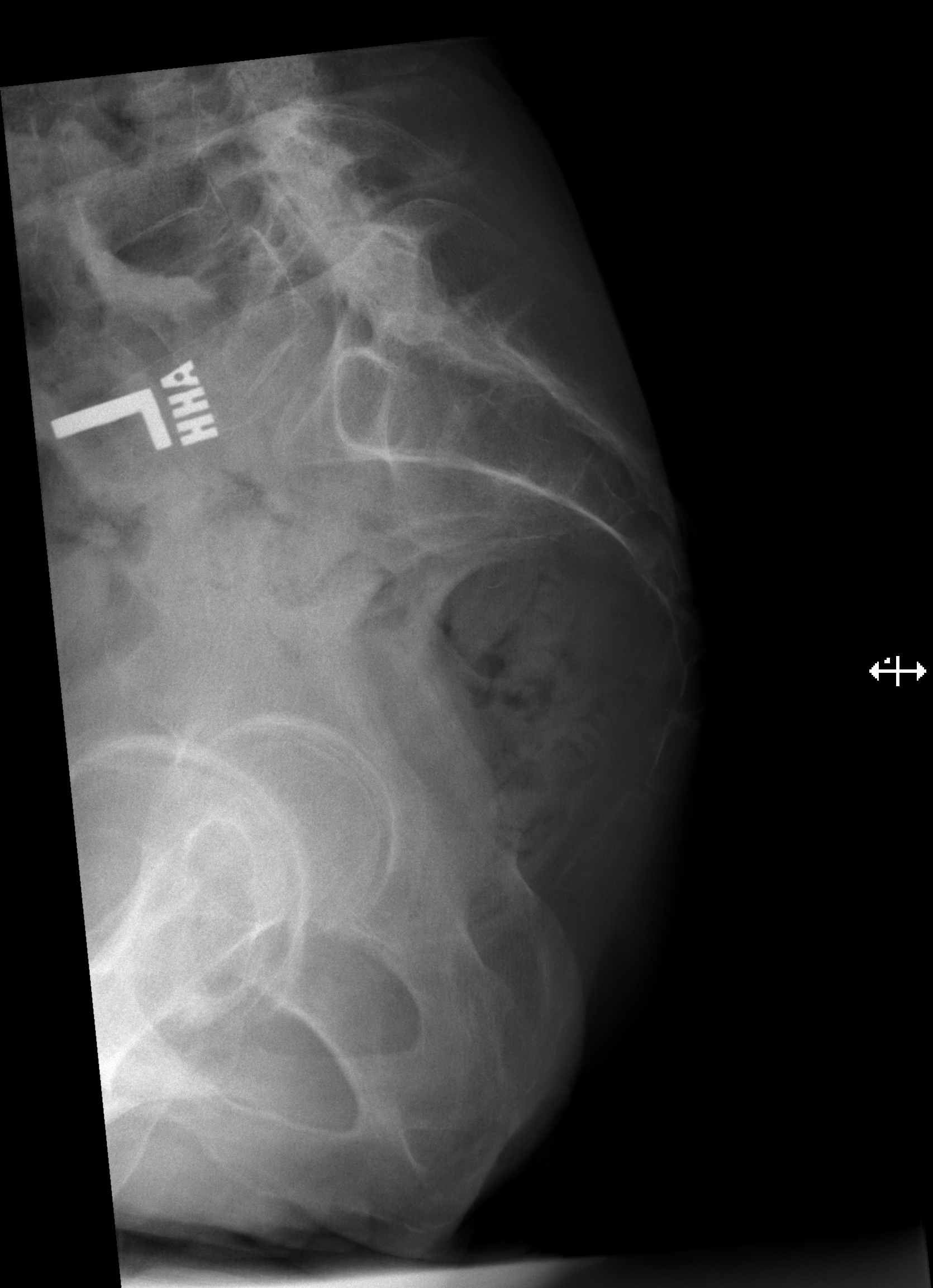

[3 of 3 positions shown; findings below may reference images not displayed]

FINDINGS: Degenerative changes scratched it diffuse osteopenia. Degenerative
changes lumbar spine and both hips. No definite displaced fractures
identified. Normal sacral alignment..
IMPRESSION: Diffuse osteopenia and degenerative change. No definite sacral
fracture identified.
